# Patient Record
Sex: Female | Born: 1985
Health system: Southern US, Community
[De-identification: ages and names within clinical notes are randomized; demographics above are authoritative.]

## PROBLEM LIST (undated history)

## (undated) DIAGNOSIS — K5909 Other constipation: Secondary | ICD-10-CM

## (undated) DIAGNOSIS — E236 Other disorders of pituitary gland: Secondary | ICD-10-CM

## (undated) DIAGNOSIS — O021 Missed abortion: Secondary | ICD-10-CM

## (undated) DIAGNOSIS — T753XXA Motion sickness, initial encounter: Secondary | ICD-10-CM

## (undated) DIAGNOSIS — E559 Vitamin D deficiency, unspecified: Secondary | ICD-10-CM

## (undated) DIAGNOSIS — R109 Unspecified abdominal pain: Secondary | ICD-10-CM

## (undated) DIAGNOSIS — G8929 Other chronic pain: Secondary | ICD-10-CM

## (undated) DIAGNOSIS — D219 Benign neoplasm of connective and other soft tissue, unspecified: Secondary | ICD-10-CM

## (undated) DIAGNOSIS — E669 Obesity, unspecified: Secondary | ICD-10-CM

## (undated) DIAGNOSIS — R519 Headache, unspecified: Secondary | ICD-10-CM

## (undated) DIAGNOSIS — N840 Polyp of corpus uteri: Secondary | ICD-10-CM

## (undated) DIAGNOSIS — E538 Deficiency of other specified B group vitamins: Secondary | ICD-10-CM

## (undated) DIAGNOSIS — K589 Irritable bowel syndrome without diarrhea: Secondary | ICD-10-CM

## (undated) DIAGNOSIS — R51 Headache: Secondary | ICD-10-CM

## (undated) DIAGNOSIS — Z6841 Body Mass Index (BMI) 40.0 and over, adult: Secondary | ICD-10-CM

## (undated) DIAGNOSIS — M549 Dorsalgia, unspecified: Secondary | ICD-10-CM

## (undated) HISTORY — PX: COLONOSCOPY: SHX174

## (undated) HISTORY — DX: Obesity, unspecified: E66.9

## (undated) HISTORY — DX: Unspecified abdominal pain: R10.9

## (undated) HISTORY — DX: Benign neoplasm of connective and other soft tissue, unspecified: D21.9

## (undated) HISTORY — DX: Other chronic pain: G89.29

## (undated) HISTORY — DX: Missed abortion: O02.1

## (undated) HISTORY — DX: Irritable bowel syndrome, unspecified: K58.9

## (undated) HISTORY — PX: TONSILLECTOMY: SUR1361

## (undated) HISTORY — DX: Other constipation: K59.09

---

## 2005-12-23 ENCOUNTER — Emergency Department: Payer: Self-pay | Admitting: Emergency Medicine

## 2009-07-21 ENCOUNTER — Emergency Department: Payer: Self-pay | Admitting: Emergency Medicine

## 2010-04-25 ENCOUNTER — Ambulatory Visit: Payer: Self-pay | Admitting: Internal Medicine

## 2011-06-27 ENCOUNTER — Ambulatory Visit: Payer: Self-pay | Admitting: Internal Medicine

## 2011-11-20 ENCOUNTER — Ambulatory Visit: Payer: Self-pay

## 2013-09-18 HISTORY — PX: TONSILLECTOMY: SUR1361

## 2013-11-06 ENCOUNTER — Ambulatory Visit: Payer: Self-pay | Admitting: Internal Medicine

## 2013-12-18 DIAGNOSIS — M7750 Other enthesopathy of unspecified foot: Secondary | ICD-10-CM | POA: Insufficient documentation

## 2013-12-18 DIAGNOSIS — M722 Plantar fascial fibromatosis: Secondary | ICD-10-CM | POA: Insufficient documentation

## 2013-12-18 DIAGNOSIS — M79673 Pain in unspecified foot: Secondary | ICD-10-CM | POA: Insufficient documentation

## 2014-02-19 DIAGNOSIS — R519 Headache, unspecified: Secondary | ICD-10-CM | POA: Insufficient documentation

## 2014-02-19 DIAGNOSIS — E236 Other disorders of pituitary gland: Secondary | ICD-10-CM | POA: Insufficient documentation

## 2014-02-19 DIAGNOSIS — R51 Headache: Secondary | ICD-10-CM

## 2014-02-19 DIAGNOSIS — E237 Disorder of pituitary gland, unspecified: Secondary | ICD-10-CM | POA: Insufficient documentation

## 2014-03-13 ENCOUNTER — Ambulatory Visit: Payer: Self-pay | Admitting: Unknown Physician Specialty

## 2014-04-29 DIAGNOSIS — R5383 Other fatigue: Secondary | ICD-10-CM | POA: Insufficient documentation

## 2014-04-29 DIAGNOSIS — E236 Other disorders of pituitary gland: Secondary | ICD-10-CM | POA: Insufficient documentation

## 2014-04-29 DIAGNOSIS — E221 Hyperprolactinemia: Secondary | ICD-10-CM | POA: Insufficient documentation

## 2014-04-29 DIAGNOSIS — D649 Anemia, unspecified: Secondary | ICD-10-CM | POA: Insufficient documentation

## 2014-04-29 DIAGNOSIS — E538 Deficiency of other specified B group vitamins: Secondary | ICD-10-CM | POA: Insufficient documentation

## 2014-10-06 DIAGNOSIS — J019 Acute sinusitis, unspecified: Secondary | ICD-10-CM | POA: Diagnosis not present

## 2014-10-06 DIAGNOSIS — E559 Vitamin D deficiency, unspecified: Secondary | ICD-10-CM | POA: Diagnosis not present

## 2014-10-06 DIAGNOSIS — D519 Vitamin B12 deficiency anemia, unspecified: Secondary | ICD-10-CM | POA: Diagnosis not present

## 2014-10-29 DIAGNOSIS — E236 Other disorders of pituitary gland: Secondary | ICD-10-CM | POA: Diagnosis not present

## 2014-10-29 DIAGNOSIS — E237 Disorder of pituitary gland, unspecified: Secondary | ICD-10-CM | POA: Diagnosis not present

## 2014-12-22 DIAGNOSIS — D519 Vitamin B12 deficiency anemia, unspecified: Secondary | ICD-10-CM | POA: Diagnosis not present

## 2014-12-22 DIAGNOSIS — M545 Low back pain: Secondary | ICD-10-CM | POA: Diagnosis not present

## 2014-12-22 DIAGNOSIS — N390 Urinary tract infection, site not specified: Secondary | ICD-10-CM | POA: Diagnosis not present

## 2014-12-22 DIAGNOSIS — R5383 Other fatigue: Secondary | ICD-10-CM | POA: Diagnosis not present

## 2014-12-22 DIAGNOSIS — E559 Vitamin D deficiency, unspecified: Secondary | ICD-10-CM | POA: Diagnosis not present

## 2014-12-22 DIAGNOSIS — R7301 Impaired fasting glucose: Secondary | ICD-10-CM | POA: Diagnosis not present

## 2015-03-25 DIAGNOSIS — Z113 Encounter for screening for infections with a predominantly sexual mode of transmission: Secondary | ICD-10-CM | POA: Diagnosis not present

## 2015-03-25 DIAGNOSIS — B9689 Other specified bacterial agents as the cause of diseases classified elsewhere: Secondary | ICD-10-CM | POA: Diagnosis not present

## 2015-03-25 DIAGNOSIS — N760 Acute vaginitis: Secondary | ICD-10-CM | POA: Diagnosis not present

## 2015-03-26 DIAGNOSIS — R10817 Generalized abdominal tenderness: Secondary | ICD-10-CM | POA: Diagnosis not present

## 2015-03-26 DIAGNOSIS — N390 Urinary tract infection, site not specified: Secondary | ICD-10-CM | POA: Diagnosis not present

## 2015-03-26 DIAGNOSIS — N760 Acute vaginitis: Secondary | ICD-10-CM | POA: Diagnosis not present

## 2015-05-17 DIAGNOSIS — D352 Benign neoplasm of pituitary gland: Secondary | ICD-10-CM | POA: Diagnosis not present

## 2015-05-17 DIAGNOSIS — M5442 Lumbago with sciatica, left side: Secondary | ICD-10-CM | POA: Diagnosis not present

## 2015-05-17 DIAGNOSIS — M5432 Sciatica, left side: Secondary | ICD-10-CM | POA: Diagnosis not present

## 2015-05-18 ENCOUNTER — Other Ambulatory Visit: Payer: Self-pay | Admitting: Nurse Practitioner

## 2015-05-18 ENCOUNTER — Ambulatory Visit
Admission: RE | Admit: 2015-05-18 | Discharge: 2015-05-18 | Disposition: A | Discharge status: Home / Self Care | Payer: Medicare Other | Source: Ambulatory Visit | Attending: Nurse Practitioner | Admitting: Nurse Practitioner

## 2015-05-18 DIAGNOSIS — M549 Dorsalgia, unspecified: Secondary | ICD-10-CM

## 2015-05-18 DIAGNOSIS — M4050 Lordosis, unspecified, site unspecified: Secondary | ICD-10-CM | POA: Diagnosis not present

## 2015-05-18 DIAGNOSIS — R5383 Other fatigue: Secondary | ICD-10-CM | POA: Diagnosis not present

## 2015-05-18 DIAGNOSIS — E559 Vitamin D deficiency, unspecified: Secondary | ICD-10-CM | POA: Diagnosis not present

## 2015-05-18 DIAGNOSIS — M545 Low back pain: Secondary | ICD-10-CM | POA: Diagnosis not present

## 2015-05-18 DIAGNOSIS — D519 Vitamin B12 deficiency anemia, unspecified: Secondary | ICD-10-CM | POA: Diagnosis not present

## 2015-06-15 DIAGNOSIS — E559 Vitamin D deficiency, unspecified: Secondary | ICD-10-CM | POA: Diagnosis not present

## 2015-06-15 DIAGNOSIS — D519 Vitamin B12 deficiency anemia, unspecified: Secondary | ICD-10-CM | POA: Diagnosis not present

## 2015-06-15 DIAGNOSIS — M545 Low back pain: Secondary | ICD-10-CM | POA: Diagnosis not present

## 2015-06-15 DIAGNOSIS — M5442 Lumbago with sciatica, left side: Secondary | ICD-10-CM | POA: Diagnosis not present

## 2015-06-15 DIAGNOSIS — Z23 Encounter for immunization: Secondary | ICD-10-CM | POA: Diagnosis not present

## 2015-06-16 DIAGNOSIS — Z113 Encounter for screening for infections with a predominantly sexual mode of transmission: Secondary | ICD-10-CM | POA: Diagnosis not present

## 2015-06-16 DIAGNOSIS — Z131 Encounter for screening for diabetes mellitus: Secondary | ICD-10-CM | POA: Diagnosis not present

## 2015-06-16 DIAGNOSIS — Z01419 Encounter for gynecological examination (general) (routine) without abnormal findings: Secondary | ICD-10-CM | POA: Diagnosis not present

## 2015-06-16 DIAGNOSIS — N9089 Other specified noninflammatory disorders of vulva and perineum: Secondary | ICD-10-CM | POA: Diagnosis not present

## 2015-06-16 DIAGNOSIS — Z13220 Encounter for screening for lipoid disorders: Secondary | ICD-10-CM | POA: Diagnosis not present

## 2015-06-16 LAB — HM PAP SMEAR

## 2015-07-20 DIAGNOSIS — J019 Acute sinusitis, unspecified: Secondary | ICD-10-CM | POA: Diagnosis not present

## 2015-07-20 DIAGNOSIS — R05 Cough: Secondary | ICD-10-CM | POA: Diagnosis not present

## 2015-07-20 DIAGNOSIS — D519 Vitamin B12 deficiency anemia, unspecified: Secondary | ICD-10-CM | POA: Diagnosis not present

## 2015-07-20 DIAGNOSIS — Z0001 Encounter for general adult medical examination with abnormal findings: Secondary | ICD-10-CM | POA: Diagnosis not present

## 2015-09-13 ENCOUNTER — Emergency Department
Admission: EM | Admit: 2015-09-13 | Discharge: 2015-09-13 | Disposition: A | Discharge status: Home / Self Care | Payer: Medicare Other | Attending: Emergency Medicine | Admitting: Emergency Medicine

## 2015-09-13 ENCOUNTER — Encounter: Payer: Self-pay | Admitting: Emergency Medicine

## 2015-09-13 DIAGNOSIS — K0889 Other specified disorders of teeth and supporting structures: Secondary | ICD-10-CM | POA: Insufficient documentation

## 2015-09-13 DIAGNOSIS — K029 Dental caries, unspecified: Secondary | ICD-10-CM | POA: Diagnosis not present

## 2015-09-13 MED ORDER — IBUPROFEN 800 MG PO TABS
800.0000 mg | ORAL_TABLET | Freq: Once | ORAL | Status: AC
  Administered 2015-09-13: 800 mg via ORAL
  Filled 2015-09-13: qty 1

## 2015-09-13 MED ORDER — AMOXICILLIN 500 MG PO CAPS: 500.0000 mg | ORAL_CAPSULE | Freq: Three times a day (TID) | ORAL | Status: DC

## 2015-09-13 MED ORDER — TRAMADOL HCL 50 MG PO TABS
50.0000 mg | ORAL_TABLET | Freq: Once | ORAL | Status: AC
  Administered 2015-09-13: 50 mg via ORAL
  Filled 2015-09-13: qty 1

## 2015-09-13 MED ORDER — OXYCODONE-ACETAMINOPHEN 5-325 MG PO TABS: 1.0000 | ORAL_TABLET | Freq: Four times a day (QID) | ORAL | Status: DC | PRN

## 2015-09-13 MED ORDER — IBUPROFEN 800 MG PO TABS: 800.0000 mg | ORAL_TABLET | Freq: Three times a day (TID) | ORAL | Status: DC | PRN

## 2015-09-13 NOTE — ED Notes (Signed)
Pt reports toothache to left upper jaw since last pm.

## 2015-09-13 NOTE — ED Notes (Signed)
Pt has left upper tooth pain.   Sx began yesterday.  Pt has swelling to left side of face.  No resp distress.

## 2015-09-13 NOTE — ED Provider Notes (Signed)
[facility] Emergency Department Provider Note  ____________________________________________  Time seen: Approximately 3:19 PM  I have reviewed the triage vital signs and the nursing notes.   HISTORY  Chief Complaint Dental Pain    HPI Shannon Foley is a 29 y.o. female patient complaining of dental pain which started yesterday. She awakened this morning no some mild swelling to the left side of her face. She states she called her dentist received a recorded message saying they were not open up to 09/20/2015. Patient rated her pain discomfort as a 7/10. No palliative measures taken for this complaint.  No past medical history on file.  There are no active problems to display for this patient.   No past surgical history on file.  Current Outpatient Rx  Name  Route  Sig  Dispense  Refill  . amoxicillin (AMOXIL) 500 MG capsule   Oral   Take 1 capsule (500 mg total) by mouth 3 (three) times daily.   30 capsule   0   . ibuprofen (ADVIL,MOTRIN) 800 MG tablet   Oral   Take 1 tablet (800 mg total) by mouth every 8 (eight) hours as needed for moderate pain.   15 tablet   0   . oxyCODONE-acetaminophen (ROXICET) 5-325 MG tablet   Oral   Take 1 tablet by mouth every 6 (six) hours as needed for moderate pain.   12 tablet   0     Allergies Review of patient's allergies indicates no known allergies.  No family history on file.  Social History Social History  Substance Use Topics  . Smoking status: Never Smoker   . Smokeless tobacco: Not on file  . Alcohol Use: No    Review of Systems Constitutional: No fever/chills Eyes: No visual changes. ENT: No sore throat. Dental pain Cardiovascular: Denies chest pain. Respiratory: Denies shortness of breath. Gastrointestinal: No abdominal pain.  No nausea, no vomiting.  No diarrhea.  No constipation. Genitourinary: Negative for dysuria. Musculoskeletal: Negative for back pain. Skin: Negative for  rash. Neurological: Negative for headaches, focal weakness or numbness. 10-point ROS otherwise negative.  ____________________________________________   PHYSICAL EXAM:  VITAL SIGNS: ED Triage Vitals  Enc Vitals Group     BP 09/13/15 1442 132/90 mmHg     Pulse Rate 09/13/15 1442 84     Resp 09/13/15 1442 20     Temp 09/13/15 1442 98 F (36.7 C)     Temp Source 09/13/15 1442 Oral     SpO2 09/13/15 1442 100 %     Weight 09/13/15 1442 210 lb (95.255 kg)     Height 09/13/15 1442 5\' 2"  (1.575 m)     Head Cir --      Peak Flow --      Pain Score 09/13/15 1443 7     Pain Loc --      Pain Edu? --      Excl. in [place]? --     Constitutional: Alert and oriented. Well appearing and in no acute distress. Eyes: Conjunctivae are normal. PERRL. EOMI. Head: Atraumatic. Nose: No congestion/rhinnorhea. Mouth/Throat: Mucous membranes are moist.  Oropharynx non-erythematous. Edematous gingiva at tooth #12. Neck: No stridor.  No cervical spine tenderness to palpation. Hematological/Lymphatic/Immunilogical: No cervical lymphadenopathy. Cardiovascular: Normal rate, regular rhythm. Grossly normal heart sounds.  Good peripheral circulation. Respiratory: Normal respiratory effort.  No retractions. Lungs CTAB. Gastrointestinal: Soft and nontender. No distention. No abdominal bruits. No CVA tenderness. Musculoskeletal: No lower extremity tenderness nor edema.  No  joint effusions. Neurologic:  Normal speech and language. No gross focal neurologic deficits are appreciated. No gait instability. Skin:  Skin is warm, dry and intact. No rash noted. Psychiatric: Mood and affect are normal. Speech and behavior are normal.  ____________________________________________   LABS (all labs ordered are listed, but only abnormal results are displayed)  Labs Reviewed - No data to  display ____________________________________________  EKG   ____________________________________________  RADIOLOGY   ____________________________________________   PROCEDURES  Procedure(s) performed: None  Critical Care performed: No  ____________________________________________   INITIAL IMPRESSION / ASSESSMENT AND PLAN / ED COURSE  Pertinent labs & imaging results that were available during my care of the patient were reviewed by me and considered in my medical decision making (see chart for details).  Dental pain left upper incisor. Patient will follow-up with her dentist next week. Patient given a prescription for amoxicillin, tramadol and ibuprofen. Patient given a work note for today.  ____________________________________________   FINAL CLINICAL IMPRESSION(S) / ED DIAGNOSES  Final diagnoses:  Pain due to dental caries      Sable Feil, PA-C 09/13/15 1536  Lavonia Drafts, MD 09/14/15 [phone]

## 2015-09-13 NOTE — Discharge Instructions (Signed)
Dental Caries °Dental caries is tooth decay. This decay can cause a hole in teeth (cavity) that can get bigger and deeper over time. °HOME CARE °· Brush and floss your teeth. Do this at least two times a day. °· Use a fluoride toothpaste. °· Use a mouth rinse if told by your dentist or doctor. °· Eat less sugary and starchy foods. Drink less sugary drinks. °· Avoid snacking often on sugary and starchy foods. Avoid sipping often on sugary drinks. °· Keep regular checkups and cleanings with your dentist. °· Use fluoride supplements if told by your dentist or doctor. °· Allow fluoride to be applied to teeth if told by your dentist or doctor. °  °This information is not intended to replace advice given to you by your health care provider. Make sure you discuss any questions you have with your health care provider. °  °Document Released: 06/13/2008 Document Revised: 09/25/2014 Document Reviewed: 09/06/2012 °Elsevier Interactive Patient Education ©2016 [employer] ° °

## 2015-09-19 HISTORY — PX: TONSILLECTOMY: SUR1361

## 2015-09-21 DIAGNOSIS — J069 Acute upper respiratory infection, unspecified: Secondary | ICD-10-CM | POA: Diagnosis not present

## 2015-10-12 DIAGNOSIS — R109 Unspecified abdominal pain: Secondary | ICD-10-CM | POA: Diagnosis not present

## 2015-10-12 DIAGNOSIS — D352 Benign neoplasm of pituitary gland: Secondary | ICD-10-CM | POA: Diagnosis not present

## 2015-10-12 DIAGNOSIS — N390 Urinary tract infection, site not specified: Secondary | ICD-10-CM | POA: Diagnosis not present

## 2015-11-10 DIAGNOSIS — R7301 Impaired fasting glucose: Secondary | ICD-10-CM | POA: Diagnosis not present

## 2015-11-10 DIAGNOSIS — N390 Urinary tract infection, site not specified: Secondary | ICD-10-CM | POA: Diagnosis not present

## 2015-11-10 DIAGNOSIS — R10816 Epigastric abdominal tenderness: Secondary | ICD-10-CM | POA: Diagnosis not present

## 2015-11-10 DIAGNOSIS — J1089 Influenza due to other identified influenza virus with other manifestations: Secondary | ICD-10-CM | POA: Diagnosis not present

## 2015-11-11 DIAGNOSIS — R10817 Generalized abdominal tenderness: Secondary | ICD-10-CM | POA: Diagnosis not present

## 2015-11-11 DIAGNOSIS — R7301 Impaired fasting glucose: Secondary | ICD-10-CM | POA: Diagnosis not present

## 2015-11-17 DIAGNOSIS — R10816 Epigastric abdominal tenderness: Secondary | ICD-10-CM | POA: Diagnosis not present

## 2015-12-24 DIAGNOSIS — R5383 Other fatigue: Secondary | ICD-10-CM | POA: Diagnosis not present

## 2015-12-24 DIAGNOSIS — D352 Benign neoplasm of pituitary gland: Secondary | ICD-10-CM | POA: Diagnosis not present

## 2015-12-24 DIAGNOSIS — E237 Disorder of pituitary gland, unspecified: Secondary | ICD-10-CM | POA: Diagnosis not present

## 2015-12-24 DIAGNOSIS — Z79899 Other long term (current) drug therapy: Secondary | ICD-10-CM | POA: Diagnosis not present

## 2015-12-24 DIAGNOSIS — E236 Other disorders of pituitary gland: Secondary | ICD-10-CM | POA: Diagnosis not present

## 2015-12-25 ENCOUNTER — Encounter: Payer: Self-pay | Admitting: Medical Oncology

## 2015-12-25 ENCOUNTER — Emergency Department
Admission: EM | Admit: 2015-12-25 | Discharge: 2015-12-25 | Disposition: A | Discharge status: Home / Self Care | Payer: Medicare Other | Attending: Emergency Medicine | Admitting: Emergency Medicine

## 2015-12-25 DIAGNOSIS — R1084 Generalized abdominal pain: Secondary | ICD-10-CM

## 2015-12-25 DIAGNOSIS — R197 Diarrhea, unspecified: Secondary | ICD-10-CM | POA: Insufficient documentation

## 2015-12-25 HISTORY — DX: Other disorders of pituitary gland: E23.6

## 2015-12-25 LAB — COMPREHENSIVE METABOLIC PANEL
ALK PHOS: 78 U/L (ref 38–126)
ALT: 10 U/L — ABNORMAL LOW (ref 14–54)
ANION GAP: 6 (ref 5–15)
AST: 21 U/L (ref 15–41)
Albumin: 4.2 g/dL (ref 3.5–5.0)
BILIRUBIN TOTAL: 0.5 mg/dL (ref 0.3–1.2)
BUN: 10 mg/dL (ref 6–20)
CALCIUM: 8.4 mg/dL — AB (ref 8.9–10.3)
CO2: 22 mmol/L (ref 22–32)
Chloride: 101 mmol/L (ref 101–111)
Creatinine, Ser: 0.71 mg/dL (ref 0.44–1.00)
Glucose, Bld: 134 mg/dL — ABNORMAL HIGH (ref 65–99)
Potassium: 3.8 mmol/L (ref 3.5–5.1)
Sodium: 129 mmol/L — ABNORMAL LOW (ref 135–145)
TOTAL PROTEIN: 8.1 g/dL (ref 6.5–8.1)

## 2015-12-25 LAB — CBC
HCT: 43.2 % (ref 35.0–47.0)
HEMOGLOBIN: 14.7 g/dL (ref 12.0–16.0)
MCH: 30.5 pg (ref 26.0–34.0)
MCHC: 34 g/dL (ref 32.0–36.0)
MCV: 89.8 fL (ref 80.0–100.0)
Platelets: 369 10*3/uL (ref 150–440)
RBC: 4.81 MIL/uL (ref 3.80–5.20)
RDW: 13.9 % (ref 11.5–14.5)
WBC: 11.4 10*3/uL — AB (ref 3.6–11.0)

## 2015-12-25 LAB — URINALYSIS COMPLETE WITH MICROSCOPIC (ARMC ONLY)
Bacteria, UA: NONE SEEN
Bilirubin Urine: NEGATIVE
Glucose, UA: NEGATIVE mg/dL
Hgb urine dipstick: NEGATIVE
KETONES UR: NEGATIVE mg/dL
NITRITE: NEGATIVE
PROTEIN: NEGATIVE mg/dL
SPECIFIC GRAVITY, URINE: 1.019 (ref 1.005–1.030)
pH: 6 (ref 5.0–8.0)

## 2015-12-25 LAB — POCT PREGNANCY, URINE: Preg Test, Ur: NEGATIVE

## 2015-12-25 LAB — LIPASE, BLOOD: Lipase: 15 U/L (ref 11–51)

## 2015-12-25 MED ORDER — DICYCLOMINE HCL 20 MG PO TABS: 20.0000 mg | ORAL_TABLET | Freq: Three times a day (TID) | ORAL | Status: DC | PRN

## 2015-12-25 MED ORDER — RANITIDINE HCL 150 MG PO CAPS: 150.0000 mg | ORAL_CAPSULE | Freq: Two times a day (BID) | ORAL | Status: DC

## 2015-12-25 MED ORDER — SUCRALFATE 1 G PO TABS: 1.0000 g | ORAL_TABLET | Freq: Four times a day (QID) | ORAL | Status: DC

## 2015-12-25 NOTE — Discharge Instructions (Signed)
Abdominal Pain, Adult °Many things can cause abdominal pain. Usually, abdominal pain is not caused by a disease and will improve without treatment. It can often be observed and treated at home. Your health care provider will do a physical exam and possibly order blood tests and X-rays to help determine the seriousness of your pain. However, in many cases, more time must pass before a clear cause of the pain can be found. Before that point, your health care provider may not know if you need more testing or further treatment. °HOME CARE INSTRUCTIONS °Monitor your abdominal pain for any changes. The following actions may help to alleviate any discomfort you are experiencing: °· Only take over-the-counter or prescription medicines as directed by your health care provider. °· Do not take laxatives unless directed to do so by your health care provider. °· Try a clear liquid diet (broth, tea, or water) as directed by your health care provider. Slowly move to a bland diet as tolerated. °SEEK MEDICAL CARE IF: °· You have unexplained abdominal pain. °· You have abdominal pain associated with nausea or diarrhea. °· You have pain when you urinate or have a bowel movement. °· You experience abdominal pain that wakes you in the night. °· You have abdominal pain that is worsened or improved by eating food. °· You have abdominal pain that is worsened with eating fatty foods. °· You have a fever. °SEEK IMMEDIATE MEDICAL CARE IF: °· Your pain does not go away within 2 hours. °· You keep throwing up (vomiting). °· Your pain is felt only in portions of the abdomen, such as the right side or the left lower portion of the abdomen. °· You pass bloody or black tarry stools. °MAKE SURE YOU: °· Understand these instructions. °· Will watch your condition. °· Will get help right away if you are not doing well or get worse. °  °This information is not intended to replace advice given to you by your health care provider. Make sure you discuss  any questions you have with your health care provider. °  °Document Released: 06/14/2005 Document Revised: 05/26/2015 Document Reviewed: 05/14/2013 °Elsevier Interactive Patient Education ©2016 [employer] ° °Diarrhea °Diarrhea is frequent loose and watery bowel movements. It can cause you to feel weak and dehydrated. Dehydration can cause you to become tired and thirsty, have a dry mouth, and have decreased urination that often is dark yellow. Diarrhea is a sign of another problem, most often an infection that will not last long. In most cases, diarrhea typically lasts 2-3 days. However, it can last longer if it is a sign of something more serious. It is important to treat your diarrhea as directed by your caregiver to lessen or prevent future episodes of diarrhea. °CAUSES  °Some common causes include: °· Gastrointestinal infections caused by viruses, bacteria, or parasites. °· Food poisoning or food allergies. °· Certain medicines, such as antibiotics, chemotherapy, and laxatives. °· Artificial sweeteners and fructose. °· Digestive disorders. °HOME CARE INSTRUCTIONS °· Ensure adequate fluid intake (hydration): Have 1 cup (8 oz) of fluid for each diarrhea episode. Avoid fluids that contain simple sugars or sports drinks, fruit juices, whole milk products, and sodas. Your urine should be clear or pale yellow if you are drinking enough fluids. Hydrate with an oral rehydration solution that you can purchase at pharmacies, retail stores, and online. You can prepare an oral rehydration solution at home by mixing the following ingredients together: °¨  - tsp table salt. °¨ ¾ tsp baking soda. °¨    tsp salt substitute containing potassium chloride. °¨ 1  tablespoons sugar. °¨ 1 L (34 oz) of water. °· Certain foods and beverages may increase the speed at which food moves through the gastrointestinal (GI) tract. These foods and beverages should be avoided and include: °¨ Caffeinated and alcoholic beverages. °¨ High-fiber  foods, such as raw fruits and vegetables, nuts, seeds, and whole grain breads and cereals. °¨ Foods and beverages sweetened with sugar alcohols, such as xylitol, sorbitol, and mannitol. °· Some foods may be well tolerated and may help thicken stool including: °¨ Starchy foods, such as rice, toast, pasta, low-sugar cereal, oatmeal, grits, baked potatoes, crackers, and bagels. °¨ Bananas. °¨ Applesauce. °· Add probiotic-rich foods to help increase healthy bacteria in the GI tract, such as yogurt and fermented milk products. °· Wash your hands well after each diarrhea episode. °· Only take over-the-counter or prescription medicines as directed by your caregiver. °· Take a warm bath to relieve any burning or pain from frequent diarrhea episodes. °SEEK IMMEDIATE MEDICAL CARE IF:  °· You are unable to keep fluids down. °· You have persistent vomiting. °· You have blood in your stool, or your stools are black and tarry. °· You do not urinate in 6-8 hours, or there is only a small amount of very dark urine. °· You have abdominal pain that increases or localizes. °· You have weakness, dizziness, confusion, or light-headedness. °· You have a severe headache. °· Your diarrhea gets worse or does not get better. °· You have a fever or persistent symptoms for more than 2-3 days. °· You have a fever and your symptoms suddenly get worse. °MAKE SURE YOU:  °· Understand these instructions. °· Will watch your condition. °· Will get help right away if you are not doing well or get worse. °  °This information is not intended to replace advice given to you by your health care provider. Make sure you discuss any questions you have with your health care provider. °  °Document Released: 08/25/2002 Document Revised: 09/25/2014 Document Reviewed: 05/12/2012 °Elsevier Interactive Patient Education ©2016 [employer] Inc. ° °

## 2015-12-25 NOTE — ED Notes (Signed)
Pt reports upper abd pain that began around 0400 this am, pt also reports diarrhea.

## 2015-12-25 NOTE — ED Provider Notes (Signed)
[facility] Emergency Department Provider Note  ____________________________________________  Time seen: 1205 PM  I have reviewed the triage vital signs and the nursing notes.   HISTORY  Chief Complaint Abdominal Pain and Diarrhea    HPI Shannon Foley is a 30 y.o. female who was in her usual state of health last night at bedtime when she woke up this morning around 4:00 AM with generalized abdominal pain. He is been constant and steady since that time. Radiating to the lower back. No nausea vomiting dysuria frequency urgency. Has had several loose bowel movements today. Able to eat and drink normally. No fatigue. No chest pain shortness of breath fevers chills dizziness. No cough or runny nose. Has recently been exposed to a child who had influenza.     Past Medical History  Diagnosis Date  . Pituitary cyst ([place])      There are no active problems to display for this patient.    Past Surgical History  Procedure Laterality Date  . Tonsillectomy       Current Outpatient Rx  Name  Route  Sig  Dispense  Refill  . amoxicillin (AMOXIL) 500 MG capsule   Oral   Take 1 capsule (500 mg total) by mouth 3 (three) times daily.   30 capsule   0   . dicyclomine (BENTYL) 20 MG tablet   Oral   Take 1 tablet (20 mg total) by mouth 3 (three) times daily as needed for spasms.   30 tablet   0   . ibuprofen (ADVIL,MOTRIN) 800 MG tablet   Oral   Take 1 tablet (800 mg total) by mouth every 8 (eight) hours as needed for moderate pain.   15 tablet   0   . oxyCODONE-acetaminophen (ROXICET) 5-325 MG tablet   Oral   Take 1 tablet by mouth every 6 (six) hours as needed for moderate pain.   12 tablet   0   . ranitidine (ZANTAC) 150 MG capsule   Oral   Take 1 capsule (150 mg total) by mouth 2 (two) times daily.   28 capsule   0   . sucralfate (CARAFATE) 1 g tablet   Oral   Take 1 tablet (1 g total) by mouth 4 (four) times daily.   120 tablet   1       Allergies Review of patient's allergies indicates no known allergies.   No family history on file.  Social History Social History  Substance Use Topics  . Smoking status: Never Smoker   . Smokeless tobacco: None  . Alcohol Use: No    Review of Systems  Constitutional:   No fever or chills. No weight changes Eyes:   No vision changes.  ENT:   No sore throat. No rhinorrhea. Cardiovascular:   No chest pain. Respiratory:   No dyspnea or cough. Gastrointestinal:   Generalized abdominal pain with diarrhea. No vomiting.Marland Kitchen  No BRBPR or melena. Genitourinary:   Negative for dysuria or difficulty urinating. Musculoskeletal:   Negative for focal pain or swelling Skin:   Negative for rash. Neurological:   Negative for headaches, focal weakness or numbness.  10-point ROS otherwise negative.  ____________________________________________   PHYSICAL EXAM:  VITAL SIGNS: ED Triage Vitals  Enc Vitals Group     BP 12/25/15 0953 128/78 mmHg     Pulse Rate 12/25/15 0953 99     Resp 12/25/15 0953 16     Temp 12/25/15 0953 98.2 F (36.8 C)  Temp Source 12/25/15 0953 Oral     SpO2 12/25/15 0953 100 %     Weight 12/25/15 0954 223 lb (101.152 kg)     Height 12/25/15 0954 5\' 2"  (1.575 m)     Head Cir --      Peak Flow --      Pain Score 12/25/15 0955 10     Pain Loc --      Pain Edu? --      Excl. in [place]? --     Vital signs reviewed, nursing assessments reviewed.   Constitutional:   Alert and oriented. Well appearing and in no distress. Eyes:   No scleral icterus. No conjunctival pallor. PERRL. EOMI ENT   Head:   Normocephalic and atraumatic.   Nose:   No congestion/rhinnorhea. No septal hematoma   Mouth/Throat:   MMM, mild pharyngeal erythema. No peritonsillar mass.    Neck:   No stridor. No SubQ emphysema. No meningismus. Hematological/Lymphatic/Immunilogical:   No cervical lymphadenopathy. Cardiovascular:   RRR. Symmetric bilateral radial and DP pulses.   No murmurs.  Respiratory:   Normal respiratory effort without tachypnea nor retractions. Breath sounds are clear and equal bilaterally. No wheezes/rales/rhonchi. Gastrointestinal:   Soft with mild generalized tenderness. Non distended. There is no CVA tenderness.  No rebound, rigidity, or guarding. Genitourinary:   deferred Musculoskeletal:   Nontender with normal range of motion in all extremities. No joint effusions.  No lower extremity tenderness.  No edema. There is some mild soft tissue tenderness in the right flank Neurologic:   Normal speech and language.  CN 2-10 normal. Motor grossly intact. No gross focal neurologic deficits are appreciated.  Skin:    Skin is warm, dry and intact. No rash noted.  No petechiae, purpura, or bullae. Psychiatric:   Mood and affect are normal. ____________________________________________    LABS (pertinent positives/negatives) (all labs ordered are listed, but only abnormal results are displayed) Labs Reviewed  COMPREHENSIVE METABOLIC PANEL - Abnormal; Notable for the following:    Sodium 129 (*)    Glucose, Bld 134 (*)    Calcium 8.4 (*)    ALT 10 (*)    All other components within normal limits  CBC - Abnormal; Notable for the following:    WBC 11.4 (*)    All other components within normal limits  URINALYSIS COMPLETEWITH MICROSCOPIC ([facility] ONLY) - Abnormal; Notable for the following:    Color, Urine YELLOW (*)    APPearance CLOUDY (*)    Leukocytes, UA 2+ (*)    Squamous Epithelial / LPF 6-30 (*)    All other components within normal limits  LIPASE, BLOOD  POC URINE PREG, ED  POCT PREGNANCY, URINE   ____________________________________________   EKG  Interpreted by me Normal sinus rhythm rate of 97, normal axis and intervals. Normal QRS with an incomplete right bundle-branch block. Isolated T-wave inversion in V3 which is nonspecific. Normal ST segments.  ____________________________________________     RADIOLOGY    ____________________________________________   PROCEDURES   ____________________________________________   INITIAL IMPRESSION / ASSESSMENT AND PLAN / ED COURSE  Pertinent labs & imaging results that were available during my care of the patient were reviewed by me and considered in my medical decision making (see chart for details).  Patient presents with abdominal pain and loose bowel movements for about 8 hours so far today. His exposure to child with viral illness. Well-appearing no acute distress, vital signs unremarkable, exam is nonfocal and reassuring. The very mild hyponatremia of 129  as well as a white blood cell count of 11 are all consistent with viral GI infection. These can sometimes be seen in some bacterial GI infections as well, but with her normal vital signs and nontoxic appearance, I think this is less likely. Start the patient on Bentyl Carafate and Zantac and have him follow up with primary care.     ____________________________________________   FINAL CLINICAL IMPRESSION(S) / ED DIAGNOSES  Final diagnoses:  Generalized abdominal pain  Diarrhea, unspecified type      Carrie Mew, MD 12/25/15 1233

## 2015-12-29 DIAGNOSIS — E860 Dehydration: Secondary | ICD-10-CM | POA: Diagnosis not present

## 2015-12-29 DIAGNOSIS — N390 Urinary tract infection, site not specified: Secondary | ICD-10-CM | POA: Diagnosis not present

## 2015-12-29 DIAGNOSIS — R10817 Generalized abdominal tenderness: Secondary | ICD-10-CM | POA: Diagnosis not present

## 2015-12-29 DIAGNOSIS — R4922 Hyponasality: Secondary | ICD-10-CM | POA: Diagnosis not present

## 2015-12-30 ENCOUNTER — Other Ambulatory Visit: Payer: Self-pay | Admitting: Nurse Practitioner

## 2015-12-30 DIAGNOSIS — R109 Unspecified abdominal pain: Secondary | ICD-10-CM

## 2016-01-04 ENCOUNTER — Ambulatory Visit
Admission: RE | Admit: 2016-01-04 | Discharge: 2016-01-04 | Disposition: A | Discharge status: Home / Self Care | Payer: Medicare Other | Source: Ambulatory Visit | Attending: Nurse Practitioner | Admitting: Nurse Practitioner

## 2016-01-04 DIAGNOSIS — R109 Unspecified abdominal pain: Secondary | ICD-10-CM

## 2016-01-04 DIAGNOSIS — D509 Iron deficiency anemia, unspecified: Secondary | ICD-10-CM | POA: Diagnosis not present

## 2016-01-04 DIAGNOSIS — E559 Vitamin D deficiency, unspecified: Secondary | ICD-10-CM | POA: Diagnosis not present

## 2016-01-04 DIAGNOSIS — K5900 Constipation, unspecified: Secondary | ICD-10-CM | POA: Insufficient documentation

## 2016-01-04 DIAGNOSIS — E860 Dehydration: Secondary | ICD-10-CM | POA: Diagnosis not present

## 2016-01-04 DIAGNOSIS — D259 Leiomyoma of uterus, unspecified: Secondary | ICD-10-CM | POA: Insufficient documentation

## 2016-01-04 DIAGNOSIS — R4922 Hyponasality: Secondary | ICD-10-CM | POA: Diagnosis not present

## 2016-01-04 DIAGNOSIS — R1012 Left upper quadrant pain: Secondary | ICD-10-CM | POA: Diagnosis not present

## 2016-01-04 DIAGNOSIS — R933 Abnormal findings on diagnostic imaging of other parts of digestive tract: Secondary | ICD-10-CM | POA: Diagnosis not present

## 2016-01-04 MED ORDER — IOPAMIDOL (ISOVUE-370) INJECTION 76%
100.0000 mL | Freq: Once | INTRAVENOUS | Status: AC | PRN
  Administered 2016-01-04: 100 mL via INTRAVENOUS

## 2016-01-17 DIAGNOSIS — E559 Vitamin D deficiency, unspecified: Secondary | ICD-10-CM | POA: Diagnosis not present

## 2016-01-17 DIAGNOSIS — N390 Urinary tract infection, site not specified: Secondary | ICD-10-CM | POA: Diagnosis not present

## 2016-01-17 DIAGNOSIS — K279 Peptic ulcer, site unspecified, unspecified as acute or chronic, without hemorrhage or perforation: Secondary | ICD-10-CM | POA: Diagnosis not present

## 2016-01-17 DIAGNOSIS — N760 Acute vaginitis: Secondary | ICD-10-CM | POA: Diagnosis not present

## 2016-02-02 ENCOUNTER — Other Ambulatory Visit: Payer: Self-pay

## 2016-02-02 DIAGNOSIS — E236 Other disorders of pituitary gland: Secondary | ICD-10-CM | POA: Insufficient documentation

## 2016-02-03 ENCOUNTER — Encounter (INDEPENDENT_AMBULATORY_CARE_PROVIDER_SITE_OTHER): Payer: Self-pay

## 2016-02-03 ENCOUNTER — Ambulatory Visit (INDEPENDENT_AMBULATORY_CARE_PROVIDER_SITE_OTHER): Payer: Self-pay | Admitting: Gastroenterology

## 2016-02-03 ENCOUNTER — Encounter: Payer: Self-pay | Admitting: Gastroenterology

## 2016-02-03 VITALS — BP 130/68 | HR 81 | Temp 98.5°F | Ht 62.0 in | Wt 216.0 lb

## 2016-02-03 DIAGNOSIS — R938 Abnormal findings on diagnostic imaging of other specified body structures: Secondary | ICD-10-CM | POA: Diagnosis not present

## 2016-02-03 DIAGNOSIS — R9389 Abnormal findings on diagnostic imaging of other specified body structures: Secondary | ICD-10-CM

## 2016-02-03 DIAGNOSIS — K5901 Slow transit constipation: Secondary | ICD-10-CM | POA: Diagnosis not present

## 2016-02-03 DIAGNOSIS — R935 Abnormal findings on diagnostic imaging of other abdominal regions, including retroperitoneum: Secondary | ICD-10-CM

## 2016-02-03 NOTE — Progress Notes (Signed)
Gastroenterology Consultation  Referring Provider:     [name], MD Primary Care Physician:  [name], MD Primary Gastroenterologist:  Dr. Allen Norris     Reason for Consultation:     Constipation        HPI:   Shannon Foley is a 30 y.o. y/o female referred for consultation & management of Constipation by Dr. Humphrey Rolls, Timoteo Gaul, MD.  This patient comes today with a history of constipation most of her life but she states that his gotten worse in the last 6 months. The patient was started on Linzess at 72 g once a day. The patient took it once and states she had a bowel movement with diarrhea. The patient reports that she took it with food and did not take it as suggested a half hour before she eats. The patient reports that she has a bowel movement every day but it is usually hard to pass. The patient also had imaging of her abdomen that showed her to have duodenitis. The patient has been having epigastric discomfort.  Past Medical History  Diagnosis Date  . Pituitary cyst [facility])     Past Surgical History  Procedure Laterality Date  . Tonsillectomy      Prior to Admission medications   Medication Sig Start Date End Date Taking? Authorizing Provider  cyanocobalamin (,VITAMIN B-12,) 1000 MCG/ML injection  04/14/14  Yes Historical Provider, MD  ibuprofen (ADVIL,MOTRIN) 800 MG tablet Take 1 tablet (800 mg total) by mouth every 8 (eight) hours as needed for moderate pain. 09/13/15  Yes Sable Feil, PA-C  Vitamin D, Ergocalciferol, (DRISDOL) 50000 units CAPS capsule  04/14/14  Yes Historical Provider, MD  amoxicillin (AMOXIL) 500 MG capsule Take 1 capsule (500 mg total) by mouth 3 (three) times daily. Patient not taking: Reported on 02/03/2016 09/13/15   Sable Feil, PA-C  dicyclomine (BENTYL) 20 MG tablet Take 1 tablet (20 mg total) by mouth 3 (three) times daily as needed for spasms. Patient not taking: Reported on 02/03/2016 12/25/15   Carrie Mew, MD  levonorgestrel-ethinyl  estradiol Patty Sermons) 0.1-20 MG-MCG tablet Reported on 02/03/2016 04/07/14   Historical Provider, MD  oxyCODONE-acetaminophen (ROXICET) 5-325 MG tablet Take 1 tablet by mouth every 6 (six) hours as needed for moderate pain. Patient not taking: Reported on 02/03/2016 09/13/15   Sable Feil, PA-C  ranitidine (ZANTAC) 150 MG capsule Take 1 capsule (150 mg total) by mouth 2 (two) times daily. Patient not taking: Reported on 02/03/2016 12/25/15   Carrie Mew, MD  sucralfate (CARAFATE) 1 g tablet Take 1 tablet (1 g total) by mouth 4 (four) times daily. Patient not taking: Reported on 02/03/2016 12/25/15   Carrie Mew, MD    History reviewed. No pertinent family history.   Social History  Substance Use Topics  . Smoking status: Never Smoker   . Smokeless tobacco: Never Used  . Alcohol Use: No    Allergies as of 02/03/2016  . (No Known Allergies)    Review of Systems:    All systems reviewed and negative except where noted in HPI.   Physical Exam:  BP 130/68 mmHg  Pulse 81  Temp(Src) 98.5 F (36.9 C) (Oral)  Ht 5\' 2"  (1.575 m)  Wt 216 lb (97.977 kg)  BMI 39.50 kg/m2  LMP 01/01/2016 Patient's last menstrual period was 01/01/2016. Psych:  Alert and cooperative. Normal mood and affect. General:   Alert,  Well-developed, well-nourished, pleasant and cooperative in NAD Head:  Normocephalic and  atraumatic. Eyes:  Sclera clear, no icterus.   Conjunctiva pink. Ears:  Normal auditory acuity. Nose:  No deformity, discharge, or lesions. Mouth:  No deformity or lesions,oropharynx pink & moist. Neck:  Supple; no masses or thyromegaly. Lungs:  Respirations even and unlabored.  Clear throughout to auscultation.   No wheezes, crackles, or rhonchi. No acute distress. Heart:  Regular rate and rhythm; no murmurs, clicks, rubs, or gallops. Abdomen:  Normal bowel sounds.  No bruits.  Soft, tender in the epigastric area and non-distended without masses, hepatosplenomegaly or hernias  noted.  No guarding or rebound tenderness.  Negative Carnett sign.   Rectal:  Deferred.  Msk:  Symmetrical without gross deformities.  Good, equal movement & strength bilaterally. Pulses:  Normal pulses noted. Extremities:  No clubbing or edema.  No cyanosis. Neurologic:  Alert and oriented x3;  grossly normal neurologically. Skin:  Intact without significant lesions or rashes.  No jaundice. Lymph Nodes:  No significant cervical adenopathy. Psych:  Alert and cooperative. Normal mood and affect.  Imaging Studies: Ct Abdomen Pelvis W Contrast  01/04/2016  CLINICAL DATA:  Intermittent mid abdominal and left upper quadrant pain for 2-3 months with some diarrhea EXAM: CT ABDOMEN AND PELVIS WITH CONTRAST TECHNIQUE: Multidetector CT imaging of the abdomen and pelvis was performed using the standard protocol following bolus administration of intravenous contrast. CONTRAST:  100 cc Isovue 370 COMPARISON:  None. FINDINGS: The lung bases are clear. The liver enhances with only a tiny cyst noted in the dome of the liver medially. No ductal dilatation is seen. No calcified gallstones are noted. The pancreas is normal in size and the pancreatic duct is not dilated. The adrenal glands and spleen are unremarkable. The stomach is filled with contrast and food debris. There may be some slight thickening of the duodenal wall. Is there any clinically suspicion of duodenitis or duodenum ulceration? If so either endoscopy or upper GI would be recommended to assess further. The kidneys enhance with no calculus or mass. No hydronephrosis is seen. The abdominal aorta is normal in caliber. The uterus is slightly lobular most consistent with small uterine fibroids. The urinary bladder is not well distended but appears grossly unremarkable. Small ovarian follicles are present. No free fluid is noted within the pelvis. There is feces throughout the colon. The appendix and terminal ileum are unremarkable. The lumbar vertebrae are in  normal alignment with normal intervertebral disc spaces. IMPRESSION: 1. Questionable thickening of the duodenal wall. Is there any clinical suspicion of duodenitis or duodenal ulceration? If so further assessment with endoscopy or upper GI would be recommended. 2. Lobular uterus consistent with the presence of small uterine fibroids. 3. The appendix and terminal ileum are unremarkable. Electronically Signed   By: Ivar Drape M.D.   On: 01/04/2016 16:31    Assessment and Plan:   Shannon Foley is a 30 y.o. y/o female who comes in today with a finding on a CT scan showing duodenitis as shown by thickening of the duodenal wall. The patient also has chronic constipation that has been worse in the last 6 months. The patient had diarrhea when she took Linzess 72 g once a day. The patient only tried it once because of the diarrhea. The patient has been told to take the medication a half hour before she eats to try and avoid the diarrhea. If that does not work the patient has been given samples of 8 g of Amitiza to try that twice a day to see if  that helps her with her constipation. The patient has been told to contact me if her symptoms do not improve. The patient will also be set up for an upper endoscopy due to the abnormal CT scan. I have discussed risks & benefits which include, but are not limited to, bleeding, infection, perforation & drug reaction.  The patient agrees with this plan & written consent will be obtained.      Note: This dictation was prepared with Dragon dictation along with smaller phrase technology. Any transcriptional errors that result from this process are unintentional.

## 2016-02-04 ENCOUNTER — Other Ambulatory Visit: Payer: Self-pay

## 2016-02-15 ENCOUNTER — Encounter: Payer: Self-pay | Admitting: *Deleted

## 2016-02-16 NOTE — Discharge Instructions (Signed)

## 2016-02-18 ENCOUNTER — Ambulatory Visit: Payer: Medicare Other | Admitting: Anesthesiology

## 2016-02-18 ENCOUNTER — Ambulatory Visit
Admission: RE | Admit: 2016-02-18 | Discharge: 2016-02-18 | Disposition: A | Discharge status: Home / Self Care | Payer: Medicare Other | Source: Ambulatory Visit | Attending: Gastroenterology | Admitting: Gastroenterology

## 2016-02-18 ENCOUNTER — Encounter
Admission: RE | Disposition: A | Discharge status: Home / Self Care | Payer: Self-pay | Source: Ambulatory Visit | Attending: Gastroenterology

## 2016-02-18 DIAGNOSIS — R935 Abnormal findings on diagnostic imaging of other abdominal regions, including retroperitoneum: Secondary | ICD-10-CM | POA: Diagnosis not present

## 2016-02-18 DIAGNOSIS — E236 Other disorders of pituitary gland: Secondary | ICD-10-CM | POA: Insufficient documentation

## 2016-02-18 DIAGNOSIS — Z793 Long term (current) use of hormonal contraceptives: Secondary | ICD-10-CM | POA: Diagnosis not present

## 2016-02-18 DIAGNOSIS — Z79899 Other long term (current) drug therapy: Secondary | ICD-10-CM | POA: Insufficient documentation

## 2016-02-18 DIAGNOSIS — R198 Other specified symptoms and signs involving the digestive system and abdomen: Secondary | ICD-10-CM | POA: Insufficient documentation

## 2016-02-18 DIAGNOSIS — R933 Abnormal findings on diagnostic imaging of other parts of digestive tract: Secondary | ICD-10-CM

## 2016-02-18 DIAGNOSIS — Z791 Long term (current) use of non-steroidal anti-inflammatories (NSAID): Secondary | ICD-10-CM | POA: Insufficient documentation

## 2016-02-18 DIAGNOSIS — Z9889 Other specified postprocedural states: Secondary | ICD-10-CM | POA: Diagnosis not present

## 2016-02-18 HISTORY — PX: ESOPHAGOGASTRODUODENOSCOPY (EGD) WITH PROPOFOL: SHX5813

## 2016-02-18 HISTORY — DX: Motion sickness, initial encounter: T75.3XXA

## 2016-02-18 HISTORY — DX: Headache, unspecified: R51.9

## 2016-02-18 HISTORY — DX: Dorsalgia, unspecified: M54.9

## 2016-02-18 HISTORY — DX: Headache: R51

## 2016-02-18 SURGERY — ESOPHAGOGASTRODUODENOSCOPY (EGD) WITH PROPOFOL
Anesthesia: Monitor Anesthesia Care | Wound class: Clean Contaminated

## 2016-02-18 MED ORDER — LACTATED RINGERS IV SOLN
INTRAVENOUS | Status: DC
  Administered 2016-02-18: 10:00:00 via INTRAVENOUS

## 2016-02-18 MED ORDER — PROPOFOL 10 MG/ML IV BOLUS
INTRAVENOUS | Status: DC | PRN
  Administered 2016-02-18 (×2): 20 mg via INTRAVENOUS
  Administered 2016-02-18: 100 mg via INTRAVENOUS

## 2016-02-18 MED ORDER — GLYCOPYRROLATE 0.2 MG/ML IJ SOLN
INTRAMUSCULAR | Status: DC | PRN
  Administered 2016-02-18: 0.2 mg via INTRAVENOUS

## 2016-02-18 MED ORDER — STERILE WATER FOR IRRIGATION IR SOLN
Status: DC | PRN
  Administered 2016-02-18: 11:00:00

## 2016-02-18 MED ORDER — LIDOCAINE HCL (CARDIAC) 20 MG/ML IV SOLN
INTRAVENOUS | Status: DC | PRN
  Administered 2016-02-18: 40 mg via INTRAVENOUS

## 2016-02-18 SURGICAL SUPPLY — 31 items
BALLN DILATOR 10-12 8 (BALLOONS)
BALLN DILATOR 12-15 8 (BALLOONS)
BALLN DILATOR 15-18 8 (BALLOONS)
BALLN DILATOR CRE 0-12 8 (BALLOONS)
BALLN DILATOR ESOPH 8 10 CRE (MISCELLANEOUS) IMPLANT
BALLOON DILATOR 12-15 8 (BALLOONS) IMPLANT
BALLOON DILATOR 15-18 8 (BALLOONS) IMPLANT
BALLOON DILATOR CRE 0-12 8 (BALLOONS) IMPLANT
BLOCK BITE 60FR ADLT L/F GRN (MISCELLANEOUS) ×3 IMPLANT
CANISTER SUCT 1200ML W/VALVE (MISCELLANEOUS) ×3 IMPLANT
CLIP HMST 235XBRD CATH ROT (MISCELLANEOUS) IMPLANT
CLIP RESOLUTION 360 11X235 (MISCELLANEOUS)
FCP ESCP3.2XJMB 240X2.8X (MISCELLANEOUS)
FORCEPS BIOP RAD 4 LRG CAP 4 (CUTTING FORCEPS) IMPLANT
FORCEPS BIOP RJ4 240 W/NDL (MISCELLANEOUS)
FORCEPS ESCP3.2XJMB 240X2.8X (MISCELLANEOUS) IMPLANT
GOWN CVR UNV OPN BCK APRN NK (MISCELLANEOUS) ×2 IMPLANT
GOWN ISOL THUMB LOOP REG UNIV (MISCELLANEOUS) ×4
INJECTOR VARIJECT VIN23 (MISCELLANEOUS) IMPLANT
KIT DEFENDO VALVE AND CONN (KITS) IMPLANT
KIT ENDO PROCEDURE OLY (KITS) ×3 IMPLANT
MARKER SPOT ENDO TATTOO 5ML (MISCELLANEOUS) IMPLANT
PAD GROUND ADULT SPLIT (MISCELLANEOUS) IMPLANT
SNARE SHORT THROW 13M SML OVAL (MISCELLANEOUS) IMPLANT
SNARE SHORT THROW 30M LRG OVAL (MISCELLANEOUS) IMPLANT
SPOT EX ENDOSCOPIC TATTOO (MISCELLANEOUS)
SYR INFLATION 60ML (SYRINGE) IMPLANT
TRAP ETRAP POLY (MISCELLANEOUS) IMPLANT
VARIJECT INJECTOR VIN23 (MISCELLANEOUS)
WATER STERILE IRR 250ML POUR (IV SOLUTION) ×3 IMPLANT
WIRE CRE 18-20MM 8CM F G (MISCELLANEOUS) IMPLANT

## 2016-02-18 NOTE — Anesthesia Procedure Notes (Signed)
Procedure Name: MAC Performed by: Brittain Smithey Pre-anesthesia Checklist: Patient identified, Emergency Drugs available, Suction available, Patient being monitored and Timeout performed Patient Re-evaluated:Patient Re-evaluated prior to inductionOxygen Delivery Method: Nasal cannula       

## 2016-02-18 NOTE — Transfer of Care (Signed)
Immediate Anesthesia Transfer of Care Note  Patient: Shannon Foley  Procedure(s) Performed: Procedure(s): ESOPHAGOGASTRODUODENOSCOPY (EGD) WITH PROPOFOL (N/A)  Patient Location: PACU  Anesthesia Type: MAC  Level of Consciousness: awake, alert  and patient cooperative  Airway and Oxygen Therapy: Patient Spontanous Breathing and Patient connected to supplemental oxygen  Post-op Assessment: Post-op Vital signs reviewed, Patient's Cardiovascular Status Stable, Respiratory Function Stable, Patent Airway and No signs of Nausea or vomiting  Post-op Vital Signs: Reviewed and stable  Complications: No apparent anesthesia complications

## 2016-02-18 NOTE — H&P (Signed)
  [facility]  [address], [place], [place] [address] Phone: [phone] Fax : [phone]  Primary Care Physician:  Shannon Guise, MD Primary Gastroenterologist:  Dr. Allen Norris  Pre-Procedure History & Physical: HPI:  Shannon Foley is a 30 y.o. female is here for an endoscopy.   Past Medical History  Diagnosis Date  . Pituitary cyst ([place])   . Back pain     mid and lower back  . Headache     caused by pituitary cyst  . Motion sickness cars    Past Surgical History  Procedure Laterality Date  . Tonsillectomy      Prior to Admission medications   Medication Sig Start Date End Date Taking? Authorizing Provider  cyanocobalamin (,VITAMIN B-12,) 1000 MCG/ML injection  04/14/14  Yes Historical Provider, MD  ibuprofen (ADVIL,MOTRIN) 800 MG tablet Take 1 tablet (800 mg total) by mouth every 8 (eight) hours as needed for moderate pain. 09/13/15  Yes Sable Feil, PA-C  nitrofurantoin (MACRODANTIN) 100 MG capsule Take 100 mg by mouth 2 (two) times daily.   Yes Historical Provider, MD  Vitamin D, Ergocalciferol, (DRISDOL) 50000 units CAPS capsule  04/14/14  Yes Historical Provider, MD  levonorgestrel-ethinyl estradiol (AVIANE,ALESSE,LESSINA) 0.1-20 MG-MCG tablet Reported on 02/18/2016 04/07/14   Historical Provider, MD  linaclotide Rolan Lipa) 72 MCG capsule Take 72 mcg by mouth daily before breakfast. Reported on 02/18/2016    Historical Provider, MD    Allergies as of 02/04/2016  . (No Known Allergies)    History reviewed. No pertinent family history.  Social History   Social History  . Marital Status: Single    Spouse Name: N/A  . Number of Children: N/A  . Years of Education: N/A   Occupational History  . Not on file.   Social History Main Topics  . Smoking status: Never Smoker   . Smokeless tobacco: Never Used  . Alcohol Use: No  . Drug Use: No  . Sexual Activity: Not on file   Other Topics Concern  . Not on file   Social History Narrative     Review of Systems: See HPI, otherwise negative ROS  Physical Exam: BP 125/77 mmHg  Pulse 84  Temp(Src) 97.5 F (36.4 C) (Temporal)  Ht 5\' 2"  (1.575 m)  Wt 221 lb (100.245 kg)  BMI 40.41 kg/m2  SpO2 100%  LMP 01/26/2016 General:   Alert,  pleasant and cooperative in NAD Head:  Normocephalic and atraumatic. Neck:  Supple; no masses or thyromegaly. Lungs:  Clear throughout to auscultation.    Heart:  Regular rate and rhythm. Abdomen:  Soft, nontender and nondistended. Normal bowel sounds, without guarding, and without rebound.   Neurologic:  Alert and  oriented x4;  grossly normal neurologically.  Impression/Plan: Shannon Foley is here for an endoscopy to be performed for abnormal imaging.  Risks, benefits, limitations, and alternatives regarding  endoscopy have been reviewed with the patient.  Questions have been answered.  All parties agreeable.   Lucilla Lame, MD  02/18/2016, 10:27 AM

## 2016-02-18 NOTE — Op Note (Signed)
[facility]enter[facility] Patient Name: Shannon Foley Procedure Date: 02/18/2016 10:57 AM MRN: [id number] Account #: [id number] Date of Birth: October 03, 1985 Admit Type: Outpatient Age: 30 Room: [facility] OR ROOM 01 Gender: Female Note Status: Finalized Procedure:            Upper GI endoscopy Indications:          Abnormal abdominal x-ray of the GI tract Providers:            Lucilla Lame, MD Referring MD:         [name], MD (Referring MD) Medicines:            Propofol per Anesthesia Complications:        No immediate complications. Procedure:            Pre-Anesthesia Assessment:                       - Prior to the procedure, a History and Physical was                        performed, and patient medications and allergies were                        reviewed. The patient's tolerance of previous                        anesthesia was also reviewed. The risks and benefits of                        the procedure and the sedation options and risks were                        discussed with the patient. All questions were                        answered, and informed consent was obtained. Prior                        Anticoagulants: The patient has taken no previous                        anticoagulant or antiplatelet agents. ASA Grade                        Assessment: II - A patient with mild systemic disease.                        After reviewing the risks and benefits, the patient was                        deemed in satisfactory condition to undergo the                        procedure.                       After obtaining informed consent, the endoscope was                        passed under direct vision. Throughout the procedure,  the patient's blood pressure, pulse, and oxygen                        saturations were monitored continuously. The Olympus                        [id number]-HQ190 Endoscope (S#. [id number]) was introduced                  through the mouth, and advanced to the second part of                        duodenum. The upper GI endoscopy was accomplished                        without difficulty. The patient tolerated the procedure                        well. Findings:      The esophagus was normal.      The stomach was normal.      The examined duodenum was normal. Impression:           - Normal esophagus.                       - Normal stomach.                       - Normal examined duodenum.                       - No specimens collected. Recommendation:       - No source of any of the patients symptoms seen.                       EGD normal. Procedure Code(s):    --- Professional ---                       [phone], Esophagogastroduodenoscopy, flexible, transoral;                        diagnostic, including collection of specimen(s) by                        brushing or washing, when performed (separate procedure) Diagnosis Code(s):    --- Professional ---                       R93.3, Abnormal findings on diagnostic imaging of other                        parts of digestive tract CPT copyright 2016 American Medical Association. All rights reserved. The codes documented in this report are preliminary and upon coder review may  be revised to meet current compliance requirements. Lucilla Lame, MD 02/18/2016 11:12:28 AM This report has been signed electronically. Number of Addenda: 0 Note Initiated On: 02/18/2016 10:57 AM Total Procedure Duration: 0 hours 2 minutes 21 seconds       [facility]

## 2016-02-18 NOTE — Anesthesia Postprocedure Evaluation (Signed)
Anesthesia Post Note  Patient: Shannon Foley  Procedure(s) Performed: Procedure(s) (LRB): ESOPHAGOGASTRODUODENOSCOPY (EGD) WITH PROPOFOL (N/A)  Patient location during evaluation: PACU Anesthesia Type: General Level of consciousness: awake and alert Pain management: pain level controlled Vital Signs Assessment: post-procedure vital signs reviewed and stable Respiratory status: spontaneous breathing, nonlabored ventilation, respiratory function stable and patient connected to nasal cannula oxygen Cardiovascular status: blood pressure returned to baseline and stable Postop Assessment: no signs of nausea or vomiting Anesthetic complications: no    Marshell Levan

## 2016-02-18 NOTE — Anesthesia Preprocedure Evaluation (Signed)
Anesthesia Evaluation  Patient identified by MRN, date of birth, ID band Patient awake    Airway Mallampati: I  TM Distance: >3 FB Neck ROM: Full    Dental   Pulmonary    Pulmonary exam normal        Cardiovascular Normal cardiovascular exam     Neuro/Psych  Headaches,    GI/Hepatic   Endo/Other    Renal/GU      Musculoskeletal   Abdominal   Peds  Hematology   Anesthesia Other Findings   Reproductive/Obstetrics                             Anesthesia Physical Anesthesia Plan  ASA: II  Anesthesia Plan: MAC   Post-op Pain Management:    Induction: Intravenous  Airway Management Planned:   Additional Equipment:   Intra-op Plan:   Post-operative Plan:   Informed Consent: I have reviewed the patients History and Physical, chart, labs and discussed the procedure including the risks, benefits and alternatives for the proposed anesthesia with the patient or authorized representative who has indicated his/her understanding and acceptance.     Plan Discussed with: CRNA  Anesthesia Plan Comments:         Anesthesia Quick Evaluation

## 2016-02-21 ENCOUNTER — Encounter: Payer: Self-pay | Admitting: Gastroenterology

## 2016-03-15 DIAGNOSIS — D259 Leiomyoma of uterus, unspecified: Secondary | ICD-10-CM | POA: Diagnosis not present

## 2016-03-15 DIAGNOSIS — R1010 Upper abdominal pain, unspecified: Secondary | ICD-10-CM | POA: Diagnosis not present

## 2016-03-20 DIAGNOSIS — N390 Urinary tract infection, site not specified: Secondary | ICD-10-CM | POA: Diagnosis not present

## 2016-03-20 DIAGNOSIS — M545 Low back pain: Secondary | ICD-10-CM | POA: Diagnosis not present

## 2016-03-20 DIAGNOSIS — R319 Hematuria, unspecified: Secondary | ICD-10-CM | POA: Diagnosis not present

## 2016-03-24 ENCOUNTER — Other Ambulatory Visit: Payer: Self-pay | Admitting: Nurse Practitioner

## 2016-03-24 DIAGNOSIS — R319 Hematuria, unspecified: Secondary | ICD-10-CM

## 2016-03-28 DIAGNOSIS — D259 Leiomyoma of uterus, unspecified: Secondary | ICD-10-CM | POA: Diagnosis not present

## 2016-04-03 ENCOUNTER — Ambulatory Visit
Admission: RE | Admit: 2016-04-03 | Discharge: 2016-04-03 | Disposition: A | Discharge status: Home / Self Care | Payer: Medicare Other | Source: Ambulatory Visit | Attending: Nurse Practitioner | Admitting: Nurse Practitioner

## 2016-04-03 DIAGNOSIS — K7689 Other specified diseases of liver: Secondary | ICD-10-CM | POA: Insufficient documentation

## 2016-04-03 DIAGNOSIS — R319 Hematuria, unspecified: Secondary | ICD-10-CM | POA: Diagnosis not present

## 2016-04-03 DIAGNOSIS — D259 Leiomyoma of uterus, unspecified: Secondary | ICD-10-CM | POA: Insufficient documentation

## 2016-04-03 DIAGNOSIS — R1084 Generalized abdominal pain: Secondary | ICD-10-CM | POA: Diagnosis not present

## 2016-04-12 DIAGNOSIS — M545 Low back pain: Secondary | ICD-10-CM | POA: Diagnosis not present

## 2016-04-18 ENCOUNTER — Ambulatory Visit (INDEPENDENT_AMBULATORY_CARE_PROVIDER_SITE_OTHER): Payer: Medicare Other | Admitting: Urology

## 2016-04-18 VITALS — BP 113/78 | HR 91 | Ht 62.0 in | Wt 217.3 lb

## 2016-04-18 DIAGNOSIS — N390 Urinary tract infection, site not specified: Secondary | ICD-10-CM

## 2016-04-18 LAB — BLADDER SCAN AMB NON-IMAGING: Scan Result: 0

## 2016-04-18 LAB — MICROSCOPIC EXAMINATION

## 2016-04-18 LAB — URINALYSIS, COMPLETE
BILIRUBIN UA: NEGATIVE
Glucose, UA: NEGATIVE
KETONES UA: NEGATIVE
NITRITE UA: NEGATIVE
PH UA: 5.5 (ref 5.0–7.5)
Urobilinogen, Ur: 0.2 mg/dL (ref 0.2–1.0)

## 2016-04-18 NOTE — Progress Notes (Signed)
04/18/2016 10:37 AM   [name] August 08, 1986 [id number]  Referring provider: [name], MD [address] [place], [place] [address]  Chief Complaint  Patient presents with  . New Patient (Initial Visit)    recurrent UTI    HPI: Shannon Foley is a 30yo seen today for recurrent UTIs. She has presented to her PCP 4 times in the past 3-4 months. SHe had suprapubic pain, intermittent dysuria, frequency and mild urgency with each event. She was previously treated with cipro and recently finished a course of bactrim. Here LUTS have resolved after the last course of bactrim. She has a BM every 1-2 days. She takes linzess prn.  No hx of gross hematuria.   Her last UA in epic showed WBCs but no RBCs or bacteria.  No personal hx of nephrolithaisis.   She under Ct stone study which was negative the urinary calculi. She urinates after intercourse   PMH: Past Medical History:  Diagnosis Date  . Back pain    mid and lower back  . Headache    caused by pituitary cyst  . Motion sickness cars  . Pituitary cyst [facility])     Surgical History: Past Surgical History:  Procedure Laterality Date  . ESOPHAGOGASTRODUODENOSCOPY (EGD) WITH PROPOFOL N/A 02/18/2016   Procedure: ESOPHAGOGASTRODUODENOSCOPY (EGD) WITH PROPOFOL;  Surgeon: Lucilla Lame, MD;  Location: [place];  Service: Endoscopy;  Laterality: N/A;  . TONSILLECTOMY      Home Medications:    Medication List       Accurate as of 04/18/16 10:37 AM. Always use your most recent med list.          cyanocobalamin 1000 MCG/ML injection Commonly known as:  (VITAMIN B-12)   ibuprofen 800 MG tablet Commonly known as:  ADVIL,MOTRIN Take 1 tablet (800 mg total) by mouth every 8 (eight) hours as needed for moderate pain.   levonorgestrel-ethinyl estradiol 0.1-20 MG-MCG tablet Commonly known as:  AVIANE,ALESSE,LESSINA Reported on 02/18/2016   LINZESS 72 MCG capsule Generic drug:  linaclotide Take 72 mcg by mouth daily before  breakfast. Reported on 02/18/2016   nitrofurantoin 100 MG capsule Commonly known as:  MACRODANTIN Take 100 mg by mouth 2 (two) times daily.   Vitamin D (Ergocalciferol) 50000 units Caps capsule Commonly known as:  DRISDOL       Allergies: No Known Allergies  Family History: No family history on file.  Social History:  reports that she has never smoked. She has never used smokeless tobacco. She reports that she does not drink alcohol or use drugs.  ROS: UROLOGY Frequent Urination?: No Hard to postpone urination?: No Burning/pain with urination?: No Get up at night to urinate?: No Leakage of urine?: No Urine stream starts and stops?: No Trouble starting stream?: No Do you have to strain to urinate?: No Blood in urine?: Yes Urinary tract infection?: Yes Sexually transmitted disease?: No Injury to kidneys or bladder?: No Painful intercourse?: No Weak stream?: No Currently pregnant?: No Vaginal bleeding?: No Last menstrual period?: n  Gastrointestinal Nausea?: No Vomiting?: No Indigestion/heartburn?: No Diarrhea?: No Constipation?: Yes  Constitutional Fever: No Night sweats?: No Weight loss?: No Fatigue?: Yes  Skin Skin rash/lesions?: No Itching?: No  Eyes Blurred vision?: No Double vision?: No  Ears/Nose/Throat Sore throat?: No Sinus problems?: No  Hematologic/Lymphatic Swollen glands?: No Easy bruising?: No  Cardiovascular Leg swelling?: No Chest pain?: No  Respiratory Cough?: No Shortness of breath?: No  Endocrine Excessive thirst?: No  Musculoskeletal Back pain?: Yes Joint  pain?: No  Neurological Headaches?: Yes Dizziness?: Yes  Psychologic Depression?: No Anxiety?: No  Physical Exam: BP 113/78   Pulse 91   Ht 5\' 2"  (1.575 m)   Wt 98.6 kg (217 lb 4.8 oz)   LMP 03/27/2016 (Exact Date)   BMI 39.74 kg/m   Constitutional:  Alert and oriented, No acute distress. HEENT: [place] AT, moist mucus membranes.  Trachea midline, no  masses. Cardiovascular: No clubbing, cyanosis, or edema. Respiratory: Normal respiratory effort, no increased work of breathing. GI: Abdomen is soft, nontender, nondistended, no abdominal masses GU: No CVA tenderness. Normal external genitalia. No lesions/masses on labia. Vagina normal Urethra normal. No cystocele, no rectocele Skin: No rashes, bruises or suspicious lesions. Lymph: No cervical or inguinal adenopathy. Neurologic: Grossly intact, no focal deficits, moving all 4 extremities. Psychiatric: Normal mood and affect.  Laboratory Data: Lab Results  Component Value Date   WBC 11.4 (H) 12/25/2015   HGB 14.7 12/25/2015   HCT 43.2 12/25/2015   MCV 89.8 12/25/2015   PLT 369 12/25/2015    Lab Results  Component Value Date   CREATININE 0.71 12/25/2015    No results found for: PSA  No results found for: TESTOSTERONE  No results found for: HGBA1C  Urinalysis    Component Value Date/Time   COLORURINE YELLOW (A) 12/25/2015 0957   APPEARANCEUR CLOUDY (A) 12/25/2015 0957   LABSPEC 1.019 12/25/2015 0957   PHURINE 6.0 12/25/2015 0957   GLUCOSEU NEGATIVE 12/25/2015 0957   HGBUR NEGATIVE 12/25/2015 0957   BILIRUBINUR NEGATIVE 12/25/2015 0957   KETONESUR NEGATIVE 12/25/2015 0957   PROTEINUR NEGATIVE 12/25/2015 0957   NITRITE NEGATIVE 12/25/2015 0957   LEUKOCYTESUR 2+ (A) 12/25/2015 0957    Pertinent Imaging: Ct Stone study  Assessment & Plan:    1. Recurrent UTI -schedule for cystoscopy - Urinalysis, Complete - Bladder Scan (Post Void Residual) in office -if workup negative we discussed prophylaxis.   No Follow-up on file.  Nicolette Bang, MD  [facility] [address], [place], [place] [address] [phone]

## 2016-04-22 DIAGNOSIS — M545 Low back pain: Secondary | ICD-10-CM | POA: Diagnosis not present

## 2016-04-22 DIAGNOSIS — M546 Pain in thoracic spine: Secondary | ICD-10-CM | POA: Diagnosis not present

## 2016-04-28 DIAGNOSIS — M545 Low back pain: Secondary | ICD-10-CM | POA: Diagnosis not present

## 2016-05-10 ENCOUNTER — Ambulatory Visit (INDEPENDENT_AMBULATORY_CARE_PROVIDER_SITE_OTHER): Payer: Medicare Other | Admitting: Urology

## 2016-05-10 VITALS — BP 131/77 | HR 77 | Ht 62.0 in | Wt 218.0 lb

## 2016-05-10 DIAGNOSIS — N390 Urinary tract infection, site not specified: Secondary | ICD-10-CM

## 2016-05-10 HISTORY — PX: CYSTOSCOPY: SUR368

## 2016-05-10 LAB — URINALYSIS, COMPLETE
BILIRUBIN UA: NEGATIVE
GLUCOSE, UA: NEGATIVE
Ketones, UA: NEGATIVE
LEUKOCYTES UA: NEGATIVE
Nitrite, UA: NEGATIVE
PROTEIN UA: NEGATIVE
RBC UA: NEGATIVE
Specific Gravity, UA: 1.025 (ref 1.005–1.030)
UUROB: 0.2 mg/dL (ref 0.2–1.0)
pH, UA: 5.5 (ref 5.0–7.5)

## 2016-05-10 LAB — MICROSCOPIC EXAMINATION: BACTERIA UA: NONE SEEN

## 2016-05-10 MED ORDER — CIPROFLOXACIN HCL 500 MG PO TABS
500.0000 mg | ORAL_TABLET | Freq: Once | ORAL | Status: AC
  Administered 2016-05-10: 500 mg via ORAL

## 2016-05-10 MED ORDER — LIDOCAINE HCL 2 % EX GEL
1.0000 "application " | Freq: Once | CUTANEOUS | Status: AC
  Administered 2016-05-10: 1 via URETHRAL

## 2016-05-10 NOTE — Progress Notes (Signed)
05/10/16   

## 2016-05-10 NOTE — Progress Notes (Signed)
   05/10/16   HPI: A 30 year old female who presents today for office cystoscopy for further workup of her recurrent urinary tract infections previously evaluated by Dr. Rosie Fate.  Recent imaging included CT abdomen pelvis with contrast on 01/04/2016 as well as without contrast on 04/03/2016 which shows no GU pathology.    Cystoscopy Procedure Note  Patient identification was confirmed, informed consent was obtained, and patient was prepped using Betadine solution.  Lidocaine jelly was administered per urethral meatus.    Preoperative abx where received prior to procedure.    Procedure: - Flexible cystoscope introduced, without any difficulty.   - Thorough search of the bladder revealed:    normal urethral meatus    normal urothelium    no stones    no ulcers     no tumors    no urethral polyps    no trabeculation  - Ureteral orifices were normal in position and appearance.  Post-Procedure: - Patient tolerated the procedure well  Assessment/plan:  1. Recurrent urinary tract infection-no bladder pathology identified. No stones or any other GU pathology as a nidus for infections. Discussed general hygiene techniques today in detail.  Stressed adequate hydration. I also recommended initiating a probiotic as well as cranberry tabs twice a day.  Patient was advised if she has any signs or symptoms associated with urinary tract infections, she should call our office to arrange UA/urine culture. If she has 3 documented urinary tract infections over 6 month period, may consider short-term of antibiotic suppression.  Hollice Espy, MD

## 2016-05-12 ENCOUNTER — Other Ambulatory Visit: Payer: Medicare Other

## 2016-05-13 ENCOUNTER — Encounter: Payer: Self-pay | Admitting: Urology

## 2016-06-06 DIAGNOSIS — N390 Urinary tract infection, site not specified: Secondary | ICD-10-CM | POA: Diagnosis not present

## 2016-06-06 DIAGNOSIS — J309 Allergic rhinitis, unspecified: Secondary | ICD-10-CM | POA: Diagnosis not present

## 2016-08-02 DIAGNOSIS — M545 Low back pain: Secondary | ICD-10-CM | POA: Diagnosis not present

## 2016-08-02 DIAGNOSIS — M542 Cervicalgia: Secondary | ICD-10-CM | POA: Diagnosis not present

## 2016-08-02 DIAGNOSIS — J309 Allergic rhinitis, unspecified: Secondary | ICD-10-CM | POA: Diagnosis not present

## 2016-09-05 ENCOUNTER — Emergency Department
Admission: EM | Admit: 2016-09-05 | Discharge: 2016-09-05 | Disposition: A | Discharge status: Home / Self Care | Payer: Medicare Other | Attending: Emergency Medicine | Admitting: Emergency Medicine

## 2016-09-05 ENCOUNTER — Emergency Department: Payer: Medicare Other

## 2016-09-05 ENCOUNTER — Encounter: Payer: Self-pay | Admitting: Emergency Medicine

## 2016-09-05 DIAGNOSIS — Z79899 Other long term (current) drug therapy: Secondary | ICD-10-CM | POA: Insufficient documentation

## 2016-09-05 DIAGNOSIS — Z791 Long term (current) use of non-steroidal anti-inflammatories (NSAID): Secondary | ICD-10-CM | POA: Insufficient documentation

## 2016-09-05 DIAGNOSIS — R1012 Left upper quadrant pain: Secondary | ICD-10-CM | POA: Insufficient documentation

## 2016-09-05 LAB — URINALYSIS, COMPLETE (UACMP) WITH MICROSCOPIC
BILIRUBIN URINE: NEGATIVE
Glucose, UA: 50 mg/dL — AB
Hgb urine dipstick: NEGATIVE
Ketones, ur: 5 mg/dL — AB
NITRITE: NEGATIVE
PH: 9 — AB (ref 5.0–8.0)
Protein, ur: 30 mg/dL — AB
RBC / HPF: NONE SEEN RBC/hpf (ref 0–5)
SPECIFIC GRAVITY, URINE: 1.02 (ref 1.005–1.030)

## 2016-09-05 LAB — COMPREHENSIVE METABOLIC PANEL
ALK PHOS: 81 U/L (ref 38–126)
ALT: 10 U/L — ABNORMAL LOW (ref 14–54)
ANION GAP: 7 (ref 5–15)
AST: 21 U/L (ref 15–41)
Albumin: 4.5 g/dL (ref 3.5–5.0)
BILIRUBIN TOTAL: 0.8 mg/dL (ref 0.3–1.2)
BUN: 9 mg/dL (ref 6–20)
CALCIUM: 8.8 mg/dL — AB (ref 8.9–10.3)
CO2: 23 mmol/L (ref 22–32)
Chloride: 105 mmol/L (ref 101–111)
Creatinine, Ser: 0.7 mg/dL (ref 0.44–1.00)
Glucose, Bld: 109 mg/dL — ABNORMAL HIGH (ref 65–99)
Potassium: 3.5 mmol/L (ref 3.5–5.1)
SODIUM: 135 mmol/L (ref 135–145)
TOTAL PROTEIN: 8.4 g/dL — AB (ref 6.5–8.1)

## 2016-09-05 LAB — CBC WITH DIFFERENTIAL/PLATELET
Basophils Absolute: 0 10*3/uL (ref 0–0.1)
Basophils Relative: 0 %
EOS ABS: 0 10*3/uL (ref 0–0.7)
Eosinophils Relative: 1 %
HEMATOCRIT: 42.7 % (ref 35.0–47.0)
HEMOGLOBIN: 14.5 g/dL (ref 12.0–16.0)
LYMPHS ABS: 1.9 10*3/uL (ref 1.0–3.6)
Lymphocytes Relative: 18 %
MCH: 30.6 pg (ref 26.0–34.0)
MCHC: 33.9 g/dL (ref 32.0–36.0)
MCV: 90.4 fL (ref 80.0–100.0)
MONOS PCT: 7 %
Monocytes Absolute: 0.7 10*3/uL (ref 0.2–0.9)
NEUTROS PCT: 74 %
Neutro Abs: 7.7 10*3/uL — ABNORMAL HIGH (ref 1.4–6.5)
Platelets: 412 10*3/uL (ref 150–440)
RBC: 4.73 MIL/uL (ref 3.80–5.20)
RDW: 13.5 % (ref 11.5–14.5)
WBC: 10.3 10*3/uL (ref 3.6–11.0)

## 2016-09-05 LAB — PREGNANCY, URINE: PREG TEST UR: NEGATIVE

## 2016-09-05 LAB — LIPASE, BLOOD: LIPASE: 13 U/L (ref 11–51)

## 2016-09-05 MED ORDER — POLYETHYLENE GLYCOL 3350 17 GM/SCOOP PO POWD: 17.0000 g | Freq: Every day | ORAL | 0 refills | Status: DC

## 2016-09-05 MED ORDER — FOSFOMYCIN TROMETHAMINE 3 G PO PACK
3.0000 g | PACK | Freq: Once | ORAL | Status: AC
  Administered 2016-09-05: 3 g via ORAL
  Filled 2016-09-05: qty 3

## 2016-09-05 NOTE — ED Triage Notes (Signed)
Reports LUQ abd pain since yesterday. Denies n/v/d

## 2016-09-05 NOTE — ED Notes (Signed)
Pt reports abd pain (for a month) that is radiating into left upper abd (the change in pain started yesterday) - pt was seen by PCP one month ago and "test were done" and nothing was found - denies urinary pain or frequency - denies issues with eating - denies N/V - denies heartburn/indigestion

## 2016-09-05 NOTE — ED Provider Notes (Signed)
[facility] Emergency Department Provider Note  Time seen: 1:36 PM  I have reviewed the triage vital signs and the nursing notes.   HISTORY  Chief Complaint Abdominal Pain    HPI Shannon Foley is a 30 y.o. female who presents the emergency department left upper quadrant abdominal pain. According to the patient since yesterday she has been experiencing left upper quadrant abdominal pain. States it is somewhat sharp, comes and goes. Currently states the pain is minimal but earlier today was an 8/10. Denies any nausea, vomiting, diarrhea, fever, dysuria, vaginal symptoms. Patient's last period ended 10 days ago. Patient states she has had this pain many times in the past but no one has ever been able to tell her what the pain is caused by.  Past Medical History:  Diagnosis Date  . Back pain    mid and lower back  . Headache    caused by pituitary cyst  . Motion sickness cars  . Pituitary cyst [facility])     Patient Active Problem List   Diagnosis Date Noted  . Abnormal findings-gastrointestinal tract   . Rathke's pouch cyst ([place]) 02/02/2016  . Absolute anemia 04/29/2014  . Disease of pituitary gland ([place]) 04/29/2014  . Fatigue 04/29/2014  . Hyperprolactinemia ([place]) 04/29/2014  . B12 deficiency 04/29/2014  . Cephalalgia 02/19/2014  . Other enthesopathy of unspecified foot 12/18/2013  . Foot pain 12/18/2013  . Dupuytren's contracture of foot 12/18/2013  . Plantar fasciitis 12/18/2013    Past Surgical History:  Procedure Laterality Date  . ESOPHAGOGASTRODUODENOSCOPY (EGD) WITH PROPOFOL N/A 02/18/2016   Procedure: ESOPHAGOGASTRODUODENOSCOPY (EGD) WITH PROPOFOL;  Surgeon: Lucilla Lame, MD;  Location: [place];  Service: Endoscopy;  Laterality: N/A;  . TONSILLECTOMY      Prior to Admission medications   Medication Sig Start Date End Date Taking? Authorizing Provider  cyanocobalamin (,VITAMIN B-12,) 1000 MCG/ML injection  04/14/14   Historical  Provider, MD  ibuprofen (ADVIL,MOTRIN) 800 MG tablet Take 1 tablet (800 mg total) by mouth every 8 (eight) hours as needed for moderate pain. 09/13/15   Sable Feil, PA-C  linaclotide Rolan Lipa) 72 MCG capsule Take 72 mcg by mouth daily before breakfast. Reported on 02/18/2016    Historical Provider, MD    No Known Allergies  No family history on file.  Social History Social History  Substance Use Topics  . Smoking status: Never Smoker  . Smokeless tobacco: Never Used  . Alcohol use No    Review of Systems Constitutional: Negative for fever. Cardiovascular: Negative for chest pain. Respiratory: Negative for shortness of breath. Gastrointestinal: Left upper quadrant abdominal pain. Negative for nausea, vomiting, diarrhea. Genitourinary: Negative for dysuria. Musculoskeletal: Negative for back pain. Neurological: Negative for headache 10-point ROS otherwise negative.  ____________________________________________   PHYSICAL EXAM:  VITAL SIGNS: ED Triage Vitals  Enc Vitals Group     BP 09/05/16 1147 (!) 153/91     Pulse Rate 09/05/16 1147 92     Resp 09/05/16 1147 16     Temp 09/05/16 1147 98.2 F (36.8 C)     Temp Source 09/05/16 1147 Oral     SpO2 09/05/16 1147 99 %     Weight 09/05/16 1147 217 lb (98.4 kg)     Height 09/05/16 1147 5\' 1"  (1.549 m)     Head Circumference --      Peak Flow --      Pain Score 09/05/16 1148 7     Pain Loc --  Pain Edu? --      Excl. in IXL? --    Constitutional: Alert and oriented. Well appearing and in no distress. Eyes: Normal exam ENT   Head: Normocephalic and atraumatic.   Mouth/Throat: Mucous membranes are moist. Cardiovascular: Normal rate, regular rhythm. No murmur Respiratory: Normal respiratory effort without tachypnea nor retractions. Breath sounds are clear  Gastrointestinal: Soft and nontender. No distention. Tenderness. Musculoskeletal: Nontender with normal range of motion in all extremities Neurologic:   Normal speech and language. No gross focal neurologic deficits Skin:  Skin is warm, dry and intact.  Psychiatric: Mood and affect are normal. Speech and behavior are normal.   ____________________________________________     RADIOLOGY  X-ray negative  ____________________________________________   INITIAL IMPRESSION / ASSESSMENT AND PLAN / ED COURSE  Pertinent labs & imaging results that were available during my care of the patient were reviewed by me and considered in my medical decision making (see chart for details).  Patient presents for intermittent left upper quadrant abdominal pain since yesterday. States minimal to no pain currently. States the pain comes and goes. Moderate sharp pain when it is there. Largely negative review of systems otherwise. Patient's labs have resulted in largely within normal limits, nontender on examination. We'll obtain an x-ray of the abdomen and reevaluate for possible increased stool Burden. Urinalysis pending. Urine pregnancy pending.  Urinalysis shows possible small urinary tract infection we will dose of a one-time dose fosfomycin, urine culture has been added. Urine pregnancy test is negative. Patient's x-ray does appear to show a decent stool burden, read as negative. We'll discharge with MiraLAX and PCP follow-up. Patient agreeable to plan.  ____________________________________________   FINAL CLINICAL IMPRESSION(S) / ED DIAGNOSES  Abdominal pain    Harvest Dark, MD 09/05/16 1444

## 2016-09-06 LAB — URINE CULTURE: Culture: <10000 — AB

## 2016-09-07 DIAGNOSIS — R109 Unspecified abdominal pain: Secondary | ICD-10-CM | POA: Diagnosis not present

## 2016-09-07 DIAGNOSIS — N390 Urinary tract infection, site not specified: Secondary | ICD-10-CM | POA: Diagnosis not present

## 2016-10-16 DIAGNOSIS — R7301 Impaired fasting glucose: Secondary | ICD-10-CM | POA: Diagnosis not present

## 2016-10-16 DIAGNOSIS — E559 Vitamin D deficiency, unspecified: Secondary | ICD-10-CM | POA: Diagnosis not present

## 2016-10-16 DIAGNOSIS — R10817 Generalized abdominal tenderness: Secondary | ICD-10-CM | POA: Diagnosis not present

## 2016-10-16 DIAGNOSIS — N390 Urinary tract infection, site not specified: Secondary | ICD-10-CM | POA: Diagnosis not present

## 2016-10-16 DIAGNOSIS — D519 Vitamin B12 deficiency anemia, unspecified: Secondary | ICD-10-CM | POA: Diagnosis not present

## 2016-10-16 DIAGNOSIS — D352 Benign neoplasm of pituitary gland: Secondary | ICD-10-CM | POA: Diagnosis not present

## 2016-10-16 DIAGNOSIS — D509 Iron deficiency anemia, unspecified: Secondary | ICD-10-CM | POA: Diagnosis not present

## 2016-10-16 DIAGNOSIS — Z0001 Encounter for general adult medical examination with abnormal findings: Secondary | ICD-10-CM | POA: Diagnosis not present

## 2016-11-01 ENCOUNTER — Other Ambulatory Visit: Payer: Self-pay

## 2017-01-10 DIAGNOSIS — M791 Myalgia: Secondary | ICD-10-CM | POA: Diagnosis not present

## 2017-01-19 DIAGNOSIS — R5383 Other fatigue: Secondary | ICD-10-CM | POA: Diagnosis not present

## 2017-01-19 DIAGNOSIS — R109 Unspecified abdominal pain: Secondary | ICD-10-CM | POA: Diagnosis not present

## 2017-01-19 DIAGNOSIS — R51 Headache: Secondary | ICD-10-CM | POA: Diagnosis not present

## 2017-01-19 DIAGNOSIS — E236 Other disorders of pituitary gland: Secondary | ICD-10-CM | POA: Diagnosis not present

## 2017-01-19 DIAGNOSIS — R1010 Upper abdominal pain, unspecified: Secondary | ICD-10-CM | POA: Diagnosis not present

## 2017-01-19 DIAGNOSIS — E237 Disorder of pituitary gland, unspecified: Secondary | ICD-10-CM | POA: Diagnosis not present

## 2017-01-29 ENCOUNTER — Encounter: Payer: Self-pay | Admitting: Certified Nurse Midwife

## 2017-01-29 ENCOUNTER — Ambulatory Visit: Payer: Self-pay | Admitting: Certified Nurse Midwife

## 2017-03-09 ENCOUNTER — Encounter: Payer: Self-pay | Admitting: Certified Nurse Midwife

## 2017-03-09 ENCOUNTER — Ambulatory Visit (INDEPENDENT_AMBULATORY_CARE_PROVIDER_SITE_OTHER): Payer: Medicare Other | Admitting: Certified Nurse Midwife

## 2017-03-09 VITALS — BP 112/72 | HR 86 | Ht 62.0 in | Wt 214.0 lb

## 2017-03-09 DIAGNOSIS — Z13220 Encounter for screening for lipoid disorders: Secondary | ICD-10-CM | POA: Diagnosis not present

## 2017-03-09 DIAGNOSIS — Z124 Encounter for screening for malignant neoplasm of cervix: Secondary | ICD-10-CM | POA: Diagnosis not present

## 2017-03-09 DIAGNOSIS — Z01419 Encounter for gynecological examination (general) (routine) without abnormal findings: Secondary | ICD-10-CM

## 2017-03-09 DIAGNOSIS — Z113 Encounter for screening for infections with a predominantly sexual mode of transmission: Secondary | ICD-10-CM | POA: Diagnosis not present

## 2017-03-09 DIAGNOSIS — R87612 Low grade squamous intraepithelial lesion on cytologic smear of cervix (LGSIL): Secondary | ICD-10-CM | POA: Diagnosis not present

## 2017-03-09 DIAGNOSIS — Z30011 Encounter for initial prescription of contraceptive pills: Secondary | ICD-10-CM

## 2017-03-09 DIAGNOSIS — L292 Pruritus vulvae: Secondary | ICD-10-CM

## 2017-03-09 DIAGNOSIS — Z131 Encounter for screening for diabetes mellitus: Secondary | ICD-10-CM

## 2017-03-09 DIAGNOSIS — Z7251 High risk heterosexual behavior: Secondary | ICD-10-CM | POA: Diagnosis not present

## 2017-03-09 LAB — POCT URINE PREGNANCY: Preg Test, Ur: NEGATIVE

## 2017-03-09 MED ORDER — NORETHIN ACE-ETH ESTRAD-FE 1-20 MG-MCG(24) PO TABS: 1.0000 | ORAL_TABLET | Freq: Every day | ORAL | 11 refills | Status: DC

## 2017-03-09 MED ORDER — HYDROCORTISONE 1 % EX OINT: 1.0000 "application " | TOPICAL_OINTMENT | Freq: Two times a day (BID) | CUTANEOUS | 0 refills | Status: DC | PRN

## 2017-03-09 NOTE — Progress Notes (Signed)
Gynecology Annual Exam  PCP: [name], MD  Chief Complaint:  Chief Complaint  Patient presents with  . Gynecologic Exam    History of Present Illness:Shannon Foley presents today with her mother for her NP annual exam. She is a 31 year old African American/Black female, G0 P0000, whose LMP was 03/07/2017. Her menses are regular. They occur every 1 month, they last 5 days, are medium flow, and are without clots. She has not had any intermenstrual spotting. She reports dysmenorrhea. She uses nsaids with symptomatic relief. She reports some vulvar itching recently. Denies change in soaps or hygiene products. Uses sensitive skin soap. Denies unusual discharge or vaginal odor. Has not used any antibiotics in the last few months. The patient's past medical history is remarkable for Rathke's pouch cyst, frequent UTIs, and obesity. She had a work up for lower abdominal pain and on ultrasound was noted to have two small fibroids (one possible submucosal). She also had a cystoscopy for frequent UTIs and this was normal. Last UTI about 6 mos ago. She is sexually active. She is currently using nothing for contraception. She would like to restart birth control pills. She had 1 encounter of unprotected sex this cycle and would like a pregnancy test.  Her last Pap smear was 06/16/2015 and was NIL/ neg HRHPV.  Mammogram is not applicable. There is no family history of breast cancer. There is no family history of ovarian cancer. The patient does not do monthly self breast exams.  The patient does not smoke.  The patient does not drink alcohol.  The patient does not use illegal drugs.  The patient does not exercise The patient does not get adequate calcium in her diet.    The patient denies current symptoms of depression.    Review of Systems: Review of Systems  Constitutional: Positive for weight loss (six pounds). Negative for chills and fever.  HENT: Negative for congestion, sinus pain and  sore throat.   Eyes: Negative for blurred vision and pain.  Respiratory: Negative for hemoptysis, shortness of breath and wheezing.   Cardiovascular: Negative for chest pain, palpitations and leg swelling.  Gastrointestinal: Negative for abdominal pain, blood in stool, diarrhea, heartburn, nausea and vomiting.  Genitourinary: Negative for dysuria, frequency, hematuria and urgency.       Positive for vulvar itching and dysmenorrhea  Musculoskeletal: Negative for back pain, joint pain and myalgias.  Skin: Negative for itching and rash.  Neurological: Negative for dizziness, tingling and headaches.  Endo/Heme/Allergies: Negative for environmental allergies and polydipsia. Does not bruise/bleed easily.       Negative for hirsutism   Psychiatric/Behavioral: Negative for depression. The patient is not nervous/anxious and does not have insomnia.     Past Medical History:  Past Medical History:  Diagnosis Date  . Back pain    mid and lower back  . Fibroids    one submucosal and one intramural (both  < 1 inch in size)  . Headache    caused by pituitary cyst  . Motion sickness cars  . Obesity   . Pituitary cyst ([place])    Rathke's pouch cyst    Past Surgical History:  Past Surgical History:  Procedure Laterality Date  . CYSTOSCOPY  05/10/2016   WNL  . ESOPHAGOGASTRODUODENOSCOPY (EGD) WITH PROPOFOL N/A 02/18/2016   Procedure: ESOPHAGOGASTRODUODENOSCOPY (EGD) WITH PROPOFOL;  Surgeon: Lucilla Lame, MD;  Location: [place];  Service: Endoscopy;  Laterality: N/A;  . TONSILLECTOMY  Family History:  Family History  Problem Relation Age of Onset  . Diabetes Mother   . Hypertension Mother   . Congestive Heart Failure Mother   . CVA Father        Had meningioma  . Diabetes Father   . Hypertension Father   . Bladder Cancer Paternal Aunt     Social History:  Social History   Social History  . Marital status: Single    Spouse name: N/A  . Number of children: 0  . Years  of education: N/A   Occupational History  . sales associate    Social History Main Topics  . Smoking status: Never Smoker  . Smokeless tobacco: Never Used  . Alcohol use No  . Drug use: No  . Sexual activity: Yes    Birth control/ protection: None   Other Topics Concern  . Not on file   Social History Narrative  . No narrative on file    Allergies:  No Known Allergies  Medications: Prior to Admission medications   Medication Sig Start Date End Date Taking? Authorizing Provider  ibuprofen (ADVIL,MOTRIN) 800 MG tablet Take 1 tablet (800 mg total) by mouth every 8 (eight) hours as needed for moderate pain. 09/13/15  Yes Sable Feil, PA-C           Physical Exam Vitals: BP 112/72   Pulse 86   Ht 5\' 2"  (1.575 m)   Wt 97.1 kg (214 lb)   LMP 03/07/2017 (Exact Date)   BMI 39.14 kg/m   General: BF in NAD HEENT: normocephalic, anicteric Neck: no thyroid enlargement, no palpable nodules, no cervical lymphadenopathy  Pulmonary: No increased work of breathing, CTAB Cardiovascular: RRR, without murmur  Breast: Breast symmetrical, no tenderness, no palpable nodules or masses, no skin or nipple retraction present, no nipple discharge.  No axillary, infraclavicular or supraclavicular lymphadenopathy. Abdomen: Soft, non-tender, non-distended.  Umbilicus without lesions.  No hepatomegaly or masses palpable. No evidence of hernia. Genitourinary:  External: Normal external female genitalia.  Normal urethral meatus, normal Bartholin's and Skene's glands.    Vagina: Normal vaginal mucosa, no evidence of prolapse.    Cervix: Grossly normal in appearance, no bleeding, non-tender  Uterus: Anteverted, slightly irregular on right fundal area, normal size,  mobile, and non-tender  Adnexa: No adnexal masses, non-tender  Rectal: deferred  Lymphatic: no evidence of inguinal lymphadenopathy Extremities: no edema, erythema, or tenderness Neurologic: Grossly intact Psychiatric: mood  appropriate, affect full  ICON negative Wet Prep: negative for clue cells, hyphae, Trich  Assessment: 31 y.o. G0P0000 well woman exam Vulvar itching Contraceptive management   Plan:    1) Breast cancer screening - recommend monthly self breast exam. Reviewed SBE  2) STI screening was offered and accepted. GC/ Chlamydia/ RPR and HIV ordered  3) Cervical cancer screening - Pap was done. ASCCP guidelines and rational discussed.  Patient opts for yearly screening interval  4) Contraception -patient wanted to restart BCPs. Did well on 20 mcg pill. RX for Microgestin 24. Can start today or on Sunday. Reviewed how to take, what to do for missed pills, use of condoms for STD risk reduction, and warning signs of thromboembolic disease. Follow up in 3 months  5) Routine healthcare maintenance including cholesterol and diabetes screening ordered today.  6) No evidence of vaginitis causing vulvar itching: Can use hydrocortisone prn   Dalia Heading, CNM

## 2017-03-10 LAB — RPR: RPR Ser Ql: NONREACTIVE

## 2017-03-10 LAB — HIV ANTIBODY (ROUTINE TESTING W REFLEX): HIV Screen 4th Generation wRfx: NONREACTIVE

## 2017-03-10 LAB — LIPID PANEL
CHOL/HDL RATIO: 2.5 ratio (ref 0.0–4.4)
CHOLESTEROL TOTAL: 160 mg/dL (ref 100–199)
HDL: 65 mg/dL (ref >39)
LDL Calculated: 89 mg/dL (ref 0–99)
TRIGLYCERIDES: 29 mg/dL (ref 0–149)
VLDL Cholesterol Cal: 6 mg/dL (ref 5–40)

## 2017-03-10 LAB — HEMOGLOBIN A1C
ESTIMATED AVERAGE GLUCOSE: 108 mg/dL
HEMOGLOBIN A1C: 5.4 % (ref 4.8–5.6)

## 2017-03-11 ENCOUNTER — Encounter: Payer: Self-pay | Admitting: Certified Nurse Midwife

## 2017-03-11 LAB — POCT WET PREP (WET MOUNT): Trichomonas Wet Prep HPF POC: ABSENT

## 2017-03-13 ENCOUNTER — Telehealth: Payer: Self-pay | Admitting: Certified Nurse Midwife

## 2017-03-13 LAB — IGP,CTNG,APTIMAHPV,RFX16/18,45
CHLAMYDIA, NUC. ACID AMP: NEGATIVE
Gonococcus by Nucleic Acid Amp: NEGATIVE
HPV Aptima: POSITIVE — AB
PAP Smear Comment: 0

## 2017-03-13 NOTE — Telephone Encounter (Signed)
Pt is able to be reach at 3 pm today due to work. Pt reports at work until [place]. Pt states your able to leave an detail message.

## 2017-03-13 NOTE — Telephone Encounter (Signed)
Discussed Pap smear results with patient: LGSIL with positive HRHPV. Recoomend colposcopy and scheduled for same. Blood work all normal.

## 2017-03-13 NOTE — Telephone Encounter (Signed)
Pt is calling to find out about her result. Please advise

## 2017-04-03 ENCOUNTER — Ambulatory Visit (INDEPENDENT_AMBULATORY_CARE_PROVIDER_SITE_OTHER): Payer: Medicare Other | Admitting: Obstetrics and Gynecology

## 2017-04-03 ENCOUNTER — Encounter: Payer: Self-pay | Admitting: Obstetrics and Gynecology

## 2017-04-03 VITALS — BP 124/70 | Wt 210.0 lb

## 2017-04-03 DIAGNOSIS — R87612 Low grade squamous intraepithelial lesion on cytologic smear of cervix (LGSIL): Secondary | ICD-10-CM | POA: Diagnosis not present

## 2017-04-03 DIAGNOSIS — N870 Mild cervical dysplasia: Secondary | ICD-10-CM | POA: Diagnosis not present

## 2017-04-03 DIAGNOSIS — N72 Inflammatory disease of cervix uteri: Secondary | ICD-10-CM | POA: Diagnosis not present

## 2017-04-03 NOTE — Progress Notes (Signed)
HPI:  Shannon Foley is a 31 y.o.  G0P0000  who presents today for evaluation and management of abnormal cervical cytology.    Dysplasia History:  LGSIL, HPV positive pap smear, Otherwise normal prior history of pap smears   OB History  Gravida Para Term Preterm AB Living  0 0 0 0 0 0  SAB TAB Ectopic Multiple Live Births  0 0 0 0 0        Past Medical History:  Diagnosis Date  . Back pain    mid and lower back  . Fibroids    one submucosal and one intramural (both  < 1 inch in size)  . Headache    caused by pituitary cyst  . Motion sickness cars  . Obesity   . Pituitary cyst ([place])    Rathke's pouch cyst    Past Surgical History:  Procedure Laterality Date  . CYSTOSCOPY  05/10/2016   WNL  . ESOPHAGOGASTRODUODENOSCOPY (EGD) WITH PROPOFOL N/A 02/18/2016   Procedure: ESOPHAGOGASTRODUODENOSCOPY (EGD) WITH PROPOFOL;  Surgeon: Lucilla Lame, MD;  Location: [place];  Service: Endoscopy;  Laterality: N/A;  . TONSILLECTOMY      SOCIAL HISTORY: History  Alcohol Use No   History  Drug Use No   Family History  Problem Relation Age of Onset  . Diabetes Mother   . Hypertension Mother   . Congestive Heart Failure Mother   . CVA Father        Had meningioma  . Diabetes Father   . Hypertension Father   . Bladder Cancer Paternal Aunt     ALLERGIES:  Patient has no known allergies.  Current Outpatient Prescriptions on File Prior to Visit  Medication Sig Dispense Refill  . hydrocortisone 1 % ointment Apply 1 application topically 2 (two) times daily as needed for itching. 30 g 0  . ibuprofen (ADVIL,MOTRIN) 800 MG tablet Take 1 tablet (800 mg total) by mouth every 8 (eight) hours as needed for moderate pain. 15 tablet 0  . Norethindrone Acetate-Ethinyl Estrad-FE (LOESTRIN 24 FE) 1-20 MG-MCG(24) tablet Take 1 tablet by mouth daily. 1 Package 11   No current facility-administered medications on file prior to visit.     Physical Exam: -Vitals:  BP 124/70    Wt 210 lb (95.3 kg)   LMP 03/07/2017 (Exact Date)   BMI 38.41 kg/m  GEN: WD, WN, NAD.  A+ O x 3, good mood and affect. ABD:  NT, ND.  Soft, no masses.  No hernias noted.   Pelvic:   Vulva: Normal appearance.  No lesions.  Vagina: No lesions or abnormalities noted.  Support: Normal pelvic support.  Urethra No masses tenderness or scarring.  Meatus Normal size without lesions or prolapse.  Cervix: See below.  Anus: Normal exam.  No lesions.  Perineum: Normal exam.  No lesions.        Bimanual   Uterus: Normal size.  Non-tender.  Mobile.  AV.  Adnexae: No masses.  Non-tender to palpation.  Cul-de-sac: Negative for abnormality.   PROCEDURE: 1.  Urine Pregnancy Test:  negative 2.  Colposcopy performed with 4% acetic acid after verbal consent obtained                                         -Aceto-white Lesions Location(s): 10-1 o'clock.              -  Biopsy performed at 11 and 1 o'clock               -ECC indicated and performed: Yes.       -Biopsy sites made hemostatic with pressure and/or Monsel's solution   -Satisfactory colposcopy: No.    -Evidence of Invasive cervical CA :  NO  ASSESSMENT:  Shannon Foley is a 31 y.o. G0P0000 here for  1. LGSIL on Pap smear of cervix   .  PLAN:  I discussed the grading system of pap smears and HPV high risk viral types.  We will discuss and base management after colpo results return.     Prentice Docker, MD  [facility] 04/03/2017  1:04 PM

## 2017-04-06 LAB — PATHOLOGY

## 2017-04-12 ENCOUNTER — Encounter: Payer: Self-pay | Admitting: Obstetrics and Gynecology

## 2017-04-12 ENCOUNTER — Telehealth: Payer: Self-pay | Admitting: Obstetrics and Gynecology

## 2017-04-12 NOTE — Telephone Encounter (Signed)
Pt is calling due to missed call. Please advise °

## 2017-04-12 NOTE — Progress Notes (Signed)
Please call patient and let her know that I found only low-grade changes on the biopsy during her colposcopy. This is good news. The recommendation is that she repeat her pap smear in one year.  Please let me know, if she has any questions. Thank you. I will also send her a letter for her records.

## 2017-04-12 NOTE — Telephone Encounter (Signed)
Pt aware of results 

## 2017-04-17 DIAGNOSIS — D519 Vitamin B12 deficiency anemia, unspecified: Secondary | ICD-10-CM | POA: Diagnosis not present

## 2017-04-17 DIAGNOSIS — R7301 Impaired fasting glucose: Secondary | ICD-10-CM | POA: Diagnosis not present

## 2017-04-17 DIAGNOSIS — R10816 Epigastric abdominal tenderness: Secondary | ICD-10-CM | POA: Diagnosis not present

## 2017-04-17 DIAGNOSIS — R51 Headache: Secondary | ICD-10-CM | POA: Diagnosis not present

## 2017-04-17 DIAGNOSIS — R5383 Other fatigue: Secondary | ICD-10-CM | POA: Diagnosis not present

## 2017-04-26 DIAGNOSIS — R5383 Other fatigue: Secondary | ICD-10-CM | POA: Diagnosis not present

## 2017-04-26 DIAGNOSIS — D509 Iron deficiency anemia, unspecified: Secondary | ICD-10-CM | POA: Diagnosis not present

## 2017-04-26 DIAGNOSIS — E559 Vitamin D deficiency, unspecified: Secondary | ICD-10-CM | POA: Diagnosis not present

## 2017-04-26 DIAGNOSIS — N926 Irregular menstruation, unspecified: Secondary | ICD-10-CM | POA: Diagnosis not present

## 2017-04-26 DIAGNOSIS — R7301 Impaired fasting glucose: Secondary | ICD-10-CM | POA: Diagnosis not present

## 2017-05-29 DIAGNOSIS — G8929 Other chronic pain: Secondary | ICD-10-CM | POA: Diagnosis not present

## 2017-05-29 DIAGNOSIS — R109 Unspecified abdominal pain: Secondary | ICD-10-CM | POA: Diagnosis not present

## 2017-06-08 ENCOUNTER — Ambulatory Visit: Payer: Medicare Other | Admitting: Certified Nurse Midwife

## 2017-06-26 DIAGNOSIS — H9209 Otalgia, unspecified ear: Secondary | ICD-10-CM | POA: Diagnosis not present

## 2017-06-26 DIAGNOSIS — J309 Allergic rhinitis, unspecified: Secondary | ICD-10-CM | POA: Diagnosis not present

## 2017-06-26 DIAGNOSIS — R51 Headache: Secondary | ICD-10-CM | POA: Diagnosis not present

## 2017-07-10 ENCOUNTER — Ambulatory Visit: Payer: Medicare Other | Admitting: Certified Nurse Midwife

## 2017-08-16 ENCOUNTER — Ambulatory Visit (INDEPENDENT_AMBULATORY_CARE_PROVIDER_SITE_OTHER): Payer: Medicare Other | Admitting: Certified Nurse Midwife

## 2017-08-16 ENCOUNTER — Encounter: Payer: Self-pay | Admitting: Certified Nurse Midwife

## 2017-08-16 VITALS — BP 112/72 | Ht 63.0 in | Wt 219.0 lb

## 2017-08-16 DIAGNOSIS — N760 Acute vaginitis: Secondary | ICD-10-CM

## 2017-08-16 DIAGNOSIS — B9689 Other specified bacterial agents as the cause of diseases classified elsewhere: Secondary | ICD-10-CM

## 2017-08-16 DIAGNOSIS — N898 Other specified noninflammatory disorders of vagina: Secondary | ICD-10-CM

## 2017-08-16 DIAGNOSIS — L292 Pruritus vulvae: Secondary | ICD-10-CM

## 2017-08-16 DIAGNOSIS — Z3041 Encounter for surveillance of contraceptive pills: Secondary | ICD-10-CM

## 2017-08-16 MED ORDER — TRIAMCINOLONE ACETONIDE 0.1 % EX OINT: 1.0000 "application " | TOPICAL_OINTMENT | Freq: Two times a day (BID) | CUTANEOUS | 0 refills | Status: DC | PRN

## 2017-08-16 MED ORDER — TINIDAZOLE 500 MG PO TABS: 1000.0000 mg | ORAL_TABLET | Freq: Every day | ORAL | 0 refills | Status: AC

## 2017-08-16 NOTE — Progress Notes (Signed)
  History of Present Illness:  Shannon Foley is a 31 y.o. who was started on Loestrin 24 approximately 5 months ago. Since that time, she states that she had no problems with headaches, nausea/vomiting, or BTB on the pills. She ran out of pills before she could come back for followup and has not taken them for 1 month. She would like to resume the pills. Has been abstinent since stopping the pills. Her LMP was 07/23/2017. She also complains of a vaginal discharge and vulvar irritation for the past month.  PMHx: She  has a past medical history of Back pain, Fibroids, Headache, Motion sickness (cars), Obesity, and Pituitary cyst ([place]). Also,  has a past surgical history that includes Tonsillectomy; Esophagogastroduodenoscopy (egd) with propofol (N/A, 02/18/2016); and Cystoscopy (05/10/2016)., family history includes Bladder Cancer in her paternal aunt; CVA in her father; Congestive Heart Failure in her mother; Diabetes in her father and mother; Hypertension in her father and mother.,  reports that  has never smoked. she has never used smokeless tobacco. She reports that she does not drink alcohol or use drugs.  She has a current medication list which includes the following prescription(s): hydrocortisone, ibuprofen, and norethindrone acetate-ethinyl estrad-fe. Also, has No Known Allergies.  ROS-see HPI  Physical Exam:  BP 112/72   Ht 5\' 3"  (1.6 m)   Wt 219 lb (99.3 kg)   LMP 07/23/2017 (Exact Date)   BMI 38.79 kg/m  Constitutional: Well nourished, well developed female in no acute distress.  Pelvic exam: Vulva: no lesions or inflammation Vagina: white discharge Neuro: Grossly intact Psych:  Normal mood and affect.   Results for orders placed or performed in visit on 08/16/17 (from the past 24 hour(s))  POCT Wet Prep [facility])     Status: Abnormal   Collection Time: 08/18/17  8:43 PM  Result Value Ref Range   Source Wet Prep POC Emira Eubanks, [place]    WBC, Wet Prep HPF POC     Bacteria Wet  Prep HPF POC  Few   BACTERIA WET PREP MORPHOLOGY POC     Clue Cells Wet Prep HPF POC Moderate (A) None   Clue Cells Wet Prep Whiff POC     Yeast Wet Prep HPF POC None    KOH Wet Prep POC     Trichomonas Wet Prep HPF POC Absent Absent   Assessment: No problems or side effects on the Loestrin 24  Bacterial vaginosis  Plan: RX: Loestrin 24 generic sent to pharmacy Tindamax 1GM daily x 5 days. No alcohol x 1 week Kenalog 0.1%-apply ext BID prn Nuswab done She was amenable to this plan and we will see her back for annual/PRN.  Dalia Heading, CNM [facility] 08/16/2017  8:19 AM

## 2017-08-18 LAB — POCT WET PREP (WET MOUNT): TRICHOMONAS WET PREP HPF POC: ABSENT

## 2017-08-20 LAB — NUSWAB VAGINITIS (VG)
Atopobium vaginae: HIGH Score — AB
BVAB 2: HIGH {score} — AB
CANDIDA ALBICANS, NAA: NEGATIVE
CANDIDA GLABRATA, NAA: NEGATIVE
MEGASPHAERA 1: HIGH {score} — AB
TRICH VAG BY NAA: NEGATIVE

## 2017-12-01 DIAGNOSIS — E237 Disorder of pituitary gland, unspecified: Secondary | ICD-10-CM | POA: Diagnosis not present

## 2017-12-01 DIAGNOSIS — E236 Other disorders of pituitary gland: Secondary | ICD-10-CM | POA: Diagnosis not present

## 2017-12-31 DIAGNOSIS — N390 Urinary tract infection, site not specified: Secondary | ICD-10-CM

## 2018-01-08 ENCOUNTER — Encounter: Payer: Self-pay | Admitting: Nurse Practitioner

## 2018-01-08 ENCOUNTER — Ambulatory Visit (INDEPENDENT_AMBULATORY_CARE_PROVIDER_SITE_OTHER): Payer: Medicare Other | Admitting: Nurse Practitioner

## 2018-01-08 VITALS — BP 136/92 | HR 83 | Resp 16 | Ht 63.0 in | Wt 223.0 lb

## 2018-01-08 DIAGNOSIS — R079 Chest pain, unspecified: Secondary | ICD-10-CM | POA: Insufficient documentation

## 2018-01-08 DIAGNOSIS — R1013 Epigastric pain: Secondary | ICD-10-CM | POA: Diagnosis not present

## 2018-01-08 NOTE — Progress Notes (Signed)
[facility] [place], [place] [address]  Internal MEDICINE  Office Visit Note  Patient Name: Shannon Foley  [id number]  [id number]  Date of Service: 01/08/2018  Chief Complaint  Patient presents with  . Abdominal Pain  . Chest Pain    The patient is here for follow up visit. Today, she is c/o abdominal pain. Pain is present all the time. This pain is located in epigastric and periumbilical area. Has been assessed in the past with negative ultrasound and negative CT of abdomen and pelvis. She is also c/o chest pain. Describes this as shooting pain, present for past 2 weeks. Nothing she can pinpoint makes the pain better or worse. Denies shortness of breath. Denies congestion or cough.    Pt is here for routine follow up.    Current Medication: Outpatient Encounter Medications as of 01/08/2018  Medication Sig  . ibuprofen (ADVIL,MOTRIN) 800 MG tablet Take 1 tablet (800 mg total) by mouth every 8 (eight) hours as needed for moderate pain.  . [DISCONTINUED] Norethindrone Acetate-Ethinyl Estrad-FE (LOESTRIN 24 FE) 1-20 MG-MCG(24) tablet Take 1 tablet by mouth daily. (Patient not taking: Reported on 01/08/2018)  . [DISCONTINUED] triamcinolone ointment (KENALOG) 0.1 % Apply 1 application topically 2 (two) times daily as needed. For itching (Patient not taking: Reported on 01/08/2018)   No facility-administered encounter medications on file as of 01/08/2018.     Surgical History: Past Surgical History:  Procedure Laterality Date  . CYSTOSCOPY  05/10/2016   WNL  . ESOPHAGOGASTRODUODENOSCOPY (EGD) WITH PROPOFOL N/A 02/18/2016   Procedure: ESOPHAGOGASTRODUODENOSCOPY (EGD) WITH PROPOFOL;  Surgeon: Lucilla Lame, MD;  Location: [facility];  Service: Endoscopy;  Laterality: N/A;  . TONSILLECTOMY    . TONSILLECTOMY      Medical History: Past Medical History:  Diagnosis Date  . Back pain    mid and lower back  . Fibroids    one submucosal and one  intramural (both  < 1 inch in size)  . Headache    caused by pituitary cyst  . Motion sickness cars  . Obesity   . Pituitary cyst (HCC)    Rathke's pouch cyst    Family History: Family History  Problem Relation Age of Onset  . Diabetes Mother   . Hypertension Mother   . Congestive Heart Failure Mother   . CVA Father        Had meningioma  . Diabetes Father   . Hypertension Father   . Bladder Cancer Paternal Aunt     Social History   Socioeconomic History  . Marital status: Single    Spouse name: Not on file  . Number of children: 0  . Years of education: Not on file  . Highest education level: Not on file  Occupational History  . Occupation: Chemical engineer  Social Needs  . Financial resource strain: Not on file  . Food insecurity:    Worry: Not on file    Inability: Not on file  . Transportation needs:    Medical: Not on file    Non-medical: Not on file  Tobacco Use  . Smoking status: Never Smoker  . Smokeless tobacco: Never Used  Substance and Sexual Activity  . Alcohol use: No  . Drug use: No  . Sexual activity: Yes    Birth control/protection: None  Lifestyle  . Physical activity:    Days per week: Not on file    Minutes per session: Not on file  . Stress: Not on  file  Relationships  . Social connections:    Talks on phone: Not on file    Gets together: Not on file    Attends religious service: Not on file    Active member of club or organization: Not on file    Attends meetings of clubs or organizations: Not on file    Relationship status: Not on file  . Intimate partner violence:    Fear of current or ex partner: Not on file    Emotionally abused: Not on file    Physically abused: Not on file    Forced sexual activity: Not on file  Other Topics Concern  . Not on file  Social History Narrative  . Not on file      Review of Systems  Constitutional: Positive for fatigue. Negative for activity change and appetite change.  HENT: Negative  for congestion, ear pain, postnasal drip, rhinorrhea, sinus pain and sore throat.   Eyes: Negative.   Respiratory: Positive for chest tightness. Negative for cough, shortness of breath and wheezing.   Cardiovascular: Positive for chest pain. Negative for palpitations.  Gastrointestinal: Positive for abdominal pain and nausea.  Endocrine: Negative for cold intolerance, heat intolerance, polydipsia, polyphagia and polyuria.  Genitourinary: Positive for flank pain. Negative for dysuria, frequency, hematuria and menstrual problem.  Musculoskeletal: Positive for back pain.  Skin: Negative.   Allergic/Immunologic: Negative.   Neurological: Positive for headaches. Negative for weakness.  Hematological: Negative for adenopathy.  Psychiatric/Behavioral: Negative.    Today's Vitals   01/08/18 0834  BP: (!) 136/92  Pulse: 83  Resp: 16  SpO2: 98%  Weight: 223 lb (101.2 kg)  Height: 5\' 3"  (1.6 m)    Physical Exam  Constitutional: She is oriented to person, place, and time. She appears well-developed and well-nourished. No distress.  HENT:  Head: Normocephalic and atraumatic.  Mouth/Throat: Oropharynx is clear and moist. No oropharyngeal exudate.  Eyes: Pupils are equal, round, and reactive to light. EOM are normal.  Neck: Normal range of motion. Neck supple. No JVD present. No tracheal deviation present. No thyromegaly present.  Cardiovascular: Normal rate, regular rhythm and normal heart sounds. Exam reveals no gallop and no friction rub.  No murmur heard. Borderline ECG today without acute ischemia or ST changes.   Pulmonary/Chest: Effort normal and breath sounds normal. No respiratory distress. She has no wheezes. She has no rales. She exhibits no tenderness.  Abdominal: Soft. Bowel sounds are normal. She exhibits no mass. There is tenderness in the epigastric area. No hernia.  Musculoskeletal: Normal range of motion.  Lymphadenopathy:    She has no cervical adenopathy.  Neurological:  She is alert and oriented to person, place, and time. No cranial nerve deficit.  Skin: Skin is warm and dry. Capillary refill takes less than 2 seconds. She is not diaphoretic.  Psychiatric: She has a normal mood and affect. Her behavior is normal. Judgment and thought content normal.  Nursing note and vitals reviewed.   Assessment/Plan:  1. Chest pain, unspecified type - EKG 12-Lead reassuring. No evidence oof ischemia or acute abnormalities. Concern for hiatal hernia or GERD.   2. Epigastric abdominal pain - DG UGI  W/KUB; Future - introduce treatment as indicated.   General Counseling: Camara verbalizes understanding of the findings of todays visit and agrees with plan of treatment. I have discussed any further diagnostic evaluation that may be needed or ordered today. We also reviewed her medications today. she has been encouraged to call the office with any  questions or concerns that should arise related to todays visit.     Orders Placed This Encounter  Procedures  . DG UGI  W/KUB  . EKG 12-Lead      Time spent: 47 Minutes       Dr [name] Internal medicine

## 2018-02-20 ENCOUNTER — Ambulatory Visit: Payer: Medicare Other

## 2018-02-22 ENCOUNTER — Ambulatory Visit
Admission: RE | Admit: 2018-02-22 | Discharge: 2018-02-22 | Disposition: A | Discharge status: Home / Self Care | Payer: Medicare Other | Source: Ambulatory Visit | Attending: Nurse Practitioner | Admitting: Nurse Practitioner

## 2018-02-22 DIAGNOSIS — R1013 Epigastric pain: Secondary | ICD-10-CM | POA: Diagnosis not present

## 2018-02-22 DIAGNOSIS — K219 Gastro-esophageal reflux disease without esophagitis: Secondary | ICD-10-CM | POA: Diagnosis not present

## 2018-02-22 DIAGNOSIS — K449 Diaphragmatic hernia without obstruction or gangrene: Secondary | ICD-10-CM | POA: Insufficient documentation

## 2018-03-12 ENCOUNTER — Ambulatory Visit: Payer: Medicare Other | Admitting: Nurse Practitioner

## 2018-03-12 ENCOUNTER — Encounter: Payer: Self-pay | Admitting: Nurse Practitioner

## 2018-03-12 VITALS — BP 127/81 | HR 88 | Resp 16 | Ht 63.5 in | Wt 224.2 lb

## 2018-03-12 DIAGNOSIS — N390 Urinary tract infection, site not specified: Secondary | ICD-10-CM

## 2018-03-12 DIAGNOSIS — G8929 Other chronic pain: Secondary | ICD-10-CM | POA: Diagnosis not present

## 2018-03-12 DIAGNOSIS — K219 Gastro-esophageal reflux disease without esophagitis: Secondary | ICD-10-CM | POA: Diagnosis not present

## 2018-03-12 DIAGNOSIS — Z0001 Encounter for general adult medical examination with abnormal findings: Secondary | ICD-10-CM

## 2018-03-12 DIAGNOSIS — M546 Pain in thoracic spine: Secondary | ICD-10-CM

## 2018-03-12 DIAGNOSIS — R300 Dysuria: Secondary | ICD-10-CM

## 2018-03-12 DIAGNOSIS — M545 Low back pain, unspecified: Secondary | ICD-10-CM

## 2018-03-12 MED ORDER — OMEPRAZOLE 40 MG PO CPDR: 40.0000 mg | DELAYED_RELEASE_CAPSULE | Freq: Every day | ORAL | 3 refills | Status: DC

## 2018-03-12 NOTE — Progress Notes (Signed)
[facility] [place], [place] [address]  Internal MEDICINE  Office Visit Note  Patient Name: Shannon Foley  [id number]  [id number]  Date of Service: 04/03/2018    Pt is here for routine health maintenance examination  Chief Complaint  Patient presents with  . Annual Exam    6wk follow up  . Gastroesophageal Reflux  . Back Pain     The patient is here for follow up visit. Today, she is c/o abdominal pain. Pain is present all the time. This pain is located in epigastric and periumbilical area. Has been assessed in the past with negative ultrasound and negative CT of abdomen and pelvis. She is also c/o chest pain. Describes this as shooting pain, present for past 2 weeks. Nothing she can pinpoint makes the pain better or worse. Denies shortness of breath. Denies congestion or cough. UGI was done 02/22/2018. This did show minimal hiatal hernia and mild GERD. She is not taking anything to help with acid reflux.  She is also c/o mid back pain. Has been going on for some time. Close to few years. Nothing she can think of makes it worse. Nothing helps. Ibuprofen she is taking does not help.    Current Medication: Outpatient Encounter Medications as of 03/12/2018  Medication Sig  . ibuprofen (ADVIL,MOTRIN) 800 MG tablet Take 1 tablet (800 mg total) by mouth every 8 (eight) hours as needed for moderate pain.  . nitrofurantoin, macrocrystal-monohydrate, (MACROBID) 100 MG capsule Take 1 capsule (100 mg total) by mouth 2 (two) times daily.  Marland Kitchen omeprazole (PRILOSEC) 40 MG capsule Take 1 capsule (40 mg total) by mouth daily.   No facility-administered encounter medications on file as of 03/12/2018.     Surgical History: Past Surgical History:  Procedure Laterality Date  . CYSTOSCOPY  05/10/2016   WNL  . ESOPHAGOGASTRODUODENOSCOPY (EGD) WITH PROPOFOL N/A 02/18/2016   Procedure: ESOPHAGOGASTRODUODENOSCOPY (EGD) WITH PROPOFOL;  Surgeon: Lucilla Lame, MD;  Location:  [facility];  Service: Endoscopy;  Laterality: N/A;  . TONSILLECTOMY    . TONSILLECTOMY      Medical History: Past Medical History:  Diagnosis Date  . Back pain    mid and lower back  . Fibroids    one submucosal and one intramural (both  < 1 inch in size)  . Headache    caused by pituitary cyst  . Motion sickness cars  . Obesity   . Pituitary cyst (HCC)    Rathke's pouch cyst    Family History: Family History  Problem Relation Age of Onset  . Diabetes Mother   . Hypertension Mother   . Congestive Heart Failure Mother   . CVA Father        Had meningioma  . Diabetes Father   . Hypertension Father   . Bladder Cancer Paternal Aunt       Review of Systems  Constitutional: Positive for fatigue. Negative for activity change and appetite change.  HENT: Negative for congestion, ear pain, postnasal drip, rhinorrhea, sinus pain and sore throat.   Eyes: Negative.   Respiratory: Negative for cough, chest tightness, shortness of breath and wheezing.   Cardiovascular: Negative for chest pain and palpitations.  Gastrointestinal: Positive for abdominal pain and nausea. Negative for constipation, diarrhea and vomiting.       Tenderness in epigastric area. Increased gas and heartburn type symptoms.  Endocrine: Negative for cold intolerance, heat intolerance, polydipsia, polyphagia and polyuria.  Genitourinary: Positive for flank pain. Negative for dysuria,  frequency, hematuria and menstrual problem.  Musculoskeletal: Positive for arthralgias and back pain.  Skin: Negative.   Allergic/Immunologic: Positive for environmental allergies.  Neurological: Positive for headaches. Negative for weakness.  Hematological: Negative for adenopathy.  Psychiatric/Behavioral: Negative for dysphoric mood. The patient is not nervous/anxious.      Today's Vitals   03/12/18 1013  BP: 127/81  Pulse: 88  Resp: 16  SpO2: 100%  Weight: 224 lb 3.2 oz (101.7 kg)  Height: 5' 3.5" (1.613 m)     Physical Exam  Constitutional: She is oriented to person, place, and time. She appears well-developed and well-nourished. No distress.  HENT:  Head: Normocephalic and atraumatic.  Nose: Nose normal.  Mouth/Throat: Oropharynx is clear and moist. No oropharyngeal exudate.  Eyes: Pupils are equal, round, and reactive to light. Conjunctivae and EOM are normal.  Neck: Normal range of motion. Neck supple. No JVD present. No tracheal deviation present. No thyromegaly present.  Cardiovascular: Normal rate, regular rhythm, normal heart sounds and intact distal pulses. Exam reveals no gallop and no friction rub.  No murmur heard. Borderline ECG today without acute ischemia or ST changes.   Pulmonary/Chest: Effort normal and breath sounds normal. No respiratory distress. She has no wheezes. She has no rales. She exhibits no tenderness.  Abdominal: Soft. Bowel sounds are normal. She exhibits no mass. There is tenderness in the epigastric area. No hernia.  Tenderness present along epigastric area of abdomen.  Musculoskeletal: Normal range of motion.  Lymphadenopathy:    She has no cervical adenopathy.  Neurological: She is alert and oriented to person, place, and time. No cranial nerve deficit.  Skin: Skin is warm and dry. Capillary refill takes less than 2 seconds. She is not diaphoretic.  Psychiatric: She has a normal mood and affect. Her behavior is normal. Judgment and thought content normal.  Nursing note and vitals reviewed.    LABS: Recent Results (from the past 2160 hour(s))  Urinalysis, Routine w reflex microscopic     Status: Abnormal   Collection Time: 03/12/18 12:30 PM  Result Value Ref Range   Specific Gravity, UA 1.024 1.005 - 1.030   pH, UA 8.0 (H) 5.0 - 7.5   Color, UA Yellow Yellow   Appearance Ur Turbid (A) Clear   Leukocytes, UA 3+ (A) Negative   Protein, UA 1+ (A) Negative/Trace   Glucose, UA Negative Negative   Ketones, UA Negative Negative   RBC, UA Negative  Negative   Bilirubin, UA Negative Negative   Urobilinogen, Ur 0.2 0.2 - 1.0 mg/dL   Nitrite, UA Negative Negative   Microscopic Examination See below:     Comment: Microscopic was indicated and was performed.  Microscopic Examination     Status: Abnormal   Collection Time: 03/12/18 12:30 PM  Result Value Ref Range   WBC, UA 6-10 (A) 0 - 5 /hpf   RBC, UA 0-2 0 - 2 /hpf   Epithelial Cells (non renal) >10 (A) 0 - 10 /hpf   Casts None seen None seen /lpf   Mucus, UA Present Not Estab.   Bacteria, UA Few None seen/Few    Assessment/Plan: 1. Encounter for general adult medical examination with abnormal findings Annual wellness visit today.  2. Gastroesophageal reflux disease without esophagitis Add omeprazole 40mg  daily. Recommend sleeping with HOB elevated at [place]. Avoid trigger foodsd.  - omeprazole (PRILOSEC) 40 MG capsule; Take 1 capsule (40 mg total) by mouth daily.  Dispense: 30 capsule; Refill: 3  3. Chronic midline low back pain  without sciatica X-ray lumbar spine for further evaluation.  4. Chronic midline thoracic back pain X-ray thoracic spine for further evaluation  - DG Thoracic Spine W/Swimmers; Future  5. Urinary tract infection without hematuria, site unspecified Add macrobid 100mg  twice daily for 10 days. Increase water intake . - nitrofurantoin, macrocrystal-monohydrate, (MACROBID) 100 MG capsule; Take 1 capsule (100 mg total) by mouth 2 (two) times daily.  Dispense: 20 capsule; Refill: 0  6. Dysuria - Urinalysis, Routine w reflex microscopic  General Counseling: Maili verbalizes understanding of the findings of todays visit and agrees with plan of treatment. I have discussed any further diagnostic evaluation that may be needed or ordered today. We also reviewed her medications today. she has been encouraged to call the office with any questions or concerns that should arise related to todays visit.    Counseling:  This patient was seen by Leretha Pol FNP Collaboration with Dr [name] as a part of collaborative care agreement   Orders Placed This Encounter  Procedures  . Microscopic Examination  . DG Thoracic Spine W/Swimmers  . Urinalysis, Routine w reflex microscopic    Meds ordered this encounter  Medications  . omeprazole (PRILOSEC) 40 MG capsule    Sig: Take 1 capsule (40 mg total) by mouth daily.    Dispense:  30 capsule    Refill:  3    Order Specific Question:   Supervising Provider    Answer:   [name] [[id number]]  . nitrofurantoin, macrocrystal-monohydrate, (MACROBID) 100 MG capsule    Sig: Take 1 capsule (100 mg total) by mouth 2 (two) times daily.    Dispense:  20 capsule    Refill:  0    Order Specific Question:   Supervising Provider    Answer:   [name] [[id number]]    Time spent: [place], MD  Internal Medicine

## 2018-03-13 ENCOUNTER — Telehealth: Payer: Self-pay

## 2018-03-13 LAB — MICROSCOPIC EXAMINATION
CASTS: NONE SEEN /LPF
Epithelial Cells (non renal): >10 /hpf — AB (ref 0–10)

## 2018-03-13 LAB — URINALYSIS, ROUTINE W REFLEX MICROSCOPIC
Bilirubin, UA: NEGATIVE
Glucose, UA: NEGATIVE
Ketones, UA: NEGATIVE
Nitrite, UA: NEGATIVE
RBC, UA: NEGATIVE
Specific Gravity, UA: 1.024 (ref 1.005–1.030)
Urobilinogen, Ur: 0.2 mg/dL (ref 0.2–1.0)
pH, UA: 8 — ABNORMAL HIGH (ref 5.0–7.5)

## 2018-03-13 MED ORDER — NITROFURANTOIN MONOHYD MACRO 100 MG PO CAPS: 100.0000 mg | ORAL_CAPSULE | Freq: Two times a day (BID) | ORAL | 0 refills | Status: DC

## 2018-03-13 NOTE — Telephone Encounter (Signed)
Left voicemail informing pt of results and prescription sent to her parmacy

## 2018-04-03 DIAGNOSIS — R300 Dysuria: Secondary | ICD-10-CM | POA: Insufficient documentation

## 2018-04-03 DIAGNOSIS — K219 Gastro-esophageal reflux disease without esophagitis: Secondary | ICD-10-CM | POA: Insufficient documentation

## 2018-04-03 DIAGNOSIS — M545 Low back pain, unspecified: Secondary | ICD-10-CM | POA: Insufficient documentation

## 2018-04-03 DIAGNOSIS — M546 Pain in thoracic spine: Secondary | ICD-10-CM

## 2018-04-03 DIAGNOSIS — G8929 Other chronic pain: Secondary | ICD-10-CM | POA: Insufficient documentation

## 2018-04-03 DIAGNOSIS — Z0001 Encounter for general adult medical examination with abnormal findings: Secondary | ICD-10-CM | POA: Insufficient documentation

## 2018-04-03 DIAGNOSIS — N390 Urinary tract infection, site not specified: Secondary | ICD-10-CM | POA: Insufficient documentation

## 2018-04-23 DIAGNOSIS — H60543 Acute eczematoid otitis externa, bilateral: Secondary | ICD-10-CM | POA: Diagnosis not present

## 2018-05-07 ENCOUNTER — Telehealth: Payer: Self-pay | Admitting: Nurse Practitioner

## 2018-05-08 NOTE — Telephone Encounter (Signed)
Shannon Foley showed minimal hiatal hernia with mild GERD. If she wants referral, that's fine.

## 2018-05-21 DIAGNOSIS — G8929 Other chronic pain: Secondary | ICD-10-CM | POA: Diagnosis not present

## 2018-05-21 DIAGNOSIS — R109 Unspecified abdominal pain: Secondary | ICD-10-CM | POA: Diagnosis not present

## 2018-05-21 DIAGNOSIS — K5900 Constipation, unspecified: Secondary | ICD-10-CM | POA: Diagnosis not present

## 2018-05-27 ENCOUNTER — Ambulatory Visit (INDEPENDENT_AMBULATORY_CARE_PROVIDER_SITE_OTHER): Payer: Medicare Other | Admitting: Certified Nurse Midwife

## 2018-05-27 ENCOUNTER — Encounter: Payer: Self-pay | Admitting: Certified Nurse Midwife

## 2018-05-27 ENCOUNTER — Other Ambulatory Visit (HOSPITAL_COMMUNITY)
Admission: RE | Admit: 2018-05-27 | Discharge: 2018-05-27 | Disposition: A | Discharge status: Home / Self Care | Payer: Medicare Other | Source: Ambulatory Visit | Attending: Certified Nurse Midwife | Admitting: Certified Nurse Midwife

## 2018-05-27 ENCOUNTER — Other Ambulatory Visit: Payer: Self-pay

## 2018-05-27 VITALS — BP 124/76 | HR 98 | Ht 63.0 in | Wt 221.0 lb

## 2018-05-27 DIAGNOSIS — Z87410 Personal history of cervical dysplasia: Secondary | ICD-10-CM | POA: Diagnosis not present

## 2018-05-27 DIAGNOSIS — Z124 Encounter for screening for malignant neoplasm of cervix: Secondary | ICD-10-CM | POA: Insufficient documentation

## 2018-05-27 DIAGNOSIS — N760 Acute vaginitis: Secondary | ICD-10-CM | POA: Insufficient documentation

## 2018-05-27 DIAGNOSIS — B9689 Other specified bacterial agents as the cause of diseases classified elsewhere: Secondary | ICD-10-CM

## 2018-05-27 DIAGNOSIS — Z01419 Encounter for gynecological examination (general) (routine) without abnormal findings: Secondary | ICD-10-CM | POA: Diagnosis not present

## 2018-05-27 DIAGNOSIS — R87612 Low grade squamous intraepithelial lesion on cytologic smear of cervix (LGSIL): Secondary | ICD-10-CM

## 2018-05-27 DIAGNOSIS — Z113 Encounter for screening for infections with a predominantly sexual mode of transmission: Secondary | ICD-10-CM | POA: Insufficient documentation

## 2018-05-27 DIAGNOSIS — L292 Pruritus vulvae: Secondary | ICD-10-CM | POA: Diagnosis not present

## 2018-05-27 DIAGNOSIS — D251 Intramural leiomyoma of uterus: Secondary | ICD-10-CM

## 2018-05-27 DIAGNOSIS — N870 Mild cervical dysplasia: Secondary | ICD-10-CM

## 2018-05-27 DIAGNOSIS — D250 Submucous leiomyoma of uterus: Secondary | ICD-10-CM

## 2018-05-27 HISTORY — DX: Low grade squamous intraepithelial lesion on cytologic smear of cervix (LGSIL): R87.612

## 2018-05-27 MED ORDER — FLUCONAZOLE 150 MG PO TABS: ORAL_TABLET | ORAL | 0 refills | Status: DC

## 2018-05-27 MED ORDER — NORETHIN ACE-ETH ESTRAD-FE 1-20 MG-MCG(24) PO TABS: 1.0000 | ORAL_TABLET | Freq: Every day | ORAL | 11 refills | Status: DC

## 2018-05-27 MED ORDER — TINIDAZOLE 500 MG PO TABS: ORAL_TABLET | ORAL | 0 refills | Status: DC

## 2018-05-27 NOTE — Progress Notes (Signed)
Gynecology Annual Exam  PCP: [name], MD  Chief Complaint:  Chief Complaint  Patient presents with  . Abdominal Pain    several months constant unrelated to meals/activity, d/t fibroids?    History of Present Illness:Shannon Foley is a 32 year old African American/Black female, G0 P0000, whose LMP was 05/11/2018. She presents for her annual and Pap smear, but also has a couple of concerns: 1) white vaginal discharge with odor and vulvar irritations x 3-4 weeks. Denies change in soaps or hygiene products. Uses sensitive skin soap. Has not used any antibiotics in the last two months.  And 2) heavier flow x 3 days with her August 24 menses requiring tampon change every hour. She is wondering if this is due to her fibroids. She was on BCPs until she ran out over the summer.  Her menses are usually regular. They occur every 1 month, they last 5 days, are usually a medium flow, and are without clots. She has not had any intermenstrual spotting. She reports dysmenorrhea. She uses nsaids (ibuprofen 400 mgm daily)  with symptomatic relief. The patient's past medical history is remarkable for Rathke's pouch cyst, frequent UTIs, chronic abdominal pain (epigastric and lower abdominal pain)/ IBS-c, and obesity. She had a work up for abdominal pain which included, pelvic ultrasound (two small intramural fibroids with one possibly encroaching on endometrium), 2 CT scans, upper endoscopy, and UGI (minimal HH, mild GERD). She is sexually active. She is currently using nothing for contraception. She would like to restart birth control pills.  Her last Pap smear was 03/09/2017 and was LGSIL/ + HRHPV. Colposcopy and biopsy c/w CIN1 in July 2018 Mammogram is not applicable. There is no family history of breast cancer. There is no family history of ovarian cancer. The patient does not do monthly self breast exams.  The patient does not smoke.  The patient does not drink alcohol.  The patient does not  use illegal drugs.  The patient does occ exercise at home. The patient does not get adequate calcium in her diet.   The patient denies current symptoms of depression.    Review of Systems: Review of Systems  Constitutional: Positive for malaise/fatigue. Negative for chills, fever and weight loss (six pounds).  HENT: Negative for congestion, sinus pain and sore throat.   Eyes: Negative for blurred vision and pain.  Respiratory: Negative for hemoptysis, shortness of breath and wheezing.   Cardiovascular: Negative for chest pain, palpitations and leg swelling.  Gastrointestinal: Positive for constipation. Negative for abdominal pain, blood in stool, diarrhea, heartburn, nausea and vomiting.  Genitourinary: Negative for dysuria, frequency, hematuria and urgency.       Positive for vulvar itching, increased vaginal discharge, heavier menses  Musculoskeletal: Negative for back pain, joint pain and myalgias.  Skin: Negative for itching and rash.  Neurological: Positive for headaches. Negative for dizziness and tingling.  Endo/Heme/Allergies: Negative for environmental allergies and polydipsia. Does not bruise/bleed easily.       Negative for hirsutism   Psychiatric/Behavioral: Negative for depression. The patient is not nervous/anxious and does not have insomnia.     Past Medical History:  Past Medical History:  Diagnosis Date  . Back pain    mid and lower back  . Chronic abdominal pain   . Chronic constipation   . Fibroids    one submucosal and one intramural (both  < 1 inch in size)  . Headache    caused by pituitary cyst  .  IBS (irritable bowel syndrome)   . Motion sickness cars  . Obesity   . Pituitary cyst ([place])    Rathke's pouch cyst    Past Surgical History:  Past Surgical History:  Procedure Laterality Date  . CYSTOSCOPY  05/10/2016   WNL  . ESOPHAGOGASTRODUODENOSCOPY (EGD) WITH PROPOFOL N/A 02/18/2016   Procedure: ESOPHAGOGASTRODUODENOSCOPY (EGD) WITH PROPOFOL;   Surgeon: Lucilla Lame, MD;  Location: [facility];  Service: Endoscopy;  Laterality: N/A;  . TONSILLECTOMY    . TONSILLECTOMY      Family History:  Family History  Problem Relation Age of Onset  . Diabetes Mother   . Hypertension Mother   . Congestive Heart Failure Mother   . CVA Father        Had meningioma  . Diabetes Father   . Hypertension Father   . Bladder Cancer Paternal Aunt     Social History:  Social History   Socioeconomic History  . Marital status: Single    Spouse name: Not on file  . Number of children: 0  . Years of education: Not on file  . Highest education level: Not on file  Occupational History  . Occupation: Chemical engineer  Social Needs  . Financial resource strain: Not on file  . Food insecurity:    Worry: Not on file    Inability: Not on file  . Transportation needs:    Medical: Not on file    Non-medical: Not on file  Tobacco Use  . Smoking status: Never Smoker  . Smokeless tobacco: Never Used  Substance and Sexual Activity  . Alcohol use: No  . Drug use: No  . Sexual activity: Yes    Birth control/protection: None  Lifestyle  . Physical activity:    Days per week: Not on file    Minutes per session: Not on file  . Stress: Not on file  Relationships  . Social connections:    Talks on phone: Not on file    Gets together: Not on file    Attends religious service: Not on file    Active member of club or organization: Not on file    Attends meetings of clubs or organizations: Not on file    Relationship status: Not on file  . Intimate partner violence:    Fear of current or ex partner: Not on file    Emotionally abused: Not on file    Physically abused: Not on file    Forced sexual activity: Not on file  Other Topics Concern  . Not on file  Social History Narrative  . Not on file    Allergies:  No Known Allergies  Medications:  Current Outpatient Medications on File Prior to Visit  Medication Sig Dispense Refill  .  ibuprofen (ADVIL,MOTRIN) 800 MG tablet Take 1 tablet (800 mg total) by mouth every 8 (eight) hours as needed for moderate pain. 15 tablet 0  . Methylcellulose, Laxative, 500 MG TABS Take 1 tablet by mouth 2 (two) times daily as needed.    Marland Kitchen omeprazole (PRILOSEC) 40 MG capsule Take 1 capsule (40 mg total) by mouth daily. 30 capsule 3   No current facility-administered medications on file prior to visit.       Physical Exam Vitals: BP 124/76 (BP Location: Right Arm, Patient Position: Sitting, Cuff Size: Normal)   Pulse 98   Ht 5\' 3"  (1.6 m)   Wt 221 lb (100.2 kg)   LMP 05/11/2018   BMI 39.15 kg/m   General:  BF in NAD HEENT: normocephalic, anicteric Neck: no thyroid enlargement, no palpable nodules, no cervical lymphadenopathy  Pulmonary: No increased work of breathing, CTAB Cardiovascular: RRR, without murmur  Breast: Breast symmetrical, no tenderness, no palpable nodules or masses, no skin or nipple retraction present, no nipple discharge.  No axillary, infraclavicular or supraclavicular lymphadenopathy. Abdomen: Soft, non-tender, non-distended.  Umbilicus without lesions.  No hepatomegaly or masses palpable. No evidence of hernia. Genitourinary:  External: Normal external female genitalia.  Normal urethral meatus, normal Bartholin's and Skene's glands.    Vagina: Normal vaginal mucosa, no evidence of prolapse, white homogenous discharge    Cervix: Grossly normal in appearance, no bleeding, non-tender  Uterus: Anteverted, slightly irregular on right fundal area, normal size,  mobile, and non-tender  Adnexa: No adnexal masses, non-tender  Rectal: deferred  Lymphatic: no evidence of inguinal lymphadenopathy Extremities: no edema, erythema, or tenderness Neurologic: Grossly intact Psychiatric: mood appropriate, affect full  Results for orders placed or performed in visit on 05/27/18 (from the past 24 hour(s))  POCT Wet Prep [facility] [place])     Status: Abnormal   Collection Time:  05/28/18  1:31 PM  Result Value Ref Range   Source Wet Prep POC vagina    WBC, Wet Prep HPF POC     Bacteria Wet Prep HPF POC     BACTERIA WET PREP MORPHOLOGY POC     Clue Cells Wet Prep HPF POC Many (A) None   Clue Cells Wet Prep Whiff POC     Yeast Wet Prep HPF POC None None   KOH Wet Prep POC     Trichomonas Wet Prep HPF POC Absent Absent     Assessment: 32 y.o. G0P0000 well woman exam Bacterial vaginosis Contraceptive management Fibroids Hx of CIN 1   Plan:    1) Breast cancer screening - recommend monthly self breast exam.   2)Cervical cancer screening/ Hx of CIN1 last year. - Pap was done.   3) Contraception -patient wanted to restart BCPs. Did well on 20 mcg pill. RX for Microgestin 24. Can start on Sunday after next menses starts.  4) Pelvic ultrasound ordered to follow up on fibroids per patient request. Follow up with me after ultrasound  5) Routine healthcare maintenance including cholesterol and diabetes screening ordered today.  6) Tindamax 1 GM x 5 days for BV and Diflucan 150 mgm every other day x 2 doses for PPX  Dalia Heading, CNM

## 2018-05-28 ENCOUNTER — Encounter: Payer: Self-pay | Admitting: Certified Nurse Midwife

## 2018-05-28 LAB — POCT WET PREP (WET MOUNT): TRICHOMONAS WET PREP HPF POC: ABSENT

## 2018-05-29 DIAGNOSIS — K589 Irritable bowel syndrome without diarrhea: Secondary | ICD-10-CM | POA: Insufficient documentation

## 2018-05-30 LAB — CYTOLOGY - PAP
Chlamydia: NEGATIVE
HPV (WINDOPATH): DETECTED — AB
HPV 16/18/45 genotyping: NEGATIVE
Neisseria Gonorrhea: NEGATIVE

## 2018-06-13 ENCOUNTER — Encounter: Payer: Self-pay | Admitting: Certified Nurse Midwife

## 2018-06-13 ENCOUNTER — Other Ambulatory Visit (HOSPITAL_COMMUNITY)
Admission: RE | Admit: 2018-06-13 | Discharge: 2018-06-13 | Disposition: A | Discharge status: Home / Self Care | Payer: Medicare Other | Source: Ambulatory Visit | Attending: Certified Nurse Midwife | Admitting: Certified Nurse Midwife

## 2018-06-13 ENCOUNTER — Ambulatory Visit (INDEPENDENT_AMBULATORY_CARE_PROVIDER_SITE_OTHER): Payer: Medicare Other

## 2018-06-13 ENCOUNTER — Ambulatory Visit (INDEPENDENT_AMBULATORY_CARE_PROVIDER_SITE_OTHER): Payer: Medicare Other | Admitting: Certified Nurse Midwife

## 2018-06-13 VITALS — BP 120/90 | HR 78 | Ht 63.0 in | Wt 218.0 lb

## 2018-06-13 DIAGNOSIS — D250 Submucous leiomyoma of uterus: Secondary | ICD-10-CM

## 2018-06-13 DIAGNOSIS — B373 Candidiasis of vulva and vagina: Secondary | ICD-10-CM | POA: Diagnosis not present

## 2018-06-13 DIAGNOSIS — B3731 Acute candidiasis of vulva and vagina: Secondary | ICD-10-CM

## 2018-06-13 DIAGNOSIS — R87612 Low grade squamous intraepithelial lesion on cytologic smear of cervix (LGSIL): Secondary | ICD-10-CM

## 2018-06-13 DIAGNOSIS — L292 Pruritus vulvae: Secondary | ICD-10-CM | POA: Diagnosis not present

## 2018-06-13 DIAGNOSIS — Z113 Encounter for screening for infections with a predominantly sexual mode of transmission: Secondary | ICD-10-CM | POA: Diagnosis present

## 2018-06-13 DIAGNOSIS — D251 Intramural leiomyoma of uterus: Secondary | ICD-10-CM

## 2018-06-13 DIAGNOSIS — D259 Leiomyoma of uterus, unspecified: Secondary | ICD-10-CM | POA: Insufficient documentation

## 2018-06-13 MED ORDER — TERCONAZOLE 0.8 % VA CREA: 1.0000 | TOPICAL_CREAM | Freq: Every day | VAGINAL | 0 refills | Status: AC

## 2018-06-13 NOTE — Progress Notes (Signed)
Shannon Foley is a 32 year old African American/Black female, G0 P0000, whose LMP was 06/05/2018. She presented for a follow up after having a pelvic ultrasound. She has a history of having fibroids (in 2017 had two: submucosal 23 x 72mm, and another intramural 20 x 17) and she reported having heavier menstrual flow when she came off her OCPs this summer. She has just restarted her OCPs this month. She also reports having vulvar itching and swelling since she had intercourse last week (didi not use a condom).Was treated for BV earlier this month with Tindamax and given Diflucan PPX. Wants to be checked for STD. She also has a history of abnormal Pap smears and had CIN1 on biopsy last year. Her Pap on 05/27/18 returned LGSIL with positive HRHPV, not 16, 18, 45 PMHx: She  has a past medical history of Back pain, Chronic abdominal pain, Chronic constipation, Fibroids, Headache, IBS (irritable bowel syndrome), Motion sickness (cars), Obesity, and Pituitary cyst ([place]). Also,  has a past surgical history that includes Tonsillectomy; Esophagogastroduodenoscopy (egd) with propofol (N/A, 02/18/2016); Cystoscopy (05/10/2016); and Tonsillectomy., family history includes Bladder Cancer in her paternal aunt; CVA in her father; Congestive Heart Failure in her mother; Diabetes in her father and mother; Hypertension in her father and mother.,  reports that she has never smoked. She has never used smokeless tobacco. She reports that she does not drink alcohol or use drugs.  She has a current medication list which includes the following prescription(s): ibuprofen, norethindrone acetate-ethinyl estrad-fe, and tinidazole. Also, has No Known Allergies.  ROS  Objective: BP 120/90   Pulse 78   Ht 5\' 3"  (1.6 m)   Wt 218 lb (98.9 kg)   LMP 06/05/2018   BMI 38.62 kg/m   Physical examination General:  BF in NAD  Pelvic exam External/BUS: inflammation of introitus, labia minora appear slightly swollen   Vagina:  white discharge Cervix: no lesions  Neuro: Grossly intact  Psych: Oriented to PPT.  Normal mood. Normal affect.   Results for orders placed or performed in visit on 06/13/18 (from the past 24 hour(s))  POCT Wet Prep [facility] [place])     Status: Abnormal   Collection Time: 06/14/18  2:56 PM  Result Value Ref Range   Source Wet Prep POC vagina    WBC, Wet Prep HPF POC     Bacteria Wet Prep HPF POC     BACTERIA WET PREP MORPHOLOGY POC     Clue Cells Wet Prep HPF POC None None   Clue Cells Wet Prep Whiff POC     Yeast Wet Prep HPF POC Moderate (A) None   KOH Wet Prep POC     Trichomonas Wet Prep HPF POC Absent Absent   Indications:Uterine Fibroids f/u Findings:  The uterus is anteverted and measures 8.03 x 4.12 x 5.96 cm. Echo texture is heterogenous with evidence of focal masses. Within the uterus are multiple suspected fibroids measuring: Fibroid 1: submucosal, on the Left posterior uterine wall = 33.58 x 31.93 mm Fibroid 2: Intramural vs subserosal, on the fudal Left uterine wall = 30.58 x 26.43 mm Fibroid 3: intramural vs Subserosal, on the fundal uterine wall = 6.81 x 5.92 mm The Endometrium measures 4.71 mm.  Right Ovary measures 3 x 2.7 x 1.5 cm. It is normal in appearance. Left Ovary measures 3.16 x 3.1 x 2 cm. It is normal in appearance. Survey of the adnexa demonstrates no adnexal masses. There is no free fluid in the cul de sac.  Assessment/ Plan: Small increase in size of fibroids with one submucosal fibroid that can cause increased bleeding.  Will see whether bleeding is controlled with OCPs. Continue Microgestin 24  Discussed possible problems that submucosal and intramural fibroids can cause and given information in writing. Monilial vulvovaginitis-Terazol 3 cream. GC/Chlamydia test done LGSIL pap with positive HRHPV-scheduled colposcopy with Dr Glennon Mac.  Dalia Heading, CNM

## 2018-06-13 NOTE — Patient Instructions (Signed)
Uterine Fibroids Uterine fibroids are tissue masses (tumors) that can develop in the womb (uterus). They are also called leiomyomas. This type of tumor is not cancerous (benign) and does not spread to other parts of the body outside of the pelvic area, which is between the hip bones. Occasionally, fibroids may develop in the fallopian tubes, in the cervix, or on the support structures (ligaments) that surround the uterus. You can have one or many fibroids. Fibroids can vary in size, weight, and where they grow in the uterus. Some can become quite large. Most fibroids do not require medical treatment. What are the causes? A fibroid can develop when a single uterine cell keeps growing (replicating). Most cells in the human body have a control mechanism that keeps them from replicating without control. What are the signs or symptoms? Symptoms may include:  Heavy bleeding during your period.  Bleeding or spotting between periods.  Pelvic pain and pressure.  Bladder problems, such as needing to urinate more often (urinary frequency) or urgently.  Inability to reproduce offspring (infertility).  Miscarriages.  How is this diagnosed? Uterine fibroids are diagnosed through a physical exam. Your health care provider may feel the lumpy tumors during a pelvic exam. Ultrasonography and an MRI may be done to determine the size, location, and number of fibroids. How is this treated? Treatment may include:  Watchful waiting. This involves getting the fibroid checked by your health care provider to see if it grows or shrinks. Follow your health care provider's recommendations for how often to have this checked.  Hormone medicines. These can be taken by mouth or given through an intrauterine device (IUD).  Surgery. ? Removing the fibroids (myomectomy) or the uterus (hysterectomy). ? Removing blood supply to the fibroids (uterine artery embolization).  If fibroids interfere with your fertility and you  want to become pregnant, your health care provider may recommend having the fibroids removed. Follow these instructions at home:  Keep all follow-up visits as directed by your health care provider. This is important.  Take over-the-counter and prescription medicines only as told by your health care provider. ? If you were prescribed a hormone treatment, take the hormone medicines exactly as directed.  Ask your health care provider about taking iron pills and increasing the amount of dark green, leafy vegetables in your diet. These actions can help to boost your blood iron levels, which may be affected by heavy menstrual bleeding.  Pay close attention to your period and tell your health care provider about any changes, such as: ? Increased blood flow that requires you to use more pads or tampons than usual per month. ? A change in the number of days that your period lasts per month. ? A change in symptoms that are associated with your period, such as abdominal cramping or back pain. Contact a health care provider if:  You have pelvic pain, back pain, or abdominal cramps that cannot be controlled with medicines.  You have an increase in bleeding between and during periods.  You soak tampons or pads in a half hour or less.  You feel lightheaded, extra tired, or weak. Get help right away if:  You faint.  You have a sudden increase in pelvic pain. This information is not intended to replace advice given to you by your health care provider. Make sure you discuss any questions you have with your health care provider. Document Released: 09/01/2000 Document Revised: 05/04/2016 Document Reviewed: 03/03/2014 [employer] Interactive Patient Education  2018 [employer]  

## 2018-06-14 ENCOUNTER — Encounter: Payer: Self-pay | Admitting: Nurse Practitioner

## 2018-06-14 ENCOUNTER — Ambulatory Visit: Payer: Medicare Other | Admitting: Nurse Practitioner

## 2018-06-14 VITALS — BP 109/81 | HR 85 | Resp 16 | Ht 63.0 in | Wt 219.2 lb

## 2018-06-14 DIAGNOSIS — K449 Diaphragmatic hernia without obstruction or gangrene: Secondary | ICD-10-CM | POA: Diagnosis not present

## 2018-06-14 DIAGNOSIS — M25552 Pain in left hip: Secondary | ICD-10-CM | POA: Diagnosis not present

## 2018-06-14 DIAGNOSIS — Z23 Encounter for immunization: Secondary | ICD-10-CM | POA: Diagnosis not present

## 2018-06-14 DIAGNOSIS — K219 Gastro-esophageal reflux disease without esophagitis: Secondary | ICD-10-CM

## 2018-06-14 LAB — POCT WET PREP (WET MOUNT): TRICHOMONAS WET PREP HPF POC: ABSENT

## 2018-06-14 NOTE — Progress Notes (Signed)
[facility] [place], [place] [address]  Internal MEDICINE  Office Visit Note  Patient Name: Shannon Foley  [id number]  [id number]  Date of Service: 06/23/2018  Chief Complaint  Patient presents with  . Medical Management of Chronic Issues    3 month follow up.     The patient is here for follow up visit. Today, she is c/o abdominal pain. Pain is present all the time. This pain is located in epigastric and periumbilical area. Has been assessed in the past with negative ultrasound and negative CT of abdomen and pelvis. She is also c/o chest pain. Describes this as shooting pain, present for past 2 weeks. Nothing she can pinpoint makes the pain better or worse. Denies shortness of breath. Denies congestion or cough. UGI was done 02/22/2018. This did show minimal hiatal hernia and mild GERD. She is not taking anything to help with acid reflux.  She is also c/o       Current Medication: Outpatient Encounter Medications as of 06/14/2018  Medication Sig  . ibuprofen (ADVIL,MOTRIN) 800 MG tablet Take 1 tablet (800 mg total) by mouth every 8 (eight) hours as needed for moderate pain.  . Norethindrone Acetate-Ethinyl Estrad-FE (JUNEL FE 24) 1-20 MG-MCG(24) tablet Take 1 tablet by mouth daily.  . [EXPIRED] terconazole (TERAZOL 3) 0.8 % vaginal cream Place 1 applicator vaginally at bedtime for 3 days. May apply externally twice daily prn irritation  . tinidazole (TINDAMAX) 500 MG tablet Take 2 tablets (1 gram ) daily x 5 days   No facility-administered encounter medications on file as of 06/14/2018.     Surgical History: Past Surgical History:  Procedure Laterality Date  . CYSTOSCOPY  05/10/2016   WNL  . ESOPHAGOGASTRODUODENOSCOPY (EGD) WITH PROPOFOL N/A 02/18/2016   Procedure: ESOPHAGOGASTRODUODENOSCOPY (EGD) WITH PROPOFOL;  Surgeon: Lucilla Lame, MD;  Location: [facility];  Service: Endoscopy;  Laterality: N/A;  . TONSILLECTOMY    . TONSILLECTOMY       Medical History: Past Medical History:  Diagnosis Date  . Back pain    mid and lower back  . Chronic abdominal pain   . Chronic constipation   . Fibroids    one submucosal and one intramural (both  < 1 inch in size)  . Headache    caused by pituitary cyst  . IBS (irritable bowel syndrome)   . Motion sickness cars  . Obesity   . Pituitary cyst (HCC)    Rathke's pouch cyst    Family History: Family History  Problem Relation Age of Onset  . Diabetes Mother   . Hypertension Mother   . Congestive Heart Failure Mother   . CVA Father        Had meningioma  . Diabetes Father   . Hypertension Father   . Bladder Cancer Paternal Aunt     Social History   Socioeconomic History  . Marital status: Single    Spouse name: Not on file  . Number of children: 0  . Years of education: 65  . Highest education level: Not on file  Occupational History  . Occupation: Chemical engineer  Social Needs  . Financial resource strain: Not on file  . Food insecurity:    Worry: Not on file    Inability: Not on file  . Transportation needs:    Medical: Not on file    Non-medical: Not on file  Tobacco Use  . Smoking status: Never Smoker  . Smokeless tobacco: Never Used  Substance and Sexual Activity  . Alcohol use: No  . Drug use: No  . Sexual activity: Yes    Birth control/protection: Pill  Lifestyle  . Physical activity:    Days per week: Not on file    Minutes per session: Not on file  . Stress: Not on file  Relationships  . Social connections:    Talks on phone: Not on file    Gets together: Not on file    Attends religious service: Not on file    Active member of club or organization: Not on file    Attends meetings of clubs or organizations: Not on file    Relationship status: Not on file  . Intimate partner violence:    Fear of current or ex partner: Not on file    Emotionally abused: Not on file    Physically abused: Not on file    Forced sexual activity: Not on  file  Other Topics Concern  . Not on file  Social History Narrative  . Not on file      Review of Systems  Constitutional: Positive for fatigue. Negative for activity change, appetite change and unexpected weight change.  HENT: Negative for congestion, ear pain, postnasal drip, rhinorrhea, sinus pain and sore throat.   Eyes: Negative.   Respiratory: Negative for cough, chest tightness, shortness of breath and wheezing.   Cardiovascular: Negative for chest pain and palpitations.  Gastrointestinal: Positive for abdominal pain and nausea. Negative for constipation, diarrhea and vomiting.       Tenderness in epigastric area. Increased gas and heartburn type symptoms.  Endocrine: Negative for cold intolerance, heat intolerance, polydipsia, polyphagia and polyuria.  Genitourinary: Negative for dysuria, flank pain, frequency, hematuria and menstrual problem.  Musculoskeletal: Positive for arthralgias and back pain.       Left hip tenderness. Hurts more when she is walking. Hurts more when she flexes at the hip joint to pull up her leg. Denies injury or trauma to the left hip.   Skin: Negative for rash.  Allergic/Immunologic: Positive for environmental allergies.  Neurological: Positive for headaches. Negative for dizziness and weakness.  Hematological: Negative for adenopathy.  Psychiatric/Behavioral: Negative for dysphoric mood and sleep disturbance. The patient is not nervous/anxious.     Today's Vitals   06/14/18 0856  BP: 109/81  Pulse: 85  Resp: 16  SpO2: 99%  Weight: 219 lb 3.2 oz (99.4 kg)  Height: 5\' 3"  (1.6 m)    Physical Exam  Constitutional: She is oriented to person, place, and time. She appears well-developed and well-nourished. No distress.  HENT:  Head: Normocephalic and atraumatic.  Nose: Nose normal.  Mouth/Throat: Oropharynx is clear and moist. No oropharyngeal exudate.  Eyes: Pupils are equal, round, and reactive to light. Conjunctivae and EOM are normal.   Neck: Normal range of motion. Neck supple. No JVD present. No tracheal deviation present. No thyromegaly present.  Cardiovascular: Normal rate, regular rhythm, normal heart sounds and intact distal pulses. Exam reveals no gallop and no friction rub.  No murmur heard. Borderline ECG today without acute ischemia or ST changes.   Pulmonary/Chest: Effort normal and breath sounds normal. No respiratory distress. She has no wheezes. She has no rales. She exhibits no tenderness.  Abdominal: Soft. Bowel sounds are normal. She exhibits no mass. There is tenderness in the epigastric area. No hernia.  Tenderness present along epigastric area of abdomen.  Musculoskeletal: Normal range of motion.  There is tenderness of the left hip which is worse  when she flexes at the hip joint. There is no bony or joint deformity or abnormality noted today.   Lymphadenopathy:    She has no cervical adenopathy.  Neurological: She is alert and oriented to person, place, and time. No cranial nerve deficit.  Skin: Skin is warm and dry. Capillary refill takes less than 2 seconds. She is not diaphoretic.  Psychiatric: She has a normal mood and affect. Her behavior is normal. Judgment and thought content normal.  Nursing note and vitals reviewed.   Assessment/Plan:  1. Gastroesophageal reflux disease without esophagitis Recommend use of OTC zantac or pepcid to reduce symptoms. Advised her to avoid trigger foods. Refer to GI for further evaluation and treatment.  - Ambulatory referral to Gastroenterology  2. Hiatal hernia Prior abdominal CT scan showing very mild hiatal hernia. Will refer to GI for further evaluation and treatment.  - Ambulatory referral to Gastroenterology  3. Left hip pain May take ibuprofen as needed and as indicated to reduce pain and inflammation. Will get x-ray of the left hip for further evaluation.  - DG HIP UNILAT WITH PELVIS 2-3 VIEWS LEFT; Future  4. Needs flu shot - Flu Vaccine MDCK QUAD  PF  General Counseling: Mirren verbalizes understanding of the findings of todays visit and agrees with plan of treatment. I have discussed any further diagnostic evaluation that may be needed or ordered today. We also reviewed her medications today. she has been encouraged to call the office with any questions or concerns that should arise related to todays visit.  The 'BRAT' diet is suggested, then progress to diet as tolerated as symptoms abate. Call if bloody stools, persistent diarrhea, vomiting, fever or abdominal pain.  This patient was seen by Leretha Pol FNP Collaboration with Dr [name] as a part of collaborative care agreement  Orders Placed This Encounter  Procedures  . DG HIP UNILAT WITH PELVIS 2-3 VIEWS LEFT  . Flu Vaccine MDCK QUAD PF  . Ambulatory referral to Gastroenterology      Time spent: [facility]

## 2018-06-17 ENCOUNTER — Ambulatory Visit
Admission: RE | Admit: 2018-06-17 | Discharge: 2018-06-17 | Disposition: A | Discharge status: Home / Self Care | Payer: Medicare Other | Source: Ambulatory Visit | Attending: Nurse Practitioner | Admitting: Nurse Practitioner

## 2018-06-17 DIAGNOSIS — M546 Pain in thoracic spine: Secondary | ICD-10-CM | POA: Insufficient documentation

## 2018-06-17 DIAGNOSIS — M25552 Pain in left hip: Secondary | ICD-10-CM | POA: Diagnosis not present

## 2018-06-17 DIAGNOSIS — G8929 Other chronic pain: Secondary | ICD-10-CM

## 2018-06-17 DIAGNOSIS — M549 Dorsalgia, unspecified: Secondary | ICD-10-CM | POA: Diagnosis not present

## 2018-06-17 LAB — CERVICOVAGINAL ANCILLARY ONLY
Chlamydia: NEGATIVE
Neisseria Gonorrhea: NEGATIVE

## 2018-06-23 DIAGNOSIS — Z23 Encounter for immunization: Secondary | ICD-10-CM | POA: Insufficient documentation

## 2018-06-23 DIAGNOSIS — M25552 Pain in left hip: Secondary | ICD-10-CM | POA: Insufficient documentation

## 2018-06-23 DIAGNOSIS — K449 Diaphragmatic hernia without obstruction or gangrene: Secondary | ICD-10-CM | POA: Insufficient documentation

## 2018-06-26 ENCOUNTER — Ambulatory Visit: Payer: Self-pay | Admitting: Obstetrics and Gynecology

## 2018-06-28 IMAGING — RF DG UGI W/ KUB
14 of 23 series · 14 of 23 positions shown · non-contrast
Comparison: CT abdomen and pelvis April 03, 2016

CLINICAL DATA: Abdominal pain and intermittent constipation

EXAM:
UPPER GI SERIES WITH KUB
TECHNIQUE: After obtaining a scout radiograph a routine upper GI series was
performed using thin barium.
FLUOROSCOPY TIME:  Fluoroscopy Time:  2 minutes 54 seconds
Radiation Exposure Index (if provided by the fluoroscopic device):
121.30 mGy
Number of Acquired Spot Images: 21

[Series 1: t abdomen supine · 0.15mm/px · 1 of 1 slices shown]
[im 1/1]
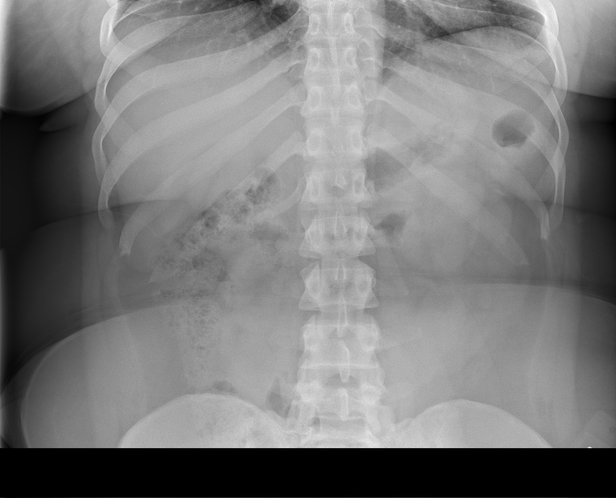

[Series 3: fluoro_barium 2fps_bw · 0.18mm/px · 1 of 1 slices shown (1 of 13)]
[im 1/1]
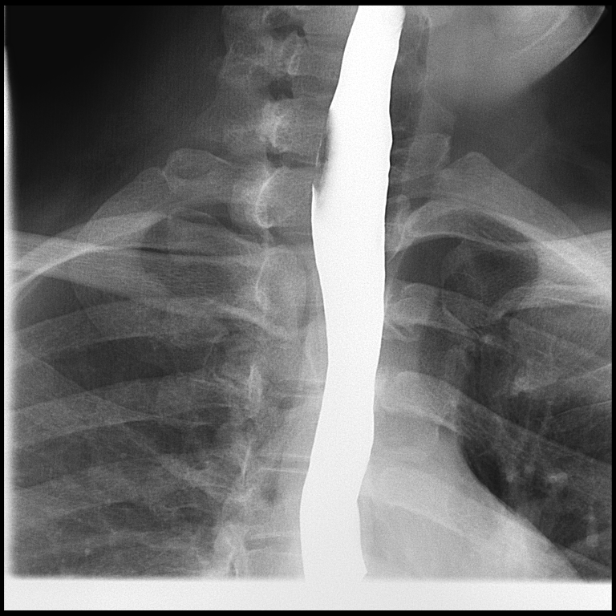

[Series 5: fluoro_barium 2fps_bw · 0.18mm/px · 1 of 1 slices shown (2 of 13)]
[im 1/1]
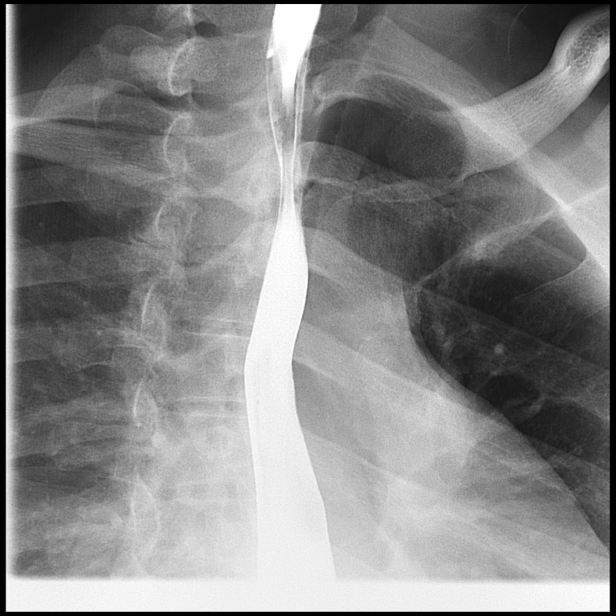

[Series 6: fluoro_barium 2fps_bw · 0.18mm/px · 1 of 1 slices shown (3 of 13)]
[im 1/1]
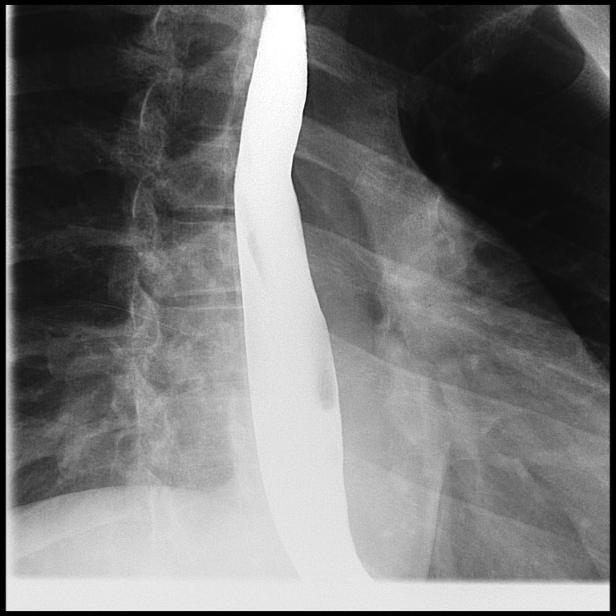

[Series 8: fluoro_barium 2fps_bw · 0.19mm/px · 1 of 1 slices shown (4 of 13)]
[im 1/1]
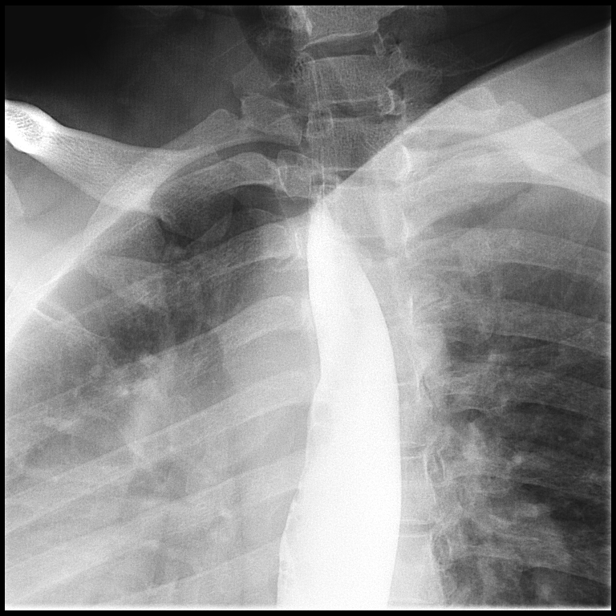

[Series 10: fluoro_barium 2fps_bw · 0.20mm/px · 1 of 1 slices shown (5 of 13)]
[im 1/1]
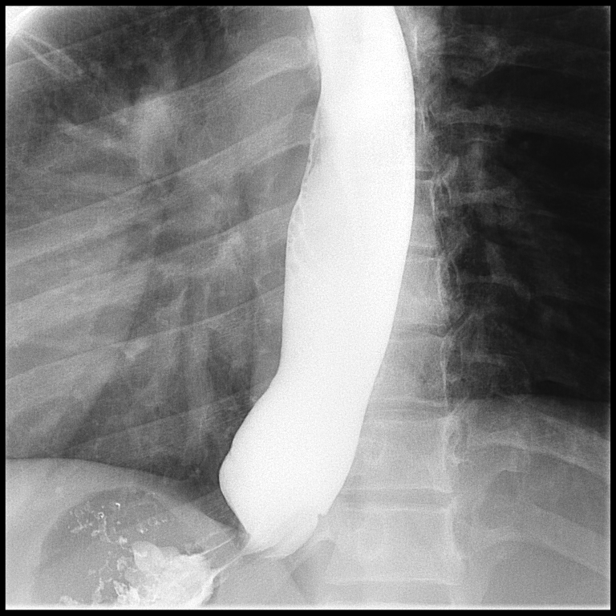

[Series 11: fluoro_barium 2fps_bw · 0.20mm/px · 1 of 1 slices shown (6 of 13)]
[im 1/1]
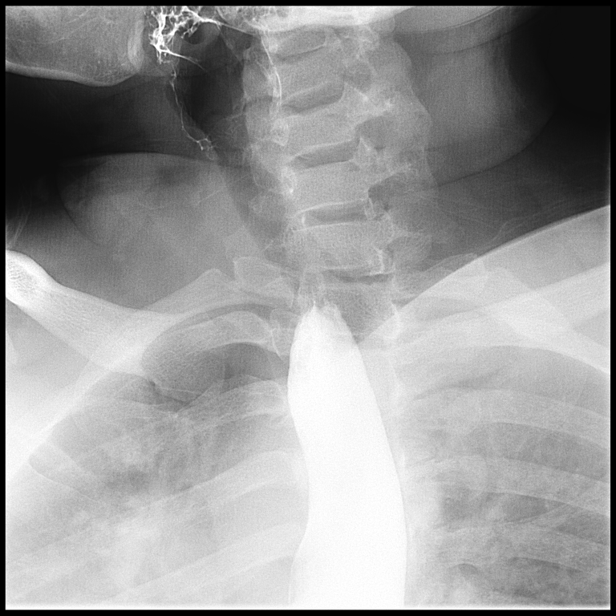

[Series 13: fluoro_barium 2fps_bw · 0.20mm/px · 1 of 1 slices shown (7 of 13)]
[im 1/1]
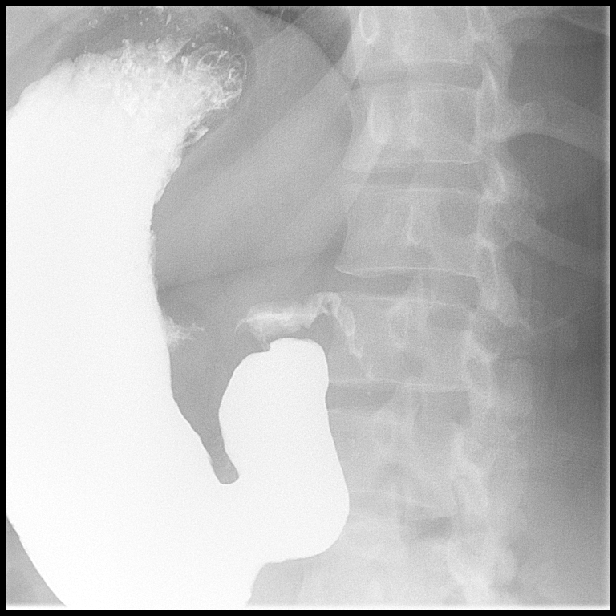

[Series 14: fluoro_barium 2fps_bw · 0.20mm/px · 1 of 1 slices shown (8 of 13)]
[im 1/1]
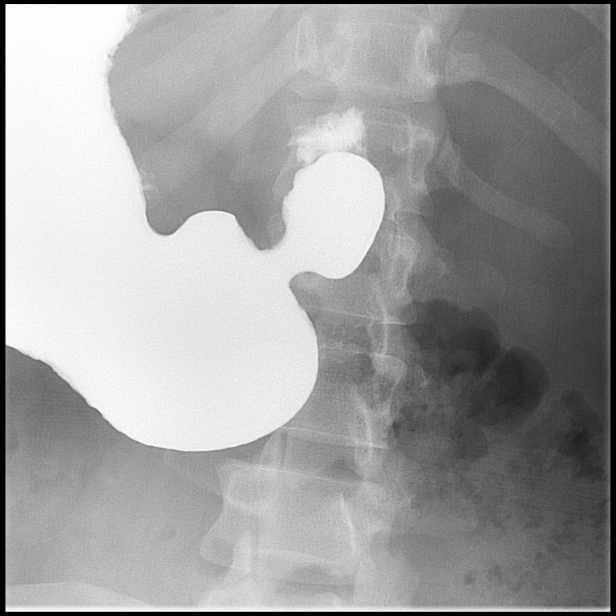

[Series 16: fluoro_barium 2fps_bw · 0.20mm/px · 1 of 1 slices shown (9 of 13)]
[im 1/1]
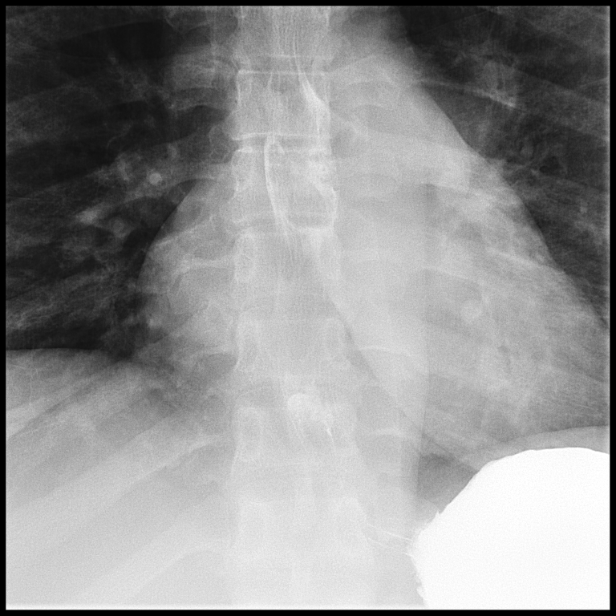

[Series 18: fluoro_barium 2fps_bw · 0.20mm/px · 1 of 1 slices shown (10 of 13)]
[im 1/1]
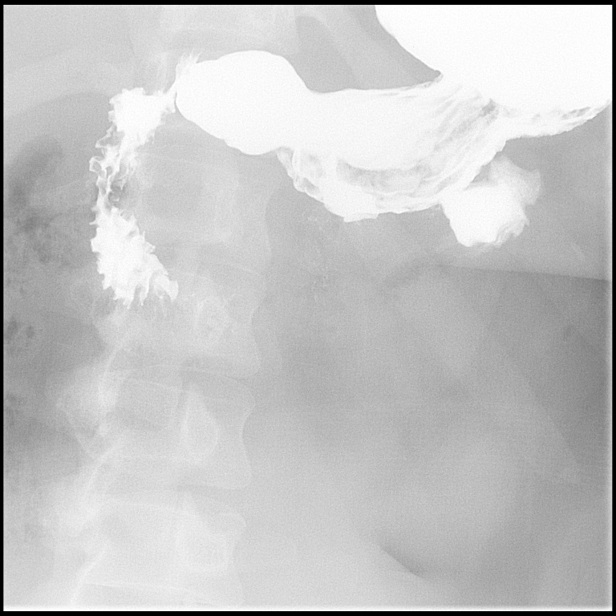

[Series 19: fluoro_barium 2fps_bw · 0.20mm/px · 1 of 1 slices shown (11 of 13)]
[im 1/1]
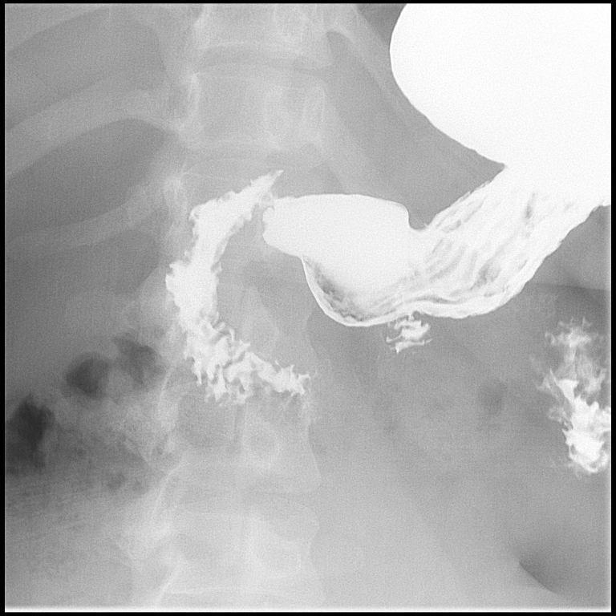

[Series 21: fluoro_barium 2fps_bw · 0.17mm/px · 1 of 1 slices shown (12 of 13)]
[im 1/1]
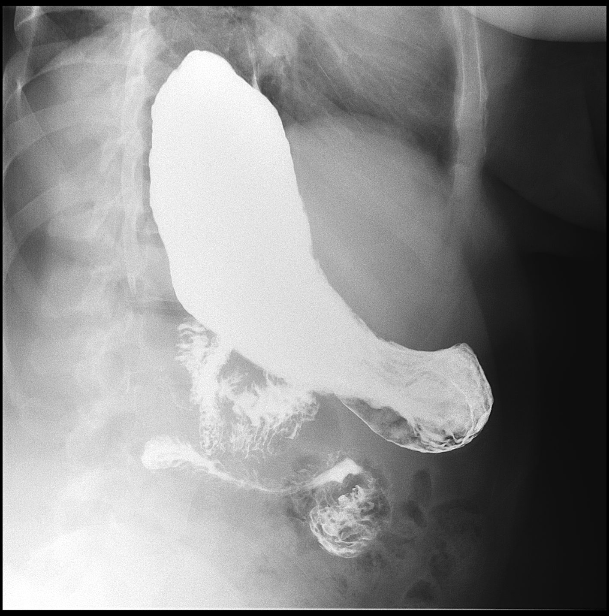

[Series 23: fluoro_barium 2fps_bw · 0.17mm/px · 1 of 1 slices shown (13 of 13)]
[im 1/1]
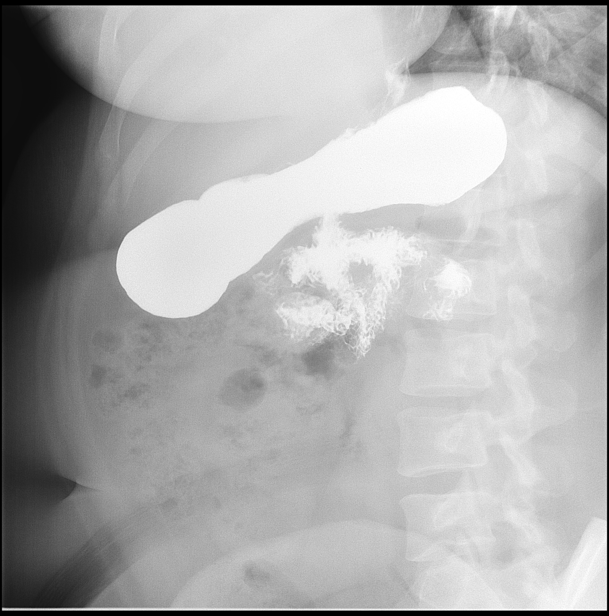

[14 of 23 positions shown; findings below may reference images not displayed]

FINDINGS: Preliminary abdomen image shows moderate stool in the colon. There
is no bowel dilatation or air-fluid level to suggest bowel
obstruction. No free air.

Esophagus, stomach, and duodenum are visualized. Swallowing and
peristalsis appear normal. There is a minimal hiatal hernia. There
is reflux of an incomplete column of barium to the mid esophagus
which empties rapidly. There is no esophageal stricture, mass, or
ulceration. Pharynx appears normal.

Gastric folds appear normal. No evident gastric mass or ulceration.
Duodenal folds appear normal. No duodenal mass or ulceration.
Proximal jejunum appears normal.
IMPRESSION: Minimal hiatal hernia with mild spontaneous gastroesophageal reflux.
Study otherwise unremarkable.

## 2018-08-06 ENCOUNTER — Ambulatory Visit: Payer: Medicare Other | Admitting: Gastroenterology

## 2018-08-06 ENCOUNTER — Encounter: Payer: Self-pay | Admitting: *Deleted

## 2018-10-09 ENCOUNTER — Other Ambulatory Visit: Payer: Self-pay | Admitting: Nurse Practitioner

## 2018-10-09 ENCOUNTER — Other Ambulatory Visit: Payer: Self-pay

## 2018-10-09 ENCOUNTER — Telehealth: Payer: Self-pay

## 2018-10-09 DIAGNOSIS — E559 Vitamin D deficiency, unspecified: Secondary | ICD-10-CM

## 2018-10-09 MED ORDER — ERGOCALCIFEROL 1.25 MG (50000 UT) PO CAPS: 50000.0000 [IU] | ORAL_CAPSULE | ORAL | 5 refills | Status: DC

## 2018-10-09 NOTE — Progress Notes (Signed)
Renewed drisdol 50000iu weekly due to persistent vitamin d deficiency

## 2018-10-10 NOTE — Telephone Encounter (Signed)
Error

## 2018-10-11 ENCOUNTER — Other Ambulatory Visit: Payer: Self-pay

## 2018-10-11 DIAGNOSIS — E559 Vitamin D deficiency, unspecified: Secondary | ICD-10-CM

## 2018-10-11 MED ORDER — ERGOCALCIFEROL 1.25 MG (50000 UT) PO CAPS: 50000.0000 [IU] | ORAL_CAPSULE | ORAL | 5 refills | Status: DC

## 2018-12-06 ENCOUNTER — Ambulatory Visit (INDEPENDENT_AMBULATORY_CARE_PROVIDER_SITE_OTHER): Payer: BLUE CROSS/BLUE SHIELD | Admitting: Nurse Practitioner

## 2018-12-06 ENCOUNTER — Other Ambulatory Visit: Payer: Self-pay

## 2018-12-06 ENCOUNTER — Encounter: Payer: Self-pay | Admitting: Nurse Practitioner

## 2018-12-06 VITALS — BP 146/85 | HR 86 | Temp 98.3°F | Resp 16 | Ht 63.0 in | Wt 220.6 lb

## 2018-12-06 DIAGNOSIS — R6889 Other general symptoms and signs: Secondary | ICD-10-CM | POA: Diagnosis not present

## 2018-12-06 DIAGNOSIS — J029 Acute pharyngitis, unspecified: Secondary | ICD-10-CM

## 2018-12-06 DIAGNOSIS — J069 Acute upper respiratory infection, unspecified: Secondary | ICD-10-CM | POA: Diagnosis not present

## 2018-12-06 DIAGNOSIS — R509 Fever, unspecified: Secondary | ICD-10-CM | POA: Diagnosis not present

## 2018-12-06 LAB — POCT INFLUENZA A/B
INFLUENZA A, POC: NEGATIVE
Influenza B, POC: NEGATIVE

## 2018-12-06 LAB — POCT RAPID STREP A (OFFICE): RAPID STREP A SCREEN: NEGATIVE

## 2018-12-06 MED ORDER — AZITHROMYCIN 250 MG PO TABS: ORAL_TABLET | ORAL | 0 refills | Status: DC

## 2018-12-06 NOTE — Progress Notes (Signed)
[facility] [place], [place] [address]  Internal MEDICINE  Office Visit Note  Patient Name: Shannon Foley  [id number]  [id number]  Date of Service: 12/08/2018  Chief Complaint  Patient presents with  . 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 Cough    some staff that came close contact with the patient that tested positive weretold to stay at home and pt went in to clean the next day and began feeling sick.  . Headache  . Sore Throat     Sore throat, fever, headache, and cough since Monday. Has had exposure to COVID19 through her work. 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 Her flu and strep screens are both negative today.   Pt is here for a sick visit.     Current Medication:  Outpatient Encounter Medications as of 12/06/2018  Medication Sig  . ergocalciferol (DRISDOL) 1.25 MG (50000 UT) capsule Take 1 capsule (50,000 Units total) by mouth once a week.  Marland Kitchen ibuprofen (ADVIL,MOTRIN) 800 MG tablet Take 1 tablet (800 mg total) by mouth every 8 (eight) hours as needed for moderate pain.  . Norethindrone Acetate-Ethinyl Estrad-FE (JUNEL FE 24) 1-20 MG-MCG(24) tablet Take 1 tablet by mouth daily.  Marland Kitchen tinidazole (TINDAMAX) 500 MG tablet Take 2 tablets (1 gram ) daily x 5 days  . azithromycin (ZITHROMAX) 250 MG tablet z-pack - take as directed for 5 days   No facility-administered encounter medications on file as of 12/06/2018.       Medical History: Past Medical History:  Diagnosis Date  . Back pain    mid and lower back  . Chronic abdominal pain   . Chronic constipation   . Fibroids    one submucosal and one intramural (both  < 1 inch in size)  . Headache    caused  by pituitary cyst  . IBS (irritable bowel syndrome)   . Motion sickness cars  . Obesity   . Pituitary cyst ([facility])    Rathke's pouch cyst     Today's Vitals   12/06/18 1435  BP: (!) 146/85  Pulse: 86  Resp: 16  Temp: 98.3 F (36.8 C)  SpO2: 98%  Weight: 220 lb 9.6 oz (100.1 kg)  Height: 5\' 3"  (1.6 m)   Body mass index is 39.08 kg/m.  Review of Systems  Constitutional: Positive for chills and fatigue. Negative for fever and unexpected weight change.  HENT: Positive for congestion, postnasal drip, rhinorrhea and sore throat. Negative for sneezing.   Respiratory: Positive for cough. Negative for chest tightness, shortness of breath and wheezing.   Cardiovascular: Negative for chest pain and palpitations.  Gastrointestinal: Negative for abdominal pain, constipation, diarrhea, nausea and vomiting.  Genitourinary: Negative for dysuria.  Musculoskeletal: Positive for myalgias. Negative for arthralgias, back pain, joint swelling and neck pain.  Skin: Negative for rash.  Allergic/Immunologic: Positive for environmental allergies.  Neurological: Positive for headaches. Negative for tremors and numbness.  Hematological: Positive for adenopathy. Does not bruise/bleed easily.  Psychiatric/Behavioral: Negative for behavioral problems (Depression), sleep disturbance and suicidal ideas. The patient is not nervous/anxious.     Physical Exam Constitutional:      General: She is not in acute distress.  Appearance: She is well-developed. She is not diaphoretic.  HENT:     Head: Normocephalic and atraumatic.     Right Ear: Tympanic membrane is bulging.     Left Ear: Tympanic membrane is bulging.     Nose: Congestion present.     Mouth/Throat:     Pharynx: Posterior oropharyngeal erythema present. No oropharyngeal exudate.  Eyes:     Pupils: Pupils are equal, round, and reactive to light.  Neck:     Musculoskeletal: Normal range of motion and neck supple.     Thyroid: No thyromegaly.      Vascular: No JVD.     Trachea: No tracheal deviation.  Cardiovascular:     Rate and Rhythm: Normal rate and regular rhythm.     Heart sounds: Normal heart sounds. No murmur. No friction rub. No gallop.   Pulmonary:     Effort: Pulmonary effort is normal. No respiratory distress.     Breath sounds: Normal breath sounds. No wheezing or rales.     Comments: Congested, non-productive cough present  Chest:     Chest wall: No tenderness.  Abdominal:     General: Bowel sounds are normal.     Palpations: Abdomen is soft.     Tenderness: There is no abdominal tenderness.  Musculoskeletal: Normal range of motion.  Lymphadenopathy:     Cervical: Cervical adenopathy present.  Skin:    General: Skin is warm and dry.  Neurological:     Mental Status: She is alert and oriented to person, place, and time.     Cranial Nerves: No cranial nerve deficit.  Psychiatric:        Behavior: Behavior normal.        Thought Content: Thought content normal.        Judgment: Judgment normal.   Assessment/Plan: 1. Acute upper respiratory infection Start z=pack. Take as directed for 5 days. Rest and increase fluids. Take OTC medication as needed and as indicated to improve symptoms.  - azithromycin (ZITHROMAX) 250 MG tablet; z-pack - take as directed for 5 days  Dispense: 6 tablet; Refill: 0  2. Fever and chills Negative flu and strep screens today. - POCT Influenza A/B - POCT rapid strep A  3. Sore throat Negative flu and strep screens today. - POCT rapid strep A  General Counseling: Shannon Foley verbalizes understanding of the findings of todays visit and agrees with plan of treatment. I have discussed any further diagnostic evaluation that may be needed or ordered today. We also reviewed her medications today. she has been encouraged to call the office with any questions or concerns that should arise related to todays visit.    Counseling:  Rest and increase fluids. Continue using OTC medication  to control symptoms.   This patient was seen by Leretha Pol FNP Collaboration with Dr [name] as a part of collaborative care agreement  Orders Placed This Encounter  Procedures  . POCT Influenza A/B  . POCT rapid strep A    Meds ordered this encounter  Medications  . azithromycin (ZITHROMAX) 250 MG tablet    Sig: z-pack - take as directed for 5 days    Dispense:  6 tablet    Refill:  0    Order Specific Question:   Supervising Provider    Answer:   [name] [[id number]]    Time spent: 25 Minutes

## 2018-12-08 DIAGNOSIS — J069 Acute upper respiratory infection, unspecified: Secondary | ICD-10-CM | POA: Insufficient documentation

## 2018-12-08 DIAGNOSIS — J029 Acute pharyngitis, unspecified: Secondary | ICD-10-CM | POA: Insufficient documentation

## 2018-12-08 DIAGNOSIS — R509 Fever, unspecified: Secondary | ICD-10-CM | POA: Insufficient documentation

## 2018-12-12 ENCOUNTER — Other Ambulatory Visit: Payer: Self-pay

## 2018-12-12 ENCOUNTER — Ambulatory Visit: Payer: Self-pay | Admitting: Nurse Practitioner

## 2018-12-12 ENCOUNTER — Encounter: Payer: Self-pay | Admitting: Nurse Practitioner

## 2018-12-12 ENCOUNTER — Ambulatory Visit: Payer: BLUE CROSS/BLUE SHIELD | Admitting: Internal Medicine

## 2018-12-12 DIAGNOSIS — L03011 Cellulitis of right finger: Secondary | ICD-10-CM | POA: Diagnosis not present

## 2018-12-12 DIAGNOSIS — Z5329 Procedure and treatment not carried out because of patient's decision for other reasons: Secondary | ICD-10-CM

## 2018-12-12 DIAGNOSIS — Z91199 Patient's noncompliance with other medical treatment and regimen due to unspecified reason: Secondary | ICD-10-CM

## 2018-12-12 MED ORDER — CEPHALEXIN 500 MG PO CAPS: 500.0000 mg | ORAL_CAPSULE | Freq: Three times a day (TID) | ORAL | 0 refills | Status: AC

## 2018-12-12 NOTE — Progress Notes (Signed)
Spoke pt on telephone her right thumb is infected ,right and numbness as per heather we going to send keflex 500 mg tid a day for 10 days

## 2018-12-12 NOTE — Progress Notes (Signed)
Patient had a non face to face evaluation

## 2018-12-13 DIAGNOSIS — E236 Other disorders of pituitary gland: Secondary | ICD-10-CM | POA: Diagnosis not present

## 2018-12-13 DIAGNOSIS — E237 Disorder of pituitary gland, unspecified: Secondary | ICD-10-CM | POA: Diagnosis not present

## 2019-01-06 ENCOUNTER — Ambulatory Visit: Payer: BLUE CROSS/BLUE SHIELD | Admitting: Nurse Practitioner

## 2019-01-06 ENCOUNTER — Other Ambulatory Visit: Payer: Self-pay

## 2019-01-06 ENCOUNTER — Other Ambulatory Visit: Payer: Self-pay | Admitting: Nurse Practitioner

## 2019-01-06 ENCOUNTER — Encounter: Payer: Self-pay | Admitting: Nurse Practitioner

## 2019-01-06 VITALS — BP 134/91 | HR 78 | Temp 98.3°F | Resp 16 | Ht 63.0 in | Wt 215.0 lb

## 2019-01-06 DIAGNOSIS — H6690 Otitis media, unspecified, unspecified ear: Secondary | ICD-10-CM | POA: Diagnosis not present

## 2019-01-06 MED ORDER — NEOMYCIN-POLYMYXIN-HC 3.5-10000-1 OT SOLN: 4.0000 [drp] | Freq: Four times a day (QID) | OTIC | 1 refills | Status: DC

## 2019-01-06 MED ORDER — CIPROFLOXACIN-DEXAMETHASONE 0.3-0.1 % OT SUSP: 4.0000 [drp] | Freq: Two times a day (BID) | OTIC | 0 refills | Status: DC

## 2019-01-06 NOTE — Progress Notes (Signed)
[facility] [place], [place] [address]  Internal MEDICINE  Telephone Visit  Patient Name: Shannon Foley  [id number]  [id number]  Date of Service: 01/06/2019  I connected with the patient at 1:41pm by webcam and verified the patients identity using two identifiers.   I discussed the limitations, risks, security and privacy concerns of performing an evaluation and management service by webcam and the availability of in person appointments. I also discussed with the patient that there may be a patient responsible charge related to the service.  The patient expressed understanding and agrees to proceed.    Chief Complaint  Patient presents with  . Telephone Screen  . Telephone Assessment  . Ear Pain    left  . Headache    The patient has been contacted via webcam for follow up visit due to concerns for spread of novel coronavirus. The patient is complaining of left ear pain. Feels like there is drainage, but has not seen any. Has been going on for last few weeks. Does have congestion and mild headache. She denies fever. She denies sore throat. She denies nausea or vomiting. Has been taking tylenol for this, but this doesn't seem to help. She did see Dr. Tami Ribas in the past for eczema in the ear. He gave her mometasone ear drops, which she has been using. These have not helped either.      Current Medication: Outpatient Encounter Medications as of 01/06/2019  Medication Sig  . ergocalciferol (DRISDOL) 1.25 MG (50000 UT) capsule Take 1 capsule (50,000 Units total) by mouth once a week.  Marland Kitchen ibuprofen (ADVIL,MOTRIN) 800 MG tablet Take 1 tablet (800 mg total) by mouth every 8 (eight) hours as needed for moderate pain.  . Norethindrone Acetate-Ethinyl Estrad-FE (JUNEL FE 24) 1-20 MG-MCG(24) tablet Take 1 tablet by mouth daily.  Marland Kitchen tinidazole (TINDAMAX) 500 MG tablet Take 2 tablets (1 gram ) daily x 5 days  . ciprofloxacin-dexamethasone (CIPRODEX) OTIC suspension  Place 4 drops into the left ear 2 (two) times daily.  . [DISCONTINUED] azithromycin (ZITHROMAX) 250 MG tablet z-pack - take as directed for 5 days (Patient not taking: Reported on 01/06/2019)   No facility-administered encounter medications on file as of 01/06/2019.     Surgical History: Past Surgical History:  Procedure Laterality Date  . CYSTOSCOPY  05/10/2016   WNL  . ESOPHAGOGASTRODUODENOSCOPY (EGD) WITH PROPOFOL N/A 02/18/2016   Procedure: ESOPHAGOGASTRODUODENOSCOPY (EGD) WITH PROPOFOL;  Surgeon: Lucilla Lame, MD;  Location: [facility];  Service: Endoscopy;  Laterality: N/A;  . TONSILLECTOMY    . TONSILLECTOMY      Medical History: Past Medical History:  Diagnosis Date  . Back pain    mid and lower back  . Chronic abdominal pain   . Chronic constipation   . Fibroids    one submucosal and one intramural (both  < 1 inch in size)  . Headache    caused by pituitary cyst  . IBS (irritable bowel syndrome)   . Motion sickness cars  . Obesity   . Pituitary cyst ([facility])    Rathke's pouch cyst    Family History: Family History  Problem Relation Age of Onset  . Diabetes Mother   . Hypertension Mother   . Congestive Heart Failure Mother   . CVA Father        Had meningioma  . Diabetes Father   . Hypertension Father   . Bladder Cancer Paternal Aunt     Social History  Socioeconomic History  . Marital status: Single    Spouse name: Not on file  . Number of children: 0  . Years of education: 66  . Highest education level: Not on file  Occupational History  . Occupation: Chemical engineer  Social Needs  . Financial resource strain: Not on file  . Food insecurity:    Worry: Not on file    Inability: Not on file  . Transportation needs:    Medical: Not on file    Non-medical: Not on file  Tobacco Use  . Smoking status: Never Smoker  . Smokeless tobacco: Never Used  Substance and Sexual Activity  . Alcohol use: No  . Drug use: No  . Sexual activity: Yes     Birth control/protection: Pill  Lifestyle  . Physical activity:    Days per week: Not on file    Minutes per session: Not on file  . Stress: Not on file  Relationships  . Social connections:    Talks on phone: Not on file    Gets together: Not on file    Attends religious service: Not on file    Active member of club or organization: Not on file    Attends meetings of clubs or organizations: Not on file    Relationship status: Not on file  . Intimate partner violence:    Fear of current or ex partner: Not on file    Emotionally abused: Not on file    Physically abused: Not on file    Forced sexual activity: Not on file  Other Topics Concern  . Not on file  Social History Narrative  . Not on file      Review of Systems  Constitutional: Negative for chills, fatigue, fever and unexpected weight change.  HENT: Positive for congestion, ear pain and postnasal drip. Negative for rhinorrhea, sneezing and sore throat.   Respiratory: Negative for cough, chest tightness and shortness of breath.   Cardiovascular: Negative for chest pain and palpitations.  Gastrointestinal: Negative for abdominal pain, constipation, diarrhea, nausea and vomiting.  Genitourinary: Negative for dysuria and frequency.  Musculoskeletal: Negative for arthralgias, back pain, joint swelling and neck pain.  Skin: Negative for rash.  Allergic/Immunologic: Positive for environmental allergies.  Neurological: Positive for headaches. Negative for tremors and numbness.  Hematological: Negative for adenopathy. Does not bruise/bleed easily.  Psychiatric/Behavioral: Negative for behavioral problems (Depression), sleep disturbance and suicidal ideas. The patient is not nervous/anxious.     Today's Vitals   01/06/19 1332  BP: (!) 134/91  Pulse: 78  Resp: 16  Temp: 98.3 F (36.8 C)  Weight: 215 lb (97.5 kg)  Height: 5\' 3"  (1.6 m)   Body mass index is 38.09 kg/m.  Observation/Objective:  The patient is alert,  oriented, and pleasant. She does have some nasal congestion without acute distress at this time.    Assessment/Plan:  1. Acute otitis media, unspecified otitis media type Start ciprodex ear drops. Use four drops in the left ear twice daily. norify the office if no improvement or worsening of symptom in next 2 weeks. May need new referral to ENT.  - ciprofloxacin-dexamethasone (CIPRODEX) OTIC suspension; Place 4 drops into the left ear 2 (two) times daily.  Dispense: 7.5 mL; Refill: 0   General Counseling: Cali verbalizes understanding of the findings of today's phone visit and agrees with plan of treatment. I have discussed any further diagnostic evaluation that may be needed or ordered today. We also reviewed her medications today. she has  been encouraged to call the office with any questions or concerns that should arise related to todays visit.  This patient was seen by [place] with Dr [name] as a part of collaborative care agreement  Meds ordered this encounter  Medications  . ciprofloxacin-dexamethasone (CIPRODEX) OTIC suspension    Sig: Place 4 drops into the left ear 2 (two) times daily.    Dispense:  7.5 mL    Refill:  0    Order Specific Question:   Supervising Provider    Answer:   [name] [[id number]]    Time spent: 85 Minutes    Dr [name] Internal medicine

## 2019-02-13 ENCOUNTER — Other Ambulatory Visit: Payer: Self-pay

## 2019-02-13 ENCOUNTER — Encounter: Payer: Self-pay | Admitting: Certified Nurse Midwife

## 2019-02-13 ENCOUNTER — Ambulatory Visit (INDEPENDENT_AMBULATORY_CARE_PROVIDER_SITE_OTHER): Payer: BLUE CROSS/BLUE SHIELD | Admitting: Certified Nurse Midwife

## 2019-02-13 VITALS — BP 130/80 | Ht 63.0 in | Wt 219.6 lb

## 2019-02-13 DIAGNOSIS — R87612 Low grade squamous intraepithelial lesion on cytologic smear of cervix (LGSIL): Secondary | ICD-10-CM

## 2019-02-13 DIAGNOSIS — N870 Mild cervical dysplasia: Secondary | ICD-10-CM

## 2019-02-13 DIAGNOSIS — D250 Submucous leiomyoma of uterus: Secondary | ICD-10-CM

## 2019-02-13 DIAGNOSIS — N912 Amenorrhea, unspecified: Secondary | ICD-10-CM | POA: Diagnosis not present

## 2019-02-13 DIAGNOSIS — D251 Intramural leiomyoma of uterus: Secondary | ICD-10-CM

## 2019-02-13 HISTORY — DX: Mild cervical dysplasia: N87.0

## 2019-02-13 LAB — POCT URINE PREGNANCY: Preg Test, Ur: NEGATIVE

## 2019-02-13 NOTE — Progress Notes (Addendum)
History of Present Illness:  Shannon Foley is a 33 y.o. who was started on  Current Outpatient Medications on File Prior to Visit  Medication Sig Dispense Refill               Norethindrone Acetate-Ethinyl Estrad-FE (JUNEL FE 24) 1-20 MG-MCG(24) tablet Take 1 tablet by mouth daily. 1 Package 11      approximately 7 months ago for both contraception and for heavier menses. Has a history of small fibroids first noted in 2017. Repeat ultrasound in Sept 2019 did show that the fibroids were larger and more numerous than on her 2017 scan: Within the uterus are multiple suspected fibroids measuring: Fibroid 1: submucosal, on the Left posterior uterine wall = 33.58 x 31.93 mm Fibroid 2: Intramural vs subserosal, on the fudal Left uterine wall = 30.58 x 26.43 mm Fibroid 3: intramural vs Subserosal, on the fundal uterine wall = 6.81 x 5.92 mm She reports that initially after starting the oral contraceptives last fall, her menses were still 5 days long and with cramping, but she was no longer needing to change her tampon every hour. Her last normal menses was 8 April. She had only a little spotting for a menses May 8. She was afraid that she was pregnant, but had a negative home pregnancy test at that time.  She has a history of LGSIL Pap with positive HRHPV in 2018 and 2019. Had a colposcopy in 2018 that was c/w CIN1. Has not had a colposcopy since her 05/27/2018 PAP PMHx: She  has a past medical history of Back pain, Chronic abdominal pain, Chronic constipation, Fibroids, Headache, IBS (irritable bowel syndrome), Motion sickness (cars), Obesity, and Pituitary cyst ([place]). Also,  has a past surgical history that includes Tonsillectomy; Esophagogastroduodenoscopy (egd) with propofol (N/A, 02/18/2016); Cystoscopy (05/10/2016); and Tonsillectomy., family history includes Bladder Cancer in her paternal aunt; CVA in her father; Congestive Heart Failure in her mother; Diabetes in her father and mother;  Hypertension in her father and mother.,  reports that she has never smoked. She has never used smokeless tobacco. She reports that she does not drink alcohol or use drugs.  She has a current medication list which includes the following prescription(s): ibuprofen, neomycin-polymyxin-hydrocortisone, and norethindrone acetate-ethinyl estrad-fe. Also, has No Known Allergies.  ROS  Physical Exam:  BP 130/80    Ht 5\' 3"  (1.6 m)    Wt 219 lb 9.6 oz (99.6 kg)    LMP 12/25/2018 (Exact Date)    BMI 38.90 kg/m  Body mass index is 38.9 kg/m. Constitutional: Well nourished, well developed female in no acute distress.  Abdomen: diffusely non tender to palpation, non distended, and no masses, hernias Pelvic exam: External/BUS: no lesions, inflammation  Vagina: no masses, NT  Cervix: closed, posterior  Uterus: AV, globular fundus (10wksize?), difficult to outline due to body habitus  Adnexa: no masses, NT Neuro: Grossly intact Psych:  Normal mood and affect.    Results for orders placed or performed in visit on 02/13/19 (from the past 24 hour(s))  POCT urine pregnancy     Status: Normal   Collection Time: 02/13/19  3:31 PM  Result Value Ref Range   Preg Test, Ur Negative Negative   Assessment: Oral contraceptives effective in reducing heavy menstrual flow. Reassured patient that she is not pregnant and that light menses or lack of withdrawal bleeding is very common on oral contraceptives.  Uterine fibroids? Whether uterus feels larger LGSIL Pap with + HRHPV  Plan: She will undergo  no change in her OCPs Pelvic ultrasound to follow growth of uterine fibroids Colposcopy with Dr Glennon Mac and follow up with Dr Glennon Mac regarding ultrasound findings  She was amenable to this plan  Dalia Heading, [facility], [facility] 02/13/2019  4:46 PM

## 2019-02-17 DIAGNOSIS — H60543 Acute eczematoid otitis externa, bilateral: Secondary | ICD-10-CM | POA: Diagnosis not present

## 2019-03-04 ENCOUNTER — Encounter: Payer: Self-pay | Admitting: Obstetrics and Gynecology

## 2019-03-04 ENCOUNTER — Other Ambulatory Visit (HOSPITAL_COMMUNITY)
Admission: RE | Admit: 2019-03-04 | Discharge: 2019-03-04 | Disposition: A | Discharge status: Home / Self Care | Payer: BC Managed Care – PPO | Source: Ambulatory Visit | Attending: Obstetrics and Gynecology | Admitting: Obstetrics and Gynecology

## 2019-03-04 ENCOUNTER — Other Ambulatory Visit: Payer: Self-pay

## 2019-03-04 ENCOUNTER — Ambulatory Visit (INDEPENDENT_AMBULATORY_CARE_PROVIDER_SITE_OTHER): Payer: BC Managed Care – PPO | Admitting: Obstetrics and Gynecology

## 2019-03-04 ENCOUNTER — Ambulatory Visit (INDEPENDENT_AMBULATORY_CARE_PROVIDER_SITE_OTHER): Payer: BC Managed Care – PPO

## 2019-03-04 VITALS — BP 122/78 | Ht 66.0 in | Wt 222.0 lb

## 2019-03-04 DIAGNOSIS — D250 Submucous leiomyoma of uterus: Secondary | ICD-10-CM | POA: Diagnosis not present

## 2019-03-04 DIAGNOSIS — R87612 Low grade squamous intraepithelial lesion on cytologic smear of cervix (LGSIL): Secondary | ICD-10-CM | POA: Insufficient documentation

## 2019-03-04 DIAGNOSIS — D252 Subserosal leiomyoma of uterus: Secondary | ICD-10-CM

## 2019-03-04 DIAGNOSIS — N870 Mild cervical dysplasia: Secondary | ICD-10-CM

## 2019-03-04 NOTE — Progress Notes (Signed)
HPI:  Shannon Foley is a 33 y.o.  G0P0000  who presents today for evaluation and management of abnormal cervical cytology.    Dysplasia History:  LGSIL pap smear (history of abnormal pap smears, last colpo 2 years ago, CIN 1)  Patient also had ultrasound today that showed 4 small fibroids. See report below. Patient has no complaints regarding her fibroids.   OB History  Gravida Para Term Preterm AB Living  0 0 0 0 0 0  SAB TAB Ectopic Multiple Live Births  0 0 0 0 0    Past Medical History:  Diagnosis Date  . Back pain    mid and lower back  . Chronic abdominal pain   . Chronic constipation   . Fibroids    one submucosal and one intramural (both  < 1 inch in size)  . Headache    caused by pituitary cyst  . IBS (irritable bowel syndrome)   . Motion sickness cars  . Obesity   . Pituitary cyst ([facility])    Rathke's pouch cyst    Past Surgical History:  Procedure Laterality Date  . CYSTOSCOPY  05/10/2016   WNL  . ESOPHAGOGASTRODUODENOSCOPY (EGD) WITH PROPOFOL N/A 02/18/2016   Procedure: ESOPHAGOGASTRODUODENOSCOPY (EGD) WITH PROPOFOL;  Surgeon: Lucilla Lame, MD;  Location: [facility];  Service: Endoscopy;  Laterality: N/A;  . TONSILLECTOMY    . TONSILLECTOMY      SOCIAL HISTORY: Social History   Substance and Sexual Activity  Alcohol Use No   Social History   Substance and Sexual Activity  Drug Use No     Family History  Problem Relation Age of Onset  . Diabetes Mother   . Hypertension Mother   . Congestive Heart Failure Mother   . CVA Father        Had meningioma  . Diabetes Father   . Hypertension Father   . Bladder Cancer Paternal Aunt     ALLERGIES:  Patient has no known allergies.  Current Outpatient Medications on File Prior to Visit  Medication Sig Dispense Refill  . ibuprofen (ADVIL,MOTRIN) 800 MG tablet Take 1 tablet (800 mg total) by mouth every 8 (eight) hours as needed for moderate pain. 15 tablet 0  .  neomycin-polymyxin-hydrocortisone (CORTISPORIN) OTIC solution Place 4 drops into both ears 4 (four) times daily. 10 mL 1  . Norethindrone Acetate-Ethinyl Estrad-FE (JUNEL FE 24) 1-20 MG-MCG(24) tablet Take 1 tablet by mouth daily. 1 Package 11   No current facility-administered medications on file prior to visit.     Physical Exam: -Vitals:  BP 122/78   Ht 5\' 6"  (1.676 m)   Wt 222 lb (100.7 kg)   LMP 03/02/2019   BMI 35.83 kg/m  GEN: WD, WN, NAD.  A+ O x 3, good mood and affect. ABD:  NT, ND.  Soft, no masses.  No hernias noted.   Pelvic:   Vulva: Normal appearance.  No lesions.  Vagina: No lesions or abnormalities noted.  Support: Normal pelvic support.  Urethra No masses tenderness or scarring.  Meatus Normal size without lesions or prolapse.  Cervix: See below.  Anus: Normal exam.  No lesions.  Perineum: Normal exam.  No lesions.        Bimanual   Uterus: Normal size.  Non-tender.  Mobile.  AV.  Adnexae: No masses.  Non-tender to palpation.  Cul-de-sac: Negative for abnormality.   PROCEDURE: 1.  Urine Pregnancy Test:  negative 2.  Colposcopy performed with 4% acetic acid after  verbal consent obtained                                         -Aceto-white Lesions Location(s): diffusely around the cervix.              -Biopsy performed at 1, 5, and 8 o'clock               -ECC indicated and performed: Yes.       -Biopsy sites made hemostatic with pressure, AgNO3, and/or Monsel's solution   -Satisfactory colposcopy: No.    -Evidence of Invasive cervical CA :  NO  Imaging Results US Pelvis Transvanginal Non-ob (tv Only)  Result Date: 03/04/2019 Patient Name: Shannon Foley DOB: 05-05-1986 MRN: [id number] ULTRASOUND REPORT Location: [facility] Date of Service: 03/04/2019 Indications: Follow up fibroids Findings: The uterus is anteverted and measures 9.4 x 3.9 x 6.2cm. Echo texture is homogenous with evidence of focal masses. Within the uterus are multiple suspected  fibroids measuring: Fibroid 1: 0.9 x 0.8 x 0.9cm (midline/high, subserosal)  NEW Fibroid 2: 2.6 x 2.7 x 3.0cm (left/midline, submucosal) Fibroid 3: 2.8 x 2.5 x 2.7cm (left/high, subserosal) Fibroid 4: 1.1 x 1.0 x 1.1cm (midline/high, subserosal)  The Endometrium measures 3.7 mm. Right Ovary measures 3.6 x 1.8 x 1.4 cm. It is normal in appearance. Left Ovary measures 3.9 x 2.3 x 1.5 cm. It is normal in appearance. Survey of the adnexa demonstrates no adnexal masses. There is no free fluid in the cul de sac. Impression: 1. Multi-fibroid uterus (appear unchanged from prior, with one new one), otherwise normal gyn ultrasound. Vita Barley, RT The ultrasound images and findings were reviewed by me and I agree with the above report. Prentice Docker, MD, [facility], [facility] 03/04/2019 2:36 PM       ASSESSMENT:  Shannon Foley is a 33 y.o. G0P0000 here for  1. LGSIL on Pap smear of cervix   .  PLAN: 1.  I discussed the grading system of pap smears and HPV high risk viral types.  We will discuss and base management after colpo results return.     Prentice Docker, MD  [facility], [facility] 03/04/2019  3:05 PM

## 2019-03-04 NOTE — Patient Instructions (Signed)
Colposcopy Colposcopy is a procedure to examine the lowest part of the uterus (cervix), the vagina, and the area around the vaginal opening (vulva) for abnormalities or signs of disease. The procedure is done using a lighted microscope or magnifying lens (colposcope). If any unusual cells are found during the procedure, your health care provider may remove a tissue sample for testing (biopsy). A colposcopy may be done if you:  Have an abnormal Pap test. A Pap test is a screening test that is used to check for signs of cancer or infection of the vagina, cervix, and uterus.  Have a Pap smear test in which you test positive for high-risk HPV (human papillomavirus).  Have a sore or lesion on your cervix.  Have genital warts on your vulva, vagina, or cervix.  Took certain medicines while pregnant, such as diethylstilbestrol (DES).  Have pain during sexual intercourse.  Have vaginal bleeding, especially after sexual intercourse.  Need to have a cervical polyp removed.  Need to have a lost intrauterine device (IUD) string located. Let your health care provider know about:  Any allergies you have, including allergies to prescribed medicine, latex, or iodine.  All medicines you are taking, including vitamins, herbs, eye drops, creams, and over-the-counter medicines. Bring a list of all of your medicines to your appointment.  Any problems you or family members have had with anesthetic medicines.  Any blood disorders you have.  Any surgeries you have had.  Any medical conditions you have, such as pelvic inflammatory disease (PID) or endometrial disorder.  Any history of frequent fainting.  Your menstrual cycle and what form of birth control (contraception) you use.  Your medical history, including any prior cervical treatment.  Whether you are pregnant or may be pregnant. What are the risks? Generally, this is a safe procedure. However, problems may occur, including:  Pain.   Infection, which may include a fever, bad-smelling discharge, or pelvic pain.  Bleeding or discharge.  Misdiagnosis.  Fainting and vasovagal reactions, but this is rare.  Allergic reactions to medicines.  Damage to other structures or organs. What happens before the procedure?  If you have your menstrual period or will have it at the time of your procedure, tell your health care provider. A colposcopy typically is not done during menstruation.  Continue your contraceptive practices before and after the procedure.  For 24 hours before the colposcopy: ? Do not douche. ? Do not use tampons. ? Do not use medicines, creams, or suppositories in the vagina. ? Do not have sexual intercourse.  Ask your health care provider about: ? Changing or stopping your regular medicines. This is especially important if you are taking diabetes medicines or blood thinners. ? Taking medicines such as aspirin and ibuprofen. These medicines can thin your blood. Do not take these medicines before your procedure if your health care provider instructs you not to. It is likely that your health care provider will tell you to avoid taking aspirin or medicine that contains aspirin for 7 days before the procedure.  Follow instructions from your health care provider about eating or drinking restrictions. You will likely need to eat a regular diet the day of the procedure and not skip any meals.  You may have an exam or testing. A pregnancy test will be taken on the day of the procedure.  You may have a blood or urine sample taken.  Plan to have someone take you home from the hospital or clinic.  If you will be going  or testing. A pregnancy test will be taken on the day of the procedure.   You may have a blood or urine sample taken.   Plan to have someone take you home from the hospital or clinic.   If you will be going home right after the procedure, plan to have someone with you for 24 hours.  What happens during the procedure?   You will lie down on your back, with your feet in foot rests (stirrups).   A warmed and lubricated instrument (speculum) will be inserted into your vagina. The  speculum will be used to hold apart the walls of your vagina so your health care provider can see your cervix and the inside of your vagina.   A cotton swab will be used to place a small amount of liquid solution on the areas to be examined. This solution makes it easier to see abnormal cells. You may feel a slight burning during this part.   The colposcope will be used to scan the cervix with a bright white light. The colposcope will be held near your vulvaand will magnify your vulva, vagina, and cervix for easier examination.   Your health care provider may decide to take a biopsy. If so:  ? You may be given medicine to numb the area (local anesthetic).  ? Surgical instruments will be used to suck out mucus and cells through your vagina.  ? You may feel mild pain while the tissue sample is removed.  ? Bleeding may occur. A solution may be used to stop the bleeding.  ? If a sample of tissue is needed from the inside of the cervix, a different procedure called endocervical curettage (ECC) may be completed. During this procedure, a curved instrument (curette) will be used to scrape cells from your cervix or the top of your cervix (endocervix).   Your health care provider will record the location of any abnormalities.  The procedure may vary among health care providers and hospitals.  What happens after the procedure?   You will lie down and rest for a few minutes. You may be offered juice or cookies.   Your blood pressure, heart rate, breathing rate, and blood oxygen level will be monitored until any medicines you were given have worn off.   You may have to wear compression stockings. These stockings help to prevent blood clots and reduce swelling in your legs.   You may have some cramping in your abdomen. This should go away after a few minutes.  This information is not intended to replace advice given to you by your health care provider. Make sure you discuss any questions you have with your health care  provider.  Document Released: 11/25/2002 Document Revised: 05/02/2016 Document Reviewed: 04/10/2016  Elsevier Interactive Patient Education  2019 [employer]

## 2019-03-07 NOTE — Progress Notes (Signed)
Thanks for the info. [employer]

## 2019-03-07 NOTE — Progress Notes (Signed)
FYI, your patient.

## 2019-03-13 ENCOUNTER — Other Ambulatory Visit: Payer: Self-pay

## 2019-03-13 ENCOUNTER — Encounter: Payer: Self-pay | Admitting: Nurse Practitioner

## 2019-03-13 ENCOUNTER — Ambulatory Visit: Payer: BC Managed Care – PPO | Admitting: Nurse Practitioner

## 2019-03-13 VITALS — BP 137/85 | HR 73 | Temp 97.7°F | Resp 16 | Ht 64.0 in | Wt 241.4 lb

## 2019-03-17 ENCOUNTER — Ambulatory Visit: Payer: Self-pay | Admitting: Nurse Practitioner

## 2019-03-18 ENCOUNTER — Other Ambulatory Visit: Payer: Self-pay

## 2019-03-18 ENCOUNTER — Ambulatory Visit: Payer: BC Managed Care – PPO | Admitting: Nurse Practitioner

## 2019-03-18 ENCOUNTER — Encounter: Payer: Self-pay | Admitting: Nurse Practitioner

## 2019-03-18 VITALS — BP 124/79 | HR 96 | Temp 98.1°F | Resp 16 | Ht 62.5 in | Wt 218.6 lb

## 2019-03-18 DIAGNOSIS — R300 Dysuria: Secondary | ICD-10-CM

## 2019-03-18 DIAGNOSIS — N390 Urinary tract infection, site not specified: Secondary | ICD-10-CM

## 2019-03-18 DIAGNOSIS — B373 Candidiasis of vulva and vagina: Secondary | ICD-10-CM | POA: Insufficient documentation

## 2019-03-18 DIAGNOSIS — B3731 Acute candidiasis of vulva and vagina: Secondary | ICD-10-CM | POA: Insufficient documentation

## 2019-03-18 LAB — POCT URINALYSIS DIPSTICK
Bilirubin, UA: NEGATIVE
Glucose, UA: NEGATIVE
Ketones, UA: NEGATIVE
Nitrite, UA: NEGATIVE
Protein, UA: POSITIVE — AB
Spec Grav, UA: 1.015 (ref 1.010–1.025)
Urobilinogen, UA: 0.2 E.U./dL
pH, UA: 7 (ref 5.0–8.0)

## 2019-03-18 MED ORDER — FLUCONAZOLE 150 MG PO TABS: ORAL_TABLET | ORAL | 0 refills | Status: DC

## 2019-03-18 MED ORDER — SULFAMETHOXAZOLE-TRIMETHOPRIM 800-160 MG PO TABS: 1.0000 | ORAL_TABLET | Freq: Two times a day (BID) | ORAL | 0 refills | Status: DC

## 2019-03-18 NOTE — Progress Notes (Signed)
[facility] [place], [place] [address]  Internal MEDICINE  Office Visit Note  Patient Name: Shannon Foley  [id number]  [id number]  Date of Service: 03/18/2019  Chief Complaint  Patient presents with  . Urinary Tract Infection    frequent urination, lower abdominal pain, been going on for about 4 days, some itching , no burning      The patient is here for sick visit. She has frequency of urination. She has lower abdominal pain and vaginal itching. She also reports some vaginal discharge. Symptoms have been present for about four days. She denies nausea or vomiting. She denies fever.   Pt is here for a sick visit.     Current Medication:  Outpatient Encounter Medications as of 03/18/2019  Medication Sig  . ibuprofen (ADVIL,MOTRIN) 800 MG tablet Take 1 tablet (800 mg total) by mouth every 8 (eight) hours as needed for moderate pain.  Marland Kitchen neomycin-polymyxin-hydrocortisone (CORTISPORIN) OTIC solution Place 4 drops into both ears 4 (four) times daily.  . Norethindrone Acetate-Ethinyl Estrad-FE (JUNEL FE 24) 1-20 MG-MCG(24) tablet Take 1 tablet by mouth daily.   No facility-administered encounter medications on file as of 03/18/2019.       Medical History: Past Medical History:  Diagnosis Date  . Back pain    mid and lower back  . Chronic abdominal pain   . Chronic constipation   . Fibroids    one submucosal and one intramural (both  < 1 inch in size)  . Headache    caused by pituitary cyst  . IBS (irritable bowel syndrome)   . Motion sickness cars  . Obesity   . Pituitary cyst ([place])    Rathke's pouch cyst    Today's Vitals   03/18/19 1157  BP: 124/79  Pulse: 96  Resp: 16  Temp: 98.1 F (36.7 C)  SpO2: 100%  Weight: 218 lb 9.6 oz (99.2 kg)  Height: 5' 2.5" (1.588 m)   Body mass index is 39.35 kg/m.  Review of Systems  Constitutional: Negative for chills, fatigue, fever and unexpected weight change.  HENT: Negative for  congestion, postnasal drip, rhinorrhea, sneezing and sore throat.   Respiratory: Negative for cough, chest tightness and shortness of breath.   Cardiovascular: Negative for chest pain and palpitations.  Gastrointestinal: Positive for abdominal pain. Negative for constipation, diarrhea, nausea and vomiting.  Genitourinary: Positive for dysuria, frequency and pelvic pain.  Musculoskeletal: Positive for back pain. Negative for arthralgias, joint swelling and neck pain.  Skin: Negative for rash.  Neurological: Negative.  Negative for tremors.  Hematological: Negative for adenopathy. Does not bruise/bleed easily.  Psychiatric/Behavioral: Negative for behavioral problems (Depression), sleep disturbance and suicidal ideas. The patient is not nervous/anxious.     Physical Exam Vitals signs and nursing note reviewed.  Constitutional:      General: She is not in acute distress.    Appearance: Normal appearance. She is well-developed. She is not diaphoretic.  HENT:     Head: Normocephalic and atraumatic.     Nose: Nose normal.     Mouth/Throat:     Pharynx: No oropharyngeal exudate.  Eyes:     Pupils: Pupils are equal, round, and reactive to light.  Neck:     Musculoskeletal: Normal range of motion and neck supple.     Thyroid: No thyromegaly.     Vascular: No JVD.     Trachea: No tracheal deviation.  Cardiovascular:     Rate and Rhythm: Normal rate and regular  rhythm.     Heart sounds: Normal heart sounds. No murmur. No friction rub. No gallop.   Pulmonary:     Effort: Pulmonary effort is normal. No respiratory distress.     Breath sounds: Normal breath sounds. No wheezing or rales.  Chest:     Chest wall: No tenderness.  Abdominal:     General: Bowel sounds are normal.     Palpations: Abdomen is soft.  Genitourinary:    Comments: Urine sample positive for moderate WBC with protein and tace blood. Musculoskeletal: Normal range of motion.  Lymphadenopathy:     Cervical: No cervical  adenopathy.  Skin:    General: Skin is warm and dry.  Neurological:     Mental Status: She is alert and oriented to person, place, and time.     Cranial Nerves: No cranial nerve deficit.  Psychiatric:        Behavior: Behavior normal.        Thought Content: Thought content normal.        Judgment: Judgment normal.   Assessment/Plan:  1. Urinary tract infection without hematuria, site unspecified Start bactrim DS bid for 10 days. Send culture and sensitivity. Adjust antibiotics as indicated.  - sulfamethoxazole-trimethoprim (BACTRIM DS) 800-160 MG tablet; Take 1 tablet by mouth 2 (two) times daily.  Dispense: 20 tablet; Refill: 0  2. Vaginal candida Diflucan should be taken one. May repeat dose in 3 days if needed.  - fluconazole (DIFLUCAN) 150 MG tablet; Take 1 tablet po once. May repeat dose in 3 days as needed for persistent symptoms.  Dispense: 5 tablet; Refill: 0  3. Dysuria Recommend OTC AZO as needed and as indicated for bladder pain/spasms.  - POCT Urinalysis Dipstick - CULTURE, URINE COMPREHENSIVE  General Counseling: Ammarie verbalizes understanding of the findings of todays visit and agrees with plan of treatment. I have discussed any further diagnostic evaluation that may be needed or ordered today. We also reviewed her medications today. she has been encouraged to call the office with any questions or concerns that should arise related to todays visit.    Counseling:  Rest and increase fluids. Continue using OTC medication to control symptoms.   This patient was seen by Leretha Pol FNP Collaboration with Dr [name] as a part of collaborative care agreement  Orders Placed This Encounter  Procedures  . CULTURE, URINE COMPREHENSIVE  . POCT Urinalysis Dipstick      Time spent: 25 Minutes

## 2019-03-20 ENCOUNTER — Telehealth: Payer: Self-pay | Admitting: Obstetrics and Gynecology

## 2019-03-20 NOTE — Telephone Encounter (Signed)
Discussed CIN-1 results with patient.  Recommend follow-up Pap smear in 1 year.  Reiterated that this is important so she does not develop cancer.  She voiced understanding and agreement.

## 2019-03-20 NOTE — Progress Notes (Signed)
CIN 1 on path. Recommend repeat pap smear in one year. Patient is aware.

## 2019-03-21 LAB — CULTURE, URINE COMPREHENSIVE

## 2019-04-21 ENCOUNTER — Encounter: Payer: Self-pay | Admitting: Adult Health

## 2019-04-21 ENCOUNTER — Other Ambulatory Visit: Payer: Self-pay

## 2019-04-21 ENCOUNTER — Ambulatory Visit: Payer: BC Managed Care – PPO | Admitting: Adult Health

## 2019-04-21 VITALS — BP 126/82 | HR 82 | Temp 98.9°F | Resp 16 | Ht 63.0 in | Wt 215.0 lb

## 2019-04-21 DIAGNOSIS — K581 Irritable bowel syndrome with constipation: Secondary | ICD-10-CM

## 2019-04-21 DIAGNOSIS — K5909 Other constipation: Secondary | ICD-10-CM

## 2019-04-21 MED ORDER — DOCUSATE SODIUM 100 MG PO CAPS: 100.0000 mg | ORAL_CAPSULE | Freq: Two times a day (BID) | ORAL | 2 refills | Status: DC

## 2019-04-21 NOTE — Progress Notes (Signed)
[facility] [place], [place] [address]  Internal MEDICINE  Office Visit Note  Patient Name: Shannon Foley  [id number]  [id number]  Date of Service: 04/21/2019  Chief Complaint  Patient presents with  . Fecal Impaction    been going on for a while , needed to take a laxative.     HPI Pt is here for a sick visit. She is reporting a years worth of abdominal pain.  She also reports constipation. She has a BM daily, that she describes as hard/painful. She has taken laxatives in the past, with good results. She is also requesting a GI consult with a different physician.      Current Medication:  Outpatient Encounter Medications as of 04/21/2019  Medication Sig  . ibuprofen (ADVIL,MOTRIN) 800 MG tablet Take 1 tablet (800 mg total) by mouth every 8 (eight) hours as needed for moderate pain.  . [DISCONTINUED] fluconazole (DIFLUCAN) 150 MG tablet Take 1 tablet po once. May repeat dose in 3 days as needed for persistent symptoms. (Patient not taking: Reported on 04/21/2019)  . [DISCONTINUED] neomycin-polymyxin-hydrocortisone (CORTISPORIN) OTIC solution Place 4 drops into both ears 4 (four) times daily. (Patient not taking: Reported on 04/21/2019)  . [DISCONTINUED] Norethindrone Acetate-Ethinyl Estrad-FE (JUNEL FE 24) 1-20 MG-MCG(24) tablet Take 1 tablet by mouth daily. (Patient not taking: Reported on 04/21/2019)  . [DISCONTINUED] sulfamethoxazole-trimethoprim (BACTRIM DS) 800-160 MG tablet Take 1 tablet by mouth 2 (two) times daily. (Patient not taking: Reported on 04/21/2019)   No facility-administered encounter medications on file as of 04/21/2019.       Medical History: Past Medical History:  Diagnosis Date  . Back pain    mid and lower back  . Chronic abdominal pain   . Chronic constipation   . Fibroids    one submucosal and one intramural (both  < 1 inch in size)  . Headache    caused by pituitary cyst  . IBS (irritable bowel syndrome)   . Motion sickness  cars  . Obesity   . Pituitary cyst ([facility])    Rathke's pouch cyst     Vital Signs: BP 126/82   Pulse 82   Temp 98.9 F (37.2 C)   Resp 16   Ht 5\' 3"  (1.6 m)   Wt 215 lb (97.5 kg)   SpO2 99%   BMI 38.09 kg/m    Review of Systems  Constitutional: Negative for chills, fatigue and unexpected weight change.  HENT: Negative for congestion, rhinorrhea, sneezing and sore throat.   Eyes: Negative for photophobia, pain and redness.  Respiratory: Negative for cough, chest tightness and shortness of breath.   Cardiovascular: Negative for chest pain and palpitations.  Gastrointestinal: Negative for abdominal pain, constipation, diarrhea, nausea and vomiting.  Endocrine: Negative.   Genitourinary: Negative for dysuria and frequency.  Musculoskeletal: Negative for arthralgias, back pain, joint swelling and neck pain.  Skin: Negative for rash.  Allergic/Immunologic: Negative.   Neurological: Negative for tremors and numbness.  Hematological: Negative for adenopathy. Does not bruise/bleed easily.  Psychiatric/Behavioral: Negative for behavioral problems and sleep disturbance. The patient is not nervous/anxious.     Physical Exam Vitals signs and nursing note reviewed.  Constitutional:      General: She is not in acute distress.    Appearance: She is well-developed. She is not diaphoretic.  HENT:     Head: Normocephalic and atraumatic.     Mouth/Throat:     Pharynx: No oropharyngeal exudate.  Eyes:  Pupils: Pupils are equal, round, and reactive to light.  Neck:     Musculoskeletal: Normal range of motion and neck supple.     Thyroid: No thyromegaly.     Vascular: No JVD.     Trachea: No tracheal deviation.  Cardiovascular:     Rate and Rhythm: Normal rate and regular rhythm.     Heart sounds: Normal heart sounds. No murmur. No friction rub. No gallop.   Pulmonary:     Effort: Pulmonary effort is normal. No respiratory distress.     Breath sounds: Normal breath sounds. No  wheezing or rales.  Chest:     Chest wall: No tenderness.  Abdominal:     Palpations: Abdomen is soft.     Tenderness: There is no abdominal tenderness. There is no guarding.  Musculoskeletal: Normal range of motion.  Lymphadenopathy:     Cervical: No cervical adenopathy.  Skin:    General: Skin is warm and dry.  Neurological:     Mental Status: She is alert and oriented to person, place, and time.     Cranial Nerves: No cranial nerve deficit.  Psychiatric:        Behavior: Behavior normal.        Thought Content: Thought content normal.        Judgment: Judgment normal.    Assessment/Plan: 1. Irritable bowel syndrome with constipation Will send patient to GI for evaluation of IBS. Pt would like to see Dr. Allen Norris.  - Ambulatory referral to Gastroenterology  2. Other constipation Instructed patient - docusate sodium (COLACE) 100 MG capsule; Take 1 capsule (100 mg total) by mouth 2 (two) times daily.  Dispense: 60 capsule; Refill: 2  General Counseling: Shannon Foley verbalizes understanding of the findings of todays visit and agrees with plan of treatment. I have discussed any further diagnostic evaluation that may be needed or ordered today. We also reviewed her medications today. she has been encouraged to call the office with any questions or concerns that should arise related to todays visit.   No orders of the defined types were placed in this encounter.   No orders of the defined types were placed in this encounter.   Time spent: 20Minutes  This patient was seen by Orson Gear AGNP-C in Collaboration with Dr [name] as a part of collaborative care agreement.  Kendell Bane AGNP-C Internal Medicine

## 2019-04-30 ENCOUNTER — Other Ambulatory Visit
Admission: RE | Admit: 2019-04-30 | Discharge: 2019-04-30 | Disposition: A | Discharge status: Home / Self Care | Payer: BC Managed Care – PPO | Source: Ambulatory Visit | Attending: Adult Health | Admitting: Adult Health

## 2019-04-30 DIAGNOSIS — K581 Irritable bowel syndrome with constipation: Secondary | ICD-10-CM | POA: Insufficient documentation

## 2019-04-30 DIAGNOSIS — Z0001 Encounter for general adult medical examination with abnormal findings: Secondary | ICD-10-CM | POA: Diagnosis not present

## 2019-04-30 LAB — CBC WITH DIFFERENTIAL/PLATELET
Abs Immature Granulocytes: 0.03 10*3/uL (ref 0.00–0.07)
Basophils Absolute: 0.1 10*3/uL (ref 0.0–0.1)
Basophils Relative: 0 %
Eosinophils Absolute: 0.1 10*3/uL (ref 0.0–0.5)
Eosinophils Relative: 1 %
HCT: 38.9 % (ref 36.0–46.0)
Hemoglobin: 13 g/dL (ref 12.0–15.0)
Immature Granulocytes: 0 %
Lymphocytes Relative: 29 %
Lymphs Abs: 3.4 10*3/uL (ref 0.7–4.0)
MCH: 30.4 pg (ref 26.0–34.0)
MCHC: 33.4 g/dL (ref 30.0–36.0)
MCV: 90.9 fL (ref 80.0–100.0)
Monocytes Absolute: 0.9 10*3/uL (ref 0.1–1.0)
Monocytes Relative: 7 %
Neutro Abs: 7.2 10*3/uL (ref 1.7–7.7)
Neutrophils Relative %: 63 %
Platelets: 425 10*3/uL — ABNORMAL HIGH (ref 150–400)
RBC: 4.28 MIL/uL (ref 3.87–5.11)
RDW: 13.2 % (ref 11.5–15.5)
WBC: 11.6 10*3/uL — ABNORMAL HIGH (ref 4.0–10.5)
nRBC: 0 % (ref 0.0–0.2)

## 2019-04-30 LAB — COMPREHENSIVE METABOLIC PANEL
ALT: 10 U/L (ref 0–44)
AST: 16 U/L (ref 15–41)
Albumin: 3.8 g/dL (ref 3.5–5.0)
Alkaline Phosphatase: 76 U/L (ref 38–126)
Anion gap: 8 (ref 5–15)
BUN: 13 mg/dL (ref 6–20)
CO2: 24 mmol/L (ref 22–32)
Calcium: 8.6 mg/dL — ABNORMAL LOW (ref 8.9–10.3)
Chloride: 104 mmol/L (ref 98–111)
Creatinine, Ser: 0.65 mg/dL (ref 0.44–1.00)
GFR calc Af Amer: >60 mL/min (ref >60)
GFR calc non Af Amer: >60 mL/min (ref >60)
Glucose, Bld: 95 mg/dL (ref 70–99)
Potassium: 3.9 mmol/L (ref 3.5–5.1)
Sodium: 136 mmol/L (ref 135–145)
Total Bilirubin: 0.2 mg/dL — ABNORMAL LOW (ref 0.3–1.2)
Total Protein: 7.4 g/dL (ref 6.5–8.1)

## 2019-04-30 LAB — TYPE AND SCREEN
ABO/RH(D): O POS
Antibody Screen: NEGATIVE

## 2019-04-30 LAB — HEMOGLOBIN A1C
Hgb A1c MFr Bld: 5.3 % (ref 4.8–5.6)
Mean Plasma Glucose: 105.41 mg/dL

## 2019-04-30 LAB — LIPID PANEL
Cholesterol: 175 mg/dL (ref 0–200)
HDL: 68 mg/dL (ref >40)
LDL Cholesterol: 100 mg/dL — ABNORMAL HIGH (ref 0–99)
Total CHOL/HDL Ratio: 2.6 RATIO
Triglycerides: 34 mg/dL (ref <150)
VLDL: 7 mg/dL (ref 0–40)

## 2019-04-30 LAB — IRON AND TIBC
Iron: 29 ug/dL (ref 28–170)
Saturation Ratios: 10 % — ABNORMAL LOW (ref 10.4–31.8)
TIBC: 283 ug/dL (ref 250–450)
UIBC: 254 ug/dL

## 2019-04-30 LAB — T4, FREE: Free T4: 0.92 ng/dL (ref 0.61–1.12)

## 2019-04-30 LAB — VITAMIN B12: Vitamin B-12: 189 pg/mL (ref 180–914)

## 2019-04-30 LAB — FOLATE: Folate: 11.6 ng/mL (ref >5.9)

## 2019-04-30 LAB — TSH: TSH: 2.565 u[IU]/mL (ref 0.350–4.500)

## 2019-04-30 LAB — FERRITIN: Ferritin: 37 ng/mL (ref 11–307)

## 2019-05-01 LAB — VITAMIN D 25 HYDROXY (VIT D DEFICIENCY, FRACTURES): Vit D, 25-Hydroxy: 17.1 ng/mL — ABNORMAL LOW (ref 30.0–100.0)

## 2019-05-14 ENCOUNTER — Encounter: Payer: Self-pay | Admitting: Adult Health

## 2019-05-14 ENCOUNTER — Other Ambulatory Visit: Payer: Self-pay

## 2019-05-14 ENCOUNTER — Ambulatory Visit: Payer: BC Managed Care – PPO | Admitting: Adult Health

## 2019-05-14 VITALS — Ht 62.0 in | Wt 214.0 lb

## 2019-05-14 DIAGNOSIS — Z20828 Contact with and (suspected) exposure to other viral communicable diseases: Secondary | ICD-10-CM

## 2019-05-14 DIAGNOSIS — Z20822 Contact with and (suspected) exposure to covid-19: Secondary | ICD-10-CM

## 2019-05-14 NOTE — Progress Notes (Signed)
[facility] [place], [place] [address]  Internal MEDICINE  Telephone Visit  Patient Name: Shannon Foley  [id number]  [id number]  Date of Service: 05/14/2019  I connected with the patient at  154 by telephone and verified the patients identity using two identifiers.   I discussed the limitations, risks, security and privacy concerns of performing an evaluation and management service by telephone and the availability of in person appointments. I also discussed with the patient that there may be a patient responsible charge related to the service.  The patient expressed understanding and agrees to proceed.    Chief Complaint  Patient presents with  . Telephone Assessment  . Telephone Screen  . Medical Management of Chronic Issues    exposure to covid   . Sinusitis    HPI PT seen via video for possible covid exposure.  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  She reports she has been coughing, and had some sinus issues.  She is also having some muscle pain from coughing. She denies fever, or other symptoms.     Current Medication: Outpatient Encounter Medications as of 05/14/2019  Medication Sig  . docusate sodium (COLACE) 100 MG capsule Take 1 capsule (100 mg total) by mouth 2 (two) times daily.  Marland Kitchen ibuprofen (ADVIL,MOTRIN) 800 MG tablet Take 1 tablet (800 mg total) by mouth every 8 (eight) hours as needed for moderate pain.   No facility-administered encounter medications on file as of 05/14/2019.     Surgical History: Past Surgical History:  Procedure Laterality Date  . CYSTOSCOPY  05/10/2016   WNL  . ESOPHAGOGASTRODUODENOSCOPY (EGD) WITH PROPOFOL N/A 02/18/2016   Procedure: ESOPHAGOGASTRODUODENOSCOPY (EGD) WITH PROPOFOL;  Surgeon: Lucilla Lame, MD;  Location: [facility];  Service: Endoscopy;  Laterality: N/A;  . TONSILLECTOMY    . TONSILLECTOMY      Medical History: Past Medical History:   Diagnosis Date  . Back pain    mid and lower back  . Chronic abdominal pain   . Chronic constipation   . Fibroids    one submucosal and one intramural (both  < 1 inch in size)  . Headache    caused by pituitary cyst  . IBS (irritable bowel syndrome)   . Motion sickness cars  . Obesity   . Pituitary cyst ([facility])    Rathke's pouch cyst    Family History: Family History  Problem Relation Age of Onset  . Diabetes Mother   . Hypertension Mother   . Congestive Heart Failure Mother   . CVA Father        Had meningioma  . Diabetes Father   . Hypertension Father   . Bladder Cancer Paternal Aunt     Social History   Socioeconomic History  . Marital status: Single    Spouse name: Not on file  . Number of children: 0  . Years of education: 48  . Highest education level: Not on file  Occupational History  . Occupation: Chemical engineer  Social Needs  . Financial resource strain: Not on file  . Food insecurity    Worry: Not on file    Inability: Not on file  . Transportation needs    Medical: Not on file    Non-medical: Not on file  Tobacco Use  . Smoking status: Never Smoker  . Smokeless tobacco: Never Used  Substance and Sexual Activity  . Alcohol use: No  . Drug use:  No  . Sexual activity: Yes    Birth control/protection: Pill  Lifestyle  . Physical activity    Days per week: Not on file    Minutes per session: Not on file  . Stress: Not on file  Relationships  . Social Herbalist on phone: Not on file    Gets together: Not on file    Attends religious service: Not on file    Active member of club or organization: Not on file    Attends meetings of clubs or organizations: Not on file    Relationship status: Not on file  . Intimate partner violence    Fear of current or ex partner: Not on file    Emotionally abused: Not on file    Physically abused: Not on file    Forced sexual activity: Not on file  Other Topics Concern  . Not on file  Social  History Narrative  . Not on file      Review of Systems  Constitutional: Negative for chills, fatigue and unexpected weight change.  HENT: Negative for congestion, rhinorrhea, sneezing and sore throat.   Eyes: Negative for photophobia, pain and redness.  Respiratory: Negative for cough, chest tightness and shortness of breath.   Cardiovascular: Negative for chest pain and palpitations.  Gastrointestinal: Negative for abdominal pain, constipation, diarrhea, nausea and vomiting.  Endocrine: Negative.   Genitourinary: Negative for dysuria and frequency.  Musculoskeletal: Negative for arthralgias, back pain, joint swelling and neck pain.  Skin: Negative for rash.  Allergic/Immunologic: Negative.   Neurological: Negative for tremors and numbness.  Hematological: Negative for adenopathy. Does not bruise/bleed easily.  Psychiatric/Behavioral: Negative for behavioral problems and sleep disturbance. The patient is not nervous/anxious.     Vital Signs: Ht 5\' 2"  (1.575 m)   Wt 214 lb (97.1 kg)   BMI 39.14 kg/m    Observation/Objective:  Well appearing, nad noted.    Assessment/Plan: 1. Close Exposure to Covid-19 Virus Will get covid testing per patient request due to exposure  General Counseling: Nikyla verbalizes understanding of the findings of today's phone visit and agrees with plan of treatment. I have discussed any further diagnostic evaluation that may be needed or ordered today. We also reviewed her medications today. she has been encouraged to call the office with any questions or concerns that should arise related to todays visit.    No orders of the defined types were placed in this encounter.   No orders of the defined types were placed in this encounter.   Time spent: Walker AGNP-C Internal medicine

## 2019-05-15 ENCOUNTER — Ambulatory Visit: Payer: Self-pay | Admitting: Nurse Practitioner

## 2019-05-15 LAB — NOVEL CORONAVIRUS, NAA: SARS-CoV-2, NAA: NOT DETECTED

## 2019-05-19 ENCOUNTER — Ambulatory Visit (INDEPENDENT_AMBULATORY_CARE_PROVIDER_SITE_OTHER): Payer: BC Managed Care – PPO | Admitting: Gastroenterology

## 2019-05-19 ENCOUNTER — Encounter: Payer: Self-pay | Admitting: Gastroenterology

## 2019-05-19 ENCOUNTER — Encounter: Payer: Self-pay | Admitting: *Deleted

## 2019-05-19 ENCOUNTER — Other Ambulatory Visit: Payer: Self-pay

## 2019-05-19 VITALS — BP 117/79 | HR 81 | Temp 98.7°F | Ht 62.0 in | Wt 219.8 lb

## 2019-05-19 DIAGNOSIS — K5909 Other constipation: Secondary | ICD-10-CM | POA: Diagnosis not present

## 2019-05-19 NOTE — Progress Notes (Signed)
Gastroenterology Consultation  Referring Provider:     Kendell Bane, NP Primary Care Physician:  [name], MD Primary Gastroenterologist:  Dr. Allen Norris     Reason for Consultation:     Constipation        HPI:   Shannon Foley is a 33 y.o. y/o female referred for consultation & management of constipation by Dr. Humphrey Rolls, Timoteo Gaul, MD.  This patient has seen me in the past for constipation and abnormal CT scan of the abdomen showing pathology of the duodenum.  The patient went through an upper endoscopy by me back in July 2017.  At that time the upper endoscopy appeared normal.  Subsequent to that the patient was tried on Linzess 72 mcg by me for her constipation and she took the first dose with food despite being told to take it on empty stomach and had diarrhea.  The patient was then tried on a low dose of Amitiza.  Subsequent to that the patient went to [facility] and saw her gastroenterologist there.  The patient did not follow-up there again but then went to the [facility] and saw a physician's assistant on May 21, 2018.  The patient was treated for irritable bowel syndrome and had a lot of blood work sent off that was all normal.  The patient did not follow-up since last year with them and now comes to see me again today.  The patient reports her abdominal pain is not associated with any eating drinking or moving her bowels.  She also reports that her constipation is only one hard stool a day but she usually does not go more than 1 day without a bowel movement.  The pain is in the upper abdomen on both the right and left side.  Past Medical History:  Diagnosis Date   Back pain    mid and lower back   Chronic abdominal pain    Chronic constipation    Fibroids    one submucosal and one intramural (both  < 1 inch in size)   Headache    caused by pituitary cyst   IBS (irritable bowel syndrome)    Motion sickness cars   Obesity    Pituitary cyst ([place])    Rathke's pouch  cyst    Past Surgical History:  Procedure Laterality Date   CYSTOSCOPY  05/10/2016   WNL   ESOPHAGOGASTRODUODENOSCOPY (EGD) WITH PROPOFOL N/A 02/18/2016   Procedure: ESOPHAGOGASTRODUODENOSCOPY (EGD) WITH PROPOFOL;  Surgeon: Lucilla Lame, MD;  Location: [facility];  Service: Endoscopy;  Laterality: N/A;   TONSILLECTOMY     TONSILLECTOMY      Prior to Admission medications   Medication Sig Start Date End Date Taking? Authorizing Provider  docusate sodium (COLACE) 100 MG capsule Take 1 capsule (100 mg total) by mouth 2 (two) times daily. 04/21/19   Kendell Bane, NP  ibuprofen (ADVIL,MOTRIN) 800 MG tablet Take 1 tablet (800 mg total) by mouth every 8 (eight) hours as needed for moderate pain. 09/13/15   Sable Feil, PA-C    Family History  Problem Relation Age of Onset   Diabetes Mother    Hypertension Mother    Congestive Heart Failure Mother    CVA Father        Had meningioma   Diabetes Father    Hypertension Father    Bladder Cancer Paternal Aunt      Social History   Tobacco Use   Smoking status: Never Smoker  Smokeless tobacco: Never Used  Substance Use Topics   Alcohol use: No   Drug use: No    Allergies as of 05/19/2019   (No Known Allergies)    Review of Systems:    All systems reviewed and negative except where noted in HPI.   Physical Exam:  There were no vitals taken for this visit. No LMP recorded. General:   Alert,  Well-developed, well-nourished, pleasant and cooperative in NAD Head:  Normocephalic and atraumatic. Eyes:  Sclera clear, no icterus.   Conjunctiva pink. Ears:  Normal auditory acuity. Nose:  No deformity, discharge, or lesions. Mouth:  No deformity or lesions,oropharynx pink & moist. Neck:  Supple; no masses or thyromegaly. Lungs:  Respirations even and unlabored.  Clear throughout to auscultation.   No wheezes, crackles, or rhonchi. No acute distress. Heart:  Regular rate and rhythm; no murmurs, clicks,  rubs, or gallops. Abdomen:  Normal bowel sounds.  No bruits.  Soft, positive tenderness to one finger palpation while flexing the abdominal wall muscles and non-distended without masses, hepatosplenomegaly or hernias noted.  No guarding or rebound tenderness.  Positive Carnett sign.   Rectal:  Deferred.  Msk:  Symmetrical without gross deformities.  Good, equal movement & strength bilaterally. Pulses:  Normal pulses noted. Extremities:  No clubbing or edema.  No cyanosis. Neurologic:  Alert and oriented x3;  grossly normal neurologically. Skin:  Intact without significant lesions or rashes.  No jaundice. Lymph Nodes:  No significant cervical adenopathy. Psych:  Alert and cooperative. Normal mood and affect.  Imaging Studies: No results found.  Assessment and Plan:   Shannon Foley is a 33 y.o. y/o female who comes in today after seeing me 3 years ago and then has followed up with 2 other GI practices.  The patient now comes here with what appears to be musculoskeletal pain made worse by raising her leg 6 inches above the exam table.  The patient will be started on fiber for her hard stools that she has every day.  The patient suffers from chronic back pain which is commonly seen in patients who have abdominal wall pain.  The patient will be started on Flexeril and has been told to not drive while she is on it.  She has also been told to take anti-inflammatory medication to help with her abdominal wall pain.  She can also use compresses to the area in addition to massaging the muscle wall.  The patient has also been told to increase fiber in her diet and has been given samples of fiber supplementation.  The patient has been explained the plan and agrees with it.  Lucilla Lame, MD. Marval Regal    Note: This dictation was prepared with Dragon dictation along with smaller phrase technology. Any transcriptional errors that result from this process are unintentional.

## 2019-05-20 ENCOUNTER — Other Ambulatory Visit: Payer: Self-pay

## 2019-05-20 MED ORDER — CYCLOBENZAPRINE HCL 10 MG PO TABS: 10.0000 mg | ORAL_TABLET | Freq: Three times a day (TID) | ORAL | 0 refills | Status: DC | PRN

## 2019-06-04 ENCOUNTER — Ambulatory Visit: Payer: BC Managed Care – PPO | Admitting: Adult Health

## 2019-07-07 ENCOUNTER — Ambulatory Visit: Payer: BC Managed Care – PPO | Admitting: Adult Health

## 2019-07-23 ENCOUNTER — Ambulatory Visit (INDEPENDENT_AMBULATORY_CARE_PROVIDER_SITE_OTHER): Payer: BC Managed Care – PPO | Admitting: Obstetrics and Gynecology

## 2019-07-23 ENCOUNTER — Other Ambulatory Visit: Payer: Self-pay

## 2019-07-23 ENCOUNTER — Encounter: Payer: Self-pay | Admitting: Obstetrics and Gynecology

## 2019-07-23 VITALS — BP 116/80 | Ht 62.0 in | Wt 218.0 lb

## 2019-07-23 DIAGNOSIS — B9689 Other specified bacterial agents as the cause of diseases classified elsewhere: Secondary | ICD-10-CM

## 2019-07-23 DIAGNOSIS — R350 Frequency of micturition: Secondary | ICD-10-CM

## 2019-07-23 DIAGNOSIS — N760 Acute vaginitis: Secondary | ICD-10-CM | POA: Diagnosis not present

## 2019-07-23 LAB — POCT URINALYSIS DIPSTICK
Bilirubin, UA: NEGATIVE
Blood, UA: NEGATIVE
Glucose, UA: NEGATIVE
Ketones, UA: NEGATIVE
Nitrite, UA: NEGATIVE
Protein, UA: NEGATIVE
Spec Grav, UA: 1.01 (ref 1.010–1.025)
pH, UA: 6.5 (ref 5.0–8.0)

## 2019-07-23 LAB — POCT WET PREP WITH KOH
Clue Cells Wet Prep HPF POC: POSITIVE
KOH Prep POC: POSITIVE — AB
Trichomonas, UA: NEGATIVE
Yeast Wet Prep HPF POC: NEGATIVE

## 2019-07-23 MED ORDER — METRONIDAZOLE 500 MG PO TABS: 500.0000 mg | ORAL_TABLET | Freq: Two times a day (BID) | ORAL | 0 refills | Status: AC

## 2019-07-23 NOTE — Progress Notes (Signed)
Shannon Guise, MD   Chief Complaint  Patient presents with  . Urinary Tract Infection    pelvic area feels sore, urinary frequency, back pain, no blood or burning sensation when urinating since last week  . Vaginal Discharge    little fishy odor, itchiness and irritation since last week    HPI:      Shannon Foley is a 33 y.o. G0P0000 who LMP was Patient's last menstrual period was 07/04/2019 (exact date)., presents today for increased d/c with fishy odor, irritation for past wk. No recent abx use, no meds to treat. Hx of BV in past. Also with urinary frequency with and without good flow, LBP, pelvic discomfort. No dysuria/hematuria. No recent UTI. Uses sens skin soap, sometimes uses dryer sheets, cotton underwear. She is sex active, no new partners. No BC, wants to conceive.  Neg C&S 6/20 Last pap 9/19, CIN 1 on colpo 6/20.  Patient Active Problem List   Diagnosis Date Noted  . Vaginal candida 03/18/2019  . CIN I (cervical intraepithelial neoplasia I) 02/13/2019  . Acute otitis media 01/06/2019  . Acute upper respiratory infection 12/08/2018  . Fever and chills 12/08/2018  . Sore throat 12/08/2018  . Hiatal hernia 06/23/2018  . Left hip pain 06/23/2018  . Needs flu shot 06/23/2018  . Uterine fibroid 06/13/2018  . IBS (irritable bowel syndrome) 05/29/2018  . Low grade squamous intraepithelial lesion (LGSIL) on cervical Pap smear 05/27/2018  . Encounter for general adult medical examination with abnormal findings 04/03/2018  . Gastroesophageal reflux disease without esophagitis 04/03/2018  . Chronic midline low back pain without sciatica 04/03/2018  . Chronic midline thoracic back pain 04/03/2018  . Urinary tract infection without hematuria 04/03/2018  . Dysuria 04/03/2018  . Chest pain 01/08/2018  . Epigastric abdominal pain 01/08/2018  . Abnormal findings-gastrointestinal tract   . Rathke's cleft cyst ([place]) 02/02/2016  . Anemia 04/29/2014  . Fatigue 04/29/2014   . Hyperprolactinemia ([place]) 04/29/2014  . Vitamin B 12 deficiency 04/29/2014  . 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 Pituitary lesion ([place]) 02/19/2014  . New onset of headaches 02/19/2014  . Other enthesopathy of unspecified foot 12/18/2013  . Pain in foot 12/18/2013  . Dupuytren's contracture of foot 12/18/2013  . Plantar fasciitis 12/18/2013  . Calcaneal bursitis 12/18/2013  . Plantar fascial fibromatosis 12/18/2013    Past Surgical History:  Procedure Laterality Date  . CYSTOSCOPY  05/10/2016   WNL  . ESOPHAGOGASTRODUODENOSCOPY (EGD) WITH PROPOFOL N/A 02/18/2016   Procedure: ESOPHAGOGASTRODUODENOSCOPY (EGD) WITH PROPOFOL;  Surgeon: Lucilla Lame, MD;  Location: [facility];  Service: Endoscopy;  Laterality: N/A;  . TONSILLECTOMY    . TONSILLECTOMY      Family History  Problem Relation Age of Onset  . Diabetes Mother   . Hypertension Mother   . Congestive Heart Failure Mother   . CVA Father        Had meningioma  . Diabetes Father   . Hypertension Father   . Bladder Cancer Paternal Aunt     Social History   Socioeconomic History  . Marital status: Single    Spouse name: Not on file  . Number of children: 0  . Years of education: 70  . Highest education level: Not on file  Occupational History  . Occupation: Chemical engineer  Social Needs  . Financial resource strain: Not on file  . Food insecurity    Worry: Not on file  Inability: Not on file  . Transportation needs    Medical: Not on file    Non-medical: Not on file  Tobacco Use  . Smoking status: Never Smoker  . Smokeless tobacco: Never Used  Substance and Sexual Activity  . Alcohol use: No  . Drug use: No  . Sexual activity: Yes    Birth control/protection: None  Lifestyle  . Physical activity    Days per week: Not on file    Minutes per session: Not on file  . Stress: Not on file  Relationships  . Social Herbalist on phone: Not  on file    Gets together: Not on file    Attends religious service: Not on file    Active member of club or organization: Not on file    Attends meetings of clubs or organizations: Not on file    Relationship status: Not on file  . Intimate partner violence    Fear of current or ex partner: Not on file    Emotionally abused: Not on file    Physically abused: Not on file    Forced sexual activity: Not on file  Other Topics Concern  . Not on file  Social History Narrative  . Not on file    Outpatient Medications Prior to Visit  Medication Sig Dispense Refill  . cyclobenzaprine (FLEXERIL) 10 MG tablet Take 1 tablet (10 mg total) by mouth 3 (three) times daily as needed for muscle spasms. 30 tablet 0  . docusate sodium (COLACE) 100 MG capsule Take 1 capsule (100 mg total) by mouth 2 (two) times daily. 60 capsule 2  . ibuprofen (ADVIL,MOTRIN) 800 MG tablet Take 1 tablet (800 mg total) by mouth every 8 (eight) hours as needed for moderate pain. 15 tablet 0   No facility-administered medications prior to visit.       ROS:  Review of Systems  Constitutional: Negative for fatigue, fever and unexpected weight change.  Respiratory: Negative for cough, shortness of breath and wheezing.   Cardiovascular: Negative for chest pain, palpitations and leg swelling.  Gastrointestinal: Negative for blood in stool, constipation, diarrhea, nausea and vomiting.  Endocrine: Negative for cold intolerance, heat intolerance and polyuria.  Genitourinary: Positive for frequency, pelvic pain and vaginal discharge. Negative for dyspareunia, dysuria, flank pain, genital sores, hematuria, menstrual problem, urgency, vaginal bleeding and vaginal pain.  Musculoskeletal: Positive for back pain. Negative for joint swelling and myalgias.  Skin: Negative for rash.  Neurological: Negative for dizziness, syncope, light-headedness, numbness and headaches.  Hematological: Negative for adenopathy.   Psychiatric/Behavioral: Negative for agitation, confusion, sleep disturbance and suicidal ideas. The patient is not nervous/anxious.    OBJECTIVE:   Vitals:  BP 116/80   Ht 5\' 2"  (1.575 m)   Wt 218 lb (98.9 kg)   LMP 07/04/2019 (Exact Date)   BMI 39.87 kg/m   Physical Exam Vitals signs reviewed.  Constitutional:      Appearance: She is well-developed.  Neck:     Musculoskeletal: Normal range of motion.  Pulmonary:     Effort: Pulmonary effort is normal.  Abdominal:     Palpations: Abdomen is soft.     Tenderness: There is abdominal tenderness in the suprapubic area. There is no guarding or rebound.  Genitourinary:    General: Normal vulva.     Pubic Area: No rash.      Labia:        Right: No rash, tenderness or lesion.  Left: No rash, tenderness or lesion.      Vagina: Vaginal discharge present. No erythema or tenderness.     Cervix: Normal.     Uterus: Normal. Tender. Not enlarged.      Adnexa: Right adnexa normal and left adnexa normal.       Right: No mass or tenderness.         Left: No mass or tenderness.    Musculoskeletal: Normal range of motion.  Skin:    General: Skin is warm and dry.  Neurological:     General: No focal deficit present.     Mental Status: She is alert and oriented to person, place, and time.  Psychiatric:        Mood and Affect: Mood normal.        Behavior: Behavior normal.        Thought Content: Thought content normal.        Judgment: Judgment normal.     Results: Results for orders placed or performed in visit on 07/23/19 (from the past 24 hour(s))  POCT Wet Prep with KOH     Status: Abnormal   Collection Time: 07/23/19  9:31 AM  Result Value Ref Range   Trichomonas, UA Negative    Clue Cells Wet Prep HPF POC pos    Epithelial Wet Prep HPF POC     Yeast Wet Prep HPF POC neg    Bacteria Wet Prep HPF POC     RBC Wet Prep HPF POC     WBC Wet Prep HPF POC     KOH Prep POC Positive (A) Negative  POCT Urinalysis Dipstick      Status: Abnormal   Collection Time: 07/23/19  9:31 AM  Result Value Ref Range   Color, UA yellow    Clarity, UA clear    Glucose, UA Negative Negative   Bilirubin, UA neg    Ketones, UA neg    Spec Grav, UA 1.010 1.010 - 1.025   Blood, UA neg    pH, UA 6.5 5.0 - 8.0   Protein, UA Negative Negative   Urobilinogen, UA     Nitrite, UA neg    Leukocytes, UA Trace (A) Negative   Appearance     Odor       Assessment/Plan: Bacterial vaginosis - Plan: metroNIDAZOLE (FLAGYL) 500 MG tablet, POCT Wet Prep with KOH; Pos sx/wet prep. Rx flagyl. No EtOH. Will RF if sx recur. F/u prn.   Urinary frequency - Plan: Urine Culture, POCT Urinalysis Dipstick, essent neg UA. Check C&S. Will f/u if pos. If neg, most likely related to BV   Meds ordered this encounter  Medications  . metroNIDAZOLE (FLAGYL) 500 MG tablet    Sig: Take 1 tablet (500 mg total) by mouth 2 (two) times daily for 7 days.    Dispense:  14 tablet    Refill:  0    Order Specific Question:   Supervising Provider    Answer:   Gae Dry [id number]      Return if symptoms worsen or fail to improve.  Karo Rog B. Schon Zeiders, PA-C 07/23/2019 9:34 AM

## 2019-07-23 NOTE — Patient Instructions (Signed)
I value your feedback and entrusting us with your care. If you get a [facility] patient survey, I would appreciate you taking the time to let us know about your experience today. Thank you! 

## 2019-07-25 LAB — URINE CULTURE: Organism ID, Bacteria: NO GROWTH

## 2019-08-25 ENCOUNTER — Ambulatory Visit: Payer: BC Managed Care – PPO | Admitting: Adult Health

## 2019-08-25 ENCOUNTER — Other Ambulatory Visit: Payer: Self-pay

## 2019-08-25 ENCOUNTER — Encounter: Payer: Self-pay | Admitting: Adult Health

## 2019-08-25 VITALS — BP 130/85 | HR 73 | Temp 97.6°F | Resp 16 | Ht 62.0 in | Wt 223.0 lb

## 2019-08-25 DIAGNOSIS — K219 Gastro-esophageal reflux disease without esophagitis: Secondary | ICD-10-CM | POA: Diagnosis not present

## 2019-08-25 DIAGNOSIS — K581 Irritable bowel syndrome with constipation: Secondary | ICD-10-CM

## 2019-08-25 NOTE — Progress Notes (Signed)
[facility] [place],  [id number]  Internal MEDICINE  Office Visit Note  Patient Name: Shannon Foley  [id number]  [id number]  Date of Service: 09/11/2019  Chief Complaint  Patient presents with  . Diarrhea    been having mucousy diarrhea for the last three days , cold chills , stomach cramping      HPI Pt is here for a sick visit. Patient is complaining of abnormal bowel movements. Three days ago bowel movement was in the form of hard balls, for the last 2 days she has experienced diarrhea. With the diarrhea she noticed moderate amount of mucous, mucous was clear, stool was brown and/or green. Also, with the mucous she has noticed that it has streaks of bright red blood, does not recall seeing bright red blood mixed within her stool. Also complains of bilateral lower quadrant abdominal pain that lasts all day that also started 3 days ago, the pain today is better, pain seems to be radiating to her lower back. For the past two days she has had more than 5 episodes of diarrhea daily. Denies changes in diet or medications, also denies fevers. Has complications with chronic constipation. Denies nausea or vomiting.   Current Medication:  Outpatient Encounter Medications as of 08/25/2019  Medication Sig  . cyclobenzaprine (FLEXERIL) 10 MG tablet Take 1 tablet (10 mg total) by mouth 3 (three) times daily as needed for muscle spasms.  Marland Kitchen docusate sodium (COLACE) 100 MG capsule Take 1 capsule (100 mg total) by mouth 2 (two) times daily.  Marland Kitchen ibuprofen (ADVIL,MOTRIN) 800 MG tablet Take 1 tablet (800 mg total) by mouth every 8 (eight) hours as needed for moderate pain.   No facility-administered encounter medications on file as of 08/25/2019.      Medical History: Past Medical History:  Diagnosis Date  . Back pain    mid and lower back  . Chronic abdominal pain   . Chronic constipation   . Fibroids    one submucosal and one intramural (both  < 1 inch in  size)  . Headache    caused by pituitary cyst  . IBS (irritable bowel syndrome)   . Motion sickness cars  . Obesity   . Pituitary cyst ([facility])    Rathke's pouch cyst     Vital Signs: BP 130/85   Pulse 73   Temp 97.6 F (36.4 C)   Resp 16   Ht 5\' 2"  (1.575 m)   Wt 223 lb (101.2 kg)   SpO2 99%   BMI 40.79 kg/m    Review of Systems  Constitutional: Negative for chills, fatigue and unexpected weight change.  HENT: Negative for congestion, rhinorrhea, sneezing and sore throat.   Eyes: Negative for photophobia, pain and redness.  Respiratory: Negative for cough, chest tightness and shortness of breath.   Cardiovascular: Negative for chest pain and palpitations.  Gastrointestinal: Positive for abdominal pain and diarrhea. Negative for constipation, nausea and vomiting.       Day one hard stool followed by 2 days of diarrhea with mucous. Bright red blood streaks in mucous.  Endocrine: Negative.   Genitourinary: Negative for dysuria and frequency.  Musculoskeletal: Negative for arthralgias, back pain, joint swelling and neck pain.  Skin: Negative for rash.  Allergic/Immunologic: Negative.   Neurological: Negative for tremors and numbness.  Hematological: Negative for adenopathy. Does not bruise/bleed easily.  Psychiatric/Behavioral: Negative for behavioral problems and sleep disturbance. The patient is not nervous/anxious.  Physical Exam Vitals signs and nursing note reviewed.  Constitutional:      General: She is not in acute distress.    Appearance: She is well-developed. She is not diaphoretic.  HENT:     Head: Normocephalic and atraumatic.     Mouth/Throat:     Pharynx: No oropharyngeal exudate.  Eyes:     Pupils: Pupils are equal, round, and reactive to light.  Neck:     Musculoskeletal: Normal range of motion and neck supple.     Thyroid: No thyromegaly.     Vascular: No JVD.     Trachea: No tracheal deviation.  Cardiovascular:     Rate and Rhythm: Normal  rate and regular rhythm.     Heart sounds: Normal heart sounds. No murmur. No friction rub. No gallop.   Pulmonary:     Effort: Pulmonary effort is normal. No respiratory distress.     Breath sounds: Normal breath sounds. No wheezing or rales.  Chest:     Chest wall: No tenderness.  Abdominal:     Palpations: Abdomen is soft.     Tenderness: There is abdominal tenderness. There is no guarding.  Musculoskeletal: Normal range of motion.  Lymphadenopathy:     Cervical: No cervical adenopathy.  Skin:    General: Skin is warm and dry.  Neurological:     Mental Status: She is alert and oriented to person, place, and time.     Cranial Nerves: No cranial nerve deficit.  Psychiatric:        Behavior: Behavior normal.        Thought Content: Thought content normal.        Judgment: Judgment normal.     Assessment/Plan: 1. Irritable Bowel syndrome with constipation Discussed adding fiber replacement to bulk up stools and addition of a probiotic daily. Discussed continuing to monitor stools for bright red blood or dark color, increase in frequency or if symptoms do not resolve in a few days to call office for follow-up. If symptoms persist may consider stool sample testing. Continue with good PO fluid intake.    2. Gastroesophageal reflux disease without esophagitis Intermittent, continue otc med use prn.  General Counseling: Tiffane verbalizes understanding of the findings of todays visit and agrees with plan of treatment. I have discussed any further diagnostic evaluation that may be needed or ordered today. We also reviewed her medications today. she has been encouraged to call the office with any questions or concerns that should arise related to todays visit.   No orders of the defined types were placed in this encounter.   No orders of the defined types were placed in this encounter.   Time spent: 15 Minutes  This patient was seen by Orson Gear AGNP-C in Collaboration with Dr  [name] as a part of collaborative care agreement.  Kendell Bane AGNP-C Internal Medicine

## 2019-09-26 DIAGNOSIS — D352 Benign neoplasm of pituitary gland: Secondary | ICD-10-CM | POA: Diagnosis not present

## 2019-09-26 DIAGNOSIS — E237 Disorder of pituitary gland, unspecified: Secondary | ICD-10-CM | POA: Diagnosis not present

## 2019-09-26 DIAGNOSIS — E236 Other disorders of pituitary gland: Secondary | ICD-10-CM | POA: Diagnosis not present

## 2019-10-01 ENCOUNTER — Ambulatory Visit: Payer: Medicaid Other | Attending: Internal Medicine

## 2019-10-01 DIAGNOSIS — Z20822 Contact with and (suspected) exposure to covid-19: Secondary | ICD-10-CM | POA: Diagnosis not present

## 2019-10-02 LAB — NOVEL CORONAVIRUS, NAA: SARS-CoV-2, NAA: NOT DETECTED

## 2019-11-11 ENCOUNTER — Ambulatory Visit (INDEPENDENT_AMBULATORY_CARE_PROVIDER_SITE_OTHER): Payer: BC Managed Care – PPO | Admitting: Obstetrics and Gynecology

## 2019-11-11 ENCOUNTER — Ambulatory Visit (INDEPENDENT_AMBULATORY_CARE_PROVIDER_SITE_OTHER): Payer: BC Managed Care – PPO

## 2019-11-11 ENCOUNTER — Encounter: Payer: Self-pay | Admitting: Obstetrics and Gynecology

## 2019-11-11 ENCOUNTER — Other Ambulatory Visit: Payer: Self-pay

## 2019-11-11 VITALS — BP 100/80 | Ht 63.0 in | Wt 223.0 lb

## 2019-11-11 DIAGNOSIS — D219 Benign neoplasm of connective and other soft tissue, unspecified: Secondary | ICD-10-CM

## 2019-11-11 DIAGNOSIS — N898 Other specified noninflammatory disorders of vagina: Secondary | ICD-10-CM | POA: Diagnosis not present

## 2019-11-11 DIAGNOSIS — R102 Pelvic and perineal pain: Secondary | ICD-10-CM

## 2019-11-11 DIAGNOSIS — D252 Subserosal leiomyoma of uterus: Secondary | ICD-10-CM

## 2019-11-11 DIAGNOSIS — R350 Frequency of micturition: Secondary | ICD-10-CM | POA: Diagnosis not present

## 2019-11-11 DIAGNOSIS — D251 Intramural leiomyoma of uterus: Secondary | ICD-10-CM | POA: Diagnosis not present

## 2019-11-11 LAB — POCT WET PREP WITH KOH
Clue Cells Wet Prep HPF POC: NEGATIVE
KOH Prep POC: NEGATIVE
Trichomonas, UA: NEGATIVE
Yeast Wet Prep HPF POC: NEGATIVE

## 2019-11-11 LAB — POCT URINALYSIS DIPSTICK
Bilirubin, UA: NEGATIVE
Blood, UA: NEGATIVE
Glucose, UA: NEGATIVE
Ketones, UA: NEGATIVE
Nitrite, UA: NEGATIVE
Protein, UA: NEGATIVE
Spec Grav, UA: 1.02 (ref 1.010–1.025)
pH, UA: 6 (ref 5.0–8.0)

## 2019-11-11 NOTE — Progress Notes (Signed)
Shannon Guise, MD   Chief Complaint  Patient presents with  . Pelvic Pain    some frequency, no burning, entire pelvic pain, back pain x 1 week    HPI:      [name] is a 34 y.o. [id number] who LMP was Patient's last menstrual period was 10/31/2019 (exact date)., presents today for pelvic pain for the past wk. Pain is sharp, fleeting, lasting a few min. No aggrav/allev factors. Has hard stools/constipation daily. Had nausea this wk, no change in appetite though. No postprandial pain, no relief with BM. Hx of IBS and uterine fibroids. No urin sx except frequency, no fevers. Has had increased vag d/c with irritation, no odor for the past wk. Hx of BV but doesn't feel like that. Did recently change unscented soap. No recent abx use.  Has also had dull, achy back pain, as well as sharp pains throughout her body. No myalgias (except back pain). Taken midol without relief.  She is sex active, not using BC. No dyspareunia/postcoital bleeding. LMP was normal except had more clots. Menses are monthly, last 3 days, light to mod flow, no BTB, mild to mod dysmen, improved with NSAIDs. No new partners.  Neg STD testing 9/19.    Past Medical History:  Diagnosis Date  . Back pain    mid and lower back  . Chronic abdominal pain   . Chronic constipation   . Fibroids    one submucosal and one intramural (both  < 1 inch in size)  . Headache    caused by pituitary cyst  . IBS (irritable bowel syndrome)   . Motion sickness cars  . Obesity   . Pituitary cyst ([place])    Rathke's pouch cyst    Past Surgical History:  Procedure Laterality Date  . CYSTOSCOPY  05/10/2016   WNL  . ESOPHAGOGASTRODUODENOSCOPY (EGD) WITH PROPOFOL N/A 02/18/2016   Procedure: ESOPHAGOGASTRODUODENOSCOPY (EGD) WITH PROPOFOL;  Surgeon: Lucilla Lame, MD;  Location: [facility];  Service: Endoscopy;  Laterality: N/A;  . TONSILLECTOMY    . TONSILLECTOMY      Family History  Problem Relation Age of Onset  .  Diabetes Mother   . Hypertension Mother   . Congestive Heart Failure Mother   . CVA Father        Had meningioma  . Diabetes Father   . Hypertension Father   . Bladder Cancer Paternal Aunt     Social History   Socioeconomic History  . Marital status: Single    Spouse name: Not on file  . Number of children: 0  . Years of education: 40  . Highest education level: Not on file  Occupational History  . Occupation: Chemical engineer  Tobacco Use  . Smoking status: Never Smoker  . Smokeless tobacco: Never Used  Substance and Sexual Activity  . Alcohol use: No  . Drug use: No  . Sexual activity: Yes    Birth control/protection: None  Other Topics Concern  . Not on file  Social History Narrative  . Not on file   Social Determinants of Health   Financial Resource Strain:   . Difficulty of Paying Living Expenses: Not on file  Food Insecurity:   . Worried About Charity fundraiser in the Last Year: Not on file  . Ran Out of Food in the Last Year: Not on file  Transportation Needs:   . Lack of Transportation (Medical): Not on file  . Lack of Transportation (  Non-Medical): Not on file  Physical Activity:   . Days of Exercise per Week: Not on file  . Minutes of Exercise per Session: Not on file  Stress:   . Feeling of Stress : Not on file  Social Connections:   . Frequency of Communication with Friends and Family: Not on file  . Frequency of Social Gatherings with Friends and Family: Not on file  . Attends Religious Services: Not on file  . Active Member of Clubs or Organizations: Not on file  . Attends Archivist Meetings: Not on file  . Marital Status: Not on file  Intimate Partner Violence:   . Fear of Current or Ex-Partner: Not on file  . Emotionally Abused: Not on file  . Physically Abused: Not on file  . Sexually Abused: Not on file    Outpatient Medications Prior to Visit  Medication Sig Dispense Refill  . ibuprofen (ADVIL,MOTRIN) 800 MG tablet Take 1  tablet (800 mg total) by mouth every 8 (eight) hours as needed for moderate pain. 15 tablet 0  . cyclobenzaprine (FLEXERIL) 10 MG tablet Take 1 tablet (10 mg total) by mouth 3 (three) times daily as needed for muscle spasms. 30 tablet 0  . docusate sodium (COLACE) 100 MG capsule Take 1 capsule (100 mg total) by mouth 2 (two) times daily. 60 capsule 2   No facility-administered medications prior to visit.      ROS:  Review of Systems  Constitutional: Negative for fatigue, fever and unexpected weight change.  Respiratory: Negative for cough, shortness of breath and wheezing.   Cardiovascular: Negative for chest pain, palpitations and leg swelling.  Gastrointestinal: Positive for nausea. Negative for blood in stool, constipation, diarrhea and vomiting.  Endocrine: Negative for cold intolerance, heat intolerance and polyuria.  Genitourinary: Positive for frequency, pelvic pain and vaginal discharge. Negative for dyspareunia, dysuria, flank pain, genital sores, hematuria, menstrual problem, urgency, vaginal bleeding and vaginal pain.  Musculoskeletal: Negative for back pain, joint swelling and myalgias.  Skin: Negative for rash.  Neurological: Positive for headaches. Negative for dizziness, syncope, light-headedness and numbness.  Hematological: Negative for adenopathy.  Psychiatric/Behavioral: Negative for agitation, confusion, sleep disturbance and suicidal ideas. The patient is not nervous/anxious.   BREAST: No symptoms   OBJECTIVE:   Vitals:  BP 100/80   Ht 5\' 3"  (1.6 m)   Wt 223 lb (101.2 kg)   LMP 10/31/2019 (Exact Date)   BMI 39.50 kg/m   Physical Exam Vitals reviewed.  Constitutional:      Appearance: She is well-developed. She is not ill-appearing or toxic-appearing.  Pulmonary:     Effort: Pulmonary effort is normal.  Abdominal:     Palpations: Abdomen is soft.     Tenderness: There is abdominal tenderness in the right lower quadrant, suprapubic area and left lower  quadrant. There is no right CVA tenderness, left CVA tenderness, guarding or rebound.  Genitourinary:    General: Normal vulva.     Pubic Area: No rash.      Labia:        Right: No rash, tenderness or lesion.        Left: No rash, tenderness or lesion.      Vagina: Normal. No vaginal discharge, erythema or tenderness.     Cervix: Cervical motion tenderness present.     Uterus: Normal. Tender. Not enlarged.      Adnexa:        Right: Tenderness present. No mass.  Left: Tenderness present. No mass.       Comments: NEG EXT VAG EXAM FOR ERYTHEMA/IRRITATION Musculoskeletal:        General: Normal range of motion.       Arms:     Cervical back: Normal range of motion.  Skin:    General: Skin is warm and dry.  Neurological:     General: No focal deficit present.     Mental Status: She is alert and oriented to person, place, and time.     Cranial Nerves: No cranial nerve deficit.  Psychiatric:        Mood and Affect: Mood normal.        Behavior: Behavior normal.        Thought Content: Thought content normal.        Judgment: Judgment normal.     Results: Results for orders placed or performed in visit on 11/11/19 (from the past 24 hour(s))  POCT Wet Prep with KOH     Status: Normal   Collection Time: 11/11/19  3:03 PM  Result Value Ref Range   Trichomonas, UA Negative    Clue Cells Wet Prep HPF POC neg    Epithelial Wet Prep HPF POC     Yeast Wet Prep HPF POC neg    Bacteria Wet Prep HPF POC     RBC Wet Prep HPF POC     WBC Wet Prep HPF POC     KOH Prep POC Negative Negative  POCT Urinalysis Dipstick     Status: Abnormal   Collection Time: 11/11/19  3:04 PM  Result Value Ref Range   Color, UA pale    Clarity, UA clear    Glucose, UA Negative Negative   Bilirubin, UA neg    Ketones, UA neg    Spec Grav, UA 1.020 1.010 - 1.025   Blood, UA neg    pH, UA 6.0 5.0 - 8.0   Protein, UA Negative Negative   Urobilinogen, UA     Nitrite, UA neg    Leukocytes, UA  Trace (A) Negative   Appearance     Odor     ULTRASOUND REPORT  Location: [facility]  Date of Service: 11/11/2019    Indications:Pelvic Pain Findings:  The uterus is anteverted and measures 8.1 x 5.4 x 5.8 cm. Echo texture is heterogenous with evidence of focal masses. Within the uterus are multiple suspected fibroids measuring: Fibroid 1:34.1 x 28.3 x 29.2 mm intramural posterior Fibroid 2:8.0 x 6.2 x 7.5 mm anterior subserosal Fibroid 3: 25.2 x 27.3 x 26.3 mm subserosal left The Endometrium measures 6.9 mm.  Right Ovary measures 3.7 x 2.7 x 2.3 cm. It is normal in appearance. There is a dominant follicle in the right ovary measuring 20.2 x 18.6 x 20.3 mm.  Left Ovary measures 2.7 x 2.4 x 2.2 cm. It is normal in appearance. Survey of the adnexa demonstrates no adnexal masses. There is no free fluid in the cul de sac.  Impression: 1. Normal appearing endometrium.  2. There are three uterine fibroids seen.  3. Normal ovaries.   Recommendations: 1.Clinical correlation with the patient's History and Physical Exam.  Gweneth Dimitri, RT  Assessment/Plan: Pelvic pain - Plan: US PELVIS TRANSVAGINAL NON-OB (TV ONLY), Chlamydia/Gonococcus/Trichomonas, NAA; Tender on exam, sx for 1 wk. Neg UA, neg GYN u/s except stable leio. Check C&S and STD. Will f/u with results. If neg, question GI etiology due to constipation/hard stools. Can f/u with PCP prn sx.  Vaginal discharge - Plan: POCT Wet Prep with KOH, Chlamydia/Gonococcus/Trichomonas, NAA; Neg wet prep. Check STD. If neg, reassurance.   Urinary frequency - Plan: POCT Urinalysis Dipstick, Urine Culture; Neg UA. Check C&S. Will f/u if pos.  Leiomyoma--stable on u/s. F/u prn.     Return if symptoms worsen or fail to improve.  Chiana Wamser B. Sherrika Weakland, PA-C 11/11/2019 3:40 PM

## 2019-11-11 NOTE — Patient Instructions (Signed)
I value your feedback and entrusting us with your care. If you get a [facility] patient survey, I would appreciate you taking the time to let us know about your experience today. Thank you!  As of August 28, 2019, your lab results will be released to your MyChart immediately, before I even have a chance to see them. Please give me time to review them and contact you if there are any abnormalities. Thank you for your patience.  

## 2019-11-13 LAB — CHLAMYDIA/GONOCOCCUS/TRICHOMONAS, NAA
Chlamydia by NAA: NEGATIVE
Gonococcus by NAA: NEGATIVE
Trich vag by NAA: NEGATIVE

## 2019-11-13 LAB — URINE CULTURE

## 2019-11-19 ENCOUNTER — Telehealth: Payer: Self-pay

## 2019-11-19 ENCOUNTER — Other Ambulatory Visit: Payer: Self-pay

## 2019-11-19 ENCOUNTER — Encounter: Payer: Self-pay | Admitting: Adult Health

## 2019-11-19 ENCOUNTER — Ambulatory Visit: Payer: BC Managed Care – PPO | Admitting: Adult Health

## 2019-11-19 VITALS — BP 126/86 | HR 92 | Temp 97.9°F | Resp 16 | Ht 63.0 in | Wt 223.0 lb

## 2019-11-19 DIAGNOSIS — K219 Gastro-esophageal reflux disease without esophagitis: Secondary | ICD-10-CM | POA: Diagnosis not present

## 2019-11-19 DIAGNOSIS — K581 Irritable bowel syndrome with constipation: Secondary | ICD-10-CM | POA: Diagnosis not present

## 2019-11-19 DIAGNOSIS — E559 Vitamin D deficiency, unspecified: Secondary | ICD-10-CM | POA: Diagnosis not present

## 2019-11-19 MED ORDER — ERGOCALCIFEROL 1.25 MG (50000 UT) PO CAPS: 50000.0000 [IU] | ORAL_CAPSULE | ORAL | 0 refills | Status: DC

## 2019-11-19 NOTE — Telephone Encounter (Signed)
Pt calling for results from the 23rd.  [phone]  Adv pt of ABC's msg in MyChart - labs normal; see PCP.

## 2019-11-19 NOTE — Progress Notes (Signed)
[facility] [place], [place] [address]  Internal MEDICINE  Office Visit Note  Patient Name: Shannon Foley  [id number]  MW:[id number]  Date of Service: 12/27/2019  Chief Complaint  Patient presents with  . Abdominal Pain    sharp pains on right side for months went to GI doctor and they didnt find anything      HPI Pt is here for a sick visit. Pt reports continues RLQ pain. As well as LUQ pain.  She has had extensive work up including EDG and colonoscopy in the past. This has been going on for a few years. She was eventually diagnosed with IBS, and abdominal wall pain.  She did have a thickened duodenum on a CT from 2017, as well as possible uterine fibroids.  She feels like the pain is worse now and more frequent.  She would benefit from being scanned again at this time to evaluate for any progression.      Current Medication:  Outpatient Encounter Medications as of 11/19/2019  Medication Sig  . ibuprofen (ADVIL,MOTRIN) 800 MG tablet Take 1 tablet (800 mg total) by mouth every 8 (eight) hours as needed for moderate pain.  . [DISCONTINUED] ergocalciferol (DRISDOL) 1.25 MG (50000 UT) capsule Take 1 capsule (50,000 Units total) by mouth once a week.   No facility-administered encounter medications on file as of 11/19/2019.      Medical History: Past Medical History:  Diagnosis Date  . Back pain    mid and lower back  . Chronic abdominal pain   . Chronic constipation   . Fibroids    one submucosal and one intramural (both  < 1 inch in size)  . Headache    caused by pituitary cyst  . IBS (irritable bowel syndrome)   . Motion sickness cars  . Obesity   . Pituitary cyst ([facility])    Rathke's pouch cyst     Vital Signs: BP 126/86   Pulse 92   Temp 97.9 F (36.6 C)   Resp 16   Ht 5\' 3"  (1.6 m)   Wt 223 lb (101.2 kg)   LMP 10/31/2019 (Exact Date)   SpO2 96%   BMI 39.50 kg/m    Review of Systems  Constitutional: Negative for chills,  fatigue and unexpected weight change.  HENT: Negative for congestion, rhinorrhea, sneezing and sore throat.   Eyes: Negative for photophobia, pain and redness.  Respiratory: Negative for cough, chest tightness and shortness of breath.   Cardiovascular: Negative for chest pain and palpitations.  Gastrointestinal: Positive for abdominal pain, constipation and nausea. Negative for blood in stool, diarrhea and vomiting.  Endocrine: Negative.   Genitourinary: Negative for dysuria and frequency.  Musculoskeletal: Negative for arthralgias, back pain, joint swelling and neck pain.  Skin: Negative for rash.  Allergic/Immunologic: Negative.   Neurological: Negative for tremors and numbness.  Hematological: Negative for adenopathy. Does not bruise/bleed easily.  Psychiatric/Behavioral: Negative for behavioral problems and sleep disturbance. The patient is not nervous/anxious.     Physical Exam Vitals and nursing note reviewed.  Constitutional:      General: She is not in acute distress.    Appearance: She is well-developed. She is not diaphoretic.  HENT:     Head: Normocephalic and atraumatic.     Mouth/Throat:     Pharynx: No oropharyngeal exudate.  Eyes:     Pupils: Pupils are equal, round, and reactive to light.  Neck:     Thyroid: No thyromegaly.  Vascular: No JVD.     Trachea: No tracheal deviation.  Cardiovascular:     Rate and Rhythm: Normal rate and regular rhythm.     Heart sounds: Normal heart sounds. No murmur. No friction rub. No gallop.   Pulmonary:     Effort: Pulmonary effort is normal. No respiratory distress.     Breath sounds: Normal breath sounds. No wheezing or rales.  Chest:     Chest wall: No tenderness.  Abdominal:     Palpations: Abdomen is soft.     Tenderness: There is abdominal tenderness in the right lower quadrant and left upper quadrant. There is no guarding.  Musculoskeletal:        General: Normal range of motion.     Cervical back: Normal range of  motion and neck supple.  Lymphadenopathy:     Cervical: No cervical adenopathy.  Skin:    General: Skin is warm and dry.  Neurological:     Mental Status: She is alert and oriented to person, place, and time.     Cranial Nerves: No cranial nerve deficit.  Psychiatric:        Behavior: Behavior normal.        Thought Content: Thought content normal.        Judgment: Judgment normal.    Assessment/Plan: 1. Irritable bowel syndrome with constipation Will follow up after Ct.  - CT Abdomen Pelvis W Contrast; Future  2. Gastroesophageal reflux disease without esophagitis Stable, continue present therapy.   3. Vitamin D deficiency Take Vit D as prescribed.    General Counseling: Shannon Foley verbalizes understanding of the findings of todays visit and agrees with plan of treatment. I have discussed any further diagnostic evaluation that may be needed or ordered today. We also reviewed her medications today. she has been encouraged to call the office with any questions or concerns that should arise related to todays visit.   Orders Placed This Encounter  Procedures  . CT Abdomen Pelvis W Contrast    Meds ordered this encounter  Medications  . DISCONTD: ergocalciferol (DRISDOL) 1.25 MG (50000 UT) capsule    Sig: Take 1 capsule (50,000 Units total) by mouth once a week.    Dispense:  5 capsule    Refill:  0    Time spent: 25 Minutes  This patient was seen by Orson Gear AGNP-C in Collaboration with Dr [name] as a part of collaborative care agreement.  Kendell Bane AGNP-C Internal Medicine

## 2019-11-21 ENCOUNTER — Telehealth: Payer: Self-pay

## 2019-11-21 NOTE — Telephone Encounter (Signed)
Annie Paras sent this to me, but I think you ordered the CT. Sorry.

## 2019-11-25 ENCOUNTER — Encounter: Payer: Self-pay | Admitting: Nurse Practitioner

## 2019-11-25 NOTE — Telephone Encounter (Signed)
Can we go ahead and do this referral for her?

## 2019-12-04 ENCOUNTER — Telehealth: Payer: Self-pay | Admitting: Gastroenterology

## 2019-12-04 ENCOUNTER — Encounter: Payer: Self-pay | Admitting: Gastroenterology

## 2019-12-04 NOTE — Telephone Encounter (Signed)
Chart reviewed. Ok to set up OV with me. Thanks.

## 2019-12-04 NOTE — Telephone Encounter (Signed)
Dr. Bryan Lemma, this patient's father is your pt and requested for her to transfer GI care to you.  She has been experiencing abdominal pain and saw Dr. Allen Norris in 2020.  Records are in Epic for review.  Will you accept this pt?

## 2019-12-20 ENCOUNTER — Other Ambulatory Visit: Payer: Self-pay | Admitting: Adult Health

## 2019-12-20 DIAGNOSIS — E559 Vitamin D deficiency, unspecified: Secondary | ICD-10-CM

## 2020-01-01 ENCOUNTER — Encounter: Payer: Self-pay | Admitting: Gastroenterology

## 2020-01-01 ENCOUNTER — Ambulatory Visit (INDEPENDENT_AMBULATORY_CARE_PROVIDER_SITE_OTHER): Payer: Medicaid Other | Admitting: Gastroenterology

## 2020-01-01 ENCOUNTER — Other Ambulatory Visit: Payer: Self-pay

## 2020-01-01 VITALS — BP 126/84 | HR 68 | Temp 97.3°F | Ht 62.0 in | Wt 228.2 lb

## 2020-01-01 DIAGNOSIS — R1084 Generalized abdominal pain: Secondary | ICD-10-CM

## 2020-01-01 DIAGNOSIS — Z01818 Encounter for other preprocedural examination: Secondary | ICD-10-CM

## 2020-01-01 DIAGNOSIS — K5909 Other constipation: Secondary | ICD-10-CM

## 2020-01-01 DIAGNOSIS — R110 Nausea: Secondary | ICD-10-CM

## 2020-01-01 MED ORDER — CLENPIQ 10-3.5-12 MG-GM -GM/160ML PO SOLN: 1.0000 | Freq: Once | ORAL | 0 refills | Status: AC

## 2020-01-01 NOTE — Progress Notes (Signed)
Chief Complaint: Abdominal pain, nausea, constipation  Referring Provider:     [name], MD   HPI:    Shannon Foley is a 34 y.o. female referred to the [facility]astro[facility]ology Clinic for evaluation of abdominal pain, nausea, constipation.  Describes generalized abdominal pain since at least 2017, occurring intermittently. Present daily now, independent of timing, PO intake, activity, etc. Abdominal pain worse with constipation.    History of constipation since at least 2017 as well, and has been worked up as outlined below. Not currently taking any meds, fiber supplements, etc. Has previously trialed Linzess without response. Did not trial Amitiza. Trialed fiber supplement and Dulcolax without response. Has 1 BM/day with straining with pellet like stools. No hematochezia or melena. Weight gain. No f/c.  Episodic nausea without emesis, otherwise good p.o. intake. Feels like "pushing against a brick wall".   Previously followed by Dr. Allen Norris at [facility], last seen in 04/2019 for constipation and abdominal pain.  Did have some point abdominal pain with positive Carnett's sign off at exam, treated with Flexeril and NSAIDs along with compress.  Recommended increasing dietary fiber.  -CT abdomen/pelvis 12/2015: Questionable thickening of the duodenal wall, lobular uterus with small uterine fibroids, tiny hepatic cyst -EGD (03/2016): Normal -Repeat CT (03/2016): Normal.  Stable incidental hepatic cyst, small uterine fibroids -2017: Normal thyroid function studies -Trial of Linzess 72 mcg with diarrhea after first dose stopped -Trial of low-dose Amitiza -Abdominal x-ray (2017): Normal -Was seen by [facility] (Dr. Prescott Parma) in 05/2017: Diagnosed with IBS-C, recommended MiraLAX -UGI (6/19): Minimal hiatal hernia, spontaneous reflux -Then seen in the [facility] 05/2018 (Dr. Alice Reichert) and treated for IBS with low FODMAP diet, MiraLAX, OTC fiber supplement, physical activity/exercise and  labs, with consideration for TCA future -05/2018: Normal CBC, CMP, ESR, CRP, celiac panel -04/2019: Normal TSH, CMP, CBC (WBC 11.6). Vit D 17.1--> started Ergocalciferol   Past Medical History:  Diagnosis Date  . Back pain    mid and lower back  . Chronic abdominal pain   . Chronic constipation   . Fibroids    one submucosal and one intramural (both  < 1 inch in size)  . Headache    caused by pituitary cyst  . IBS (irritable bowel syndrome)   . Motion sickness cars  . Obesity   . Pituitary cyst ([place])    Rathke's pouch cyst     Past Surgical History:  Procedure Laterality Date  . CYSTOSCOPY  05/10/2016   WNL  . ESOPHAGOGASTRODUODENOSCOPY (EGD) WITH PROPOFOL N/A 02/18/2016   Procedure: ESOPHAGOGASTRODUODENOSCOPY (EGD) WITH PROPOFOL;  Surgeon: Lucilla Lame, MD;  Location: [facility];  Service: Endoscopy;  Laterality: N/A;  . TONSILLECTOMY  2017  . TONSILLECTOMY     Family History  Problem Relation Age of Onset  . Diabetes Mother   . Hypertension Mother   . Congestive Heart Failure Mother   . CVA Father        Had meningioma  . Diabetes Father   . Hypertension Father   . Bladder Cancer Paternal Aunt   . Colon cancer Neg Hx   . Esophageal cancer Neg Hx    Social History   Tobacco Use  . Smoking status: Never Smoker  . Smokeless tobacco: Never Used  Substance Use Topics  . Alcohol use: No  . Drug use: No   Current Outpatient Medications  Medication Sig Dispense Refill  . ibuprofen (ADVIL,MOTRIN) 800  MG tablet Take 1 tablet (800 mg total) by mouth every 8 (eight) hours as needed for moderate pain. 15 tablet 0  . Magnesium Hydroxide (DULCOLAX PO) Take 2 tablets by mouth as needed.    . Vitamin D, Ergocalciferol, (DRISDOL) 1.25 MG (50000 UNIT) CAPS capsule TAKE 1 CAPSULE BY MOUTH 1 TIME A WEEK 4 capsule 3   No current facility-administered medications for this visit.   No Known Allergies   Review of Systems: All systems reviewed and negative except where  noted in HPI.     Physical Exam:    Wt Readings from Last 3 Encounters:  01/01/20 228 lb 4 oz (103.5 kg)  11/19/19 223 lb (101.2 kg)  11/11/19 223 lb (101.2 kg)    BP 126/84   Pulse 68   Temp (!) 97.3 F (36.3 C)   Ht _0  (1.575 m)   Wt 228 lb 4 oz (103.5 kg)   BMI 41.75 kg/m  Constitutional:  Pleasant, in no acute distress. Psychiatric: Normal mood and affect. Behavior is normal. EENT: Pupils normal.  Conjunctivae are normal. No scleral icterus. Neck supple. No cervical LAD. Cardiovascular: Normal rate, regular rhythm. No edema Pulmonary/chest: Effort normal and breath sounds normal. No wheezing, rales or rhonchi. Abdominal: Mild generalized TTP without rebound or guarding.  No peritoneal signs.  No localizing pain.  Soft, nondistended. Bowel sounds active throughout. There are no masses palpable. No hepatomegaly. Neurological: Alert and oriented to person place and time. Skin: Skin is warm and dry. No rashes noted. Rectal: Exam deferred to time of colonoscopy.    ASSESSMENT AND PLAN;   1) Constipation 2) Generalized abdominal pain  Longstanding history of chronic constipation and generalized abdominal pain, which seems to be worsening and more bothersome lately.  Has had an extensive evaluation as outlined above, which has been largely unrevealing.  No previous colonoscopy.  Discussed chronic constipation and etiologies at length today, with plan as below:  -Colonoscopy to evaluate for mucosal/luminal pathology, stricture, luminal narrowing, etc. - Start Miralax daily after colo then titrate to effective dose - If colonoscopy unrevealing, plan for anorectal manometry to evaluate for Pelvic Floor Dyssynergia -Maintain adequate hydration with at least 64 ounces of water/day - No new labs or imaging needed at this time  3) Nausea without vomiting -Temporally related to times of abdominal pain and/or exacerbation of constipation.  Previous EGD was otherwise  unrevealing.   The indications, risks, and benefits of colonoscopy were explained to the patient in detail. Risks include but are not limited to bleeding, perforation, adverse reaction to medications, and cardiopulmonary compromise. Sequelae include but are not limited to the possibility of surgery, hospitalization, and mortality. The patient verbalized understanding and wished to proceed. All questions answered, referred to the scheduler and bowel prep ordered. Further recommendations pending results of the exam.    Lavena Bullion, DO, FACG  01/01/2020, 11:49 AM   [name], MD

## 2020-01-01 NOTE — Patient Instructions (Signed)
You have been scheduled for a colonoscopy. Please follow written instructions given to you at your visit today.  Please pick up your prep supplies at the pharmacy within the next 1-3 days. If you use inhalers (even only as needed), please bring them with you on the day of your procedure. Your physician has requested that you go to www.startemmi.com and enter the access code given to you at your visit today. This web site gives a general overview about your procedure. However, you should still follow specific instructions given to you by our office regarding your preparation for the procedure.  It was a pleasure to see you today!  Vito Cirigliano, D.O.  

## 2020-01-02 ENCOUNTER — Telehealth: Payer: Self-pay

## 2020-01-02 NOTE — Telephone Encounter (Signed)
Called patient and left voicemail for patient to cal back regarding clenpiq. Wants to see about if we can switch her to suprep or what would she like to do

## 2020-01-05 ENCOUNTER — Telehealth: Payer: Self-pay

## 2020-01-05 NOTE — Telephone Encounter (Signed)
Confirmed appointment on 01/06/2020 and screened for [email or web address] 

## 2020-01-06 NOTE — Telephone Encounter (Signed)
Patient's mother Ivin Booty returned your call. please call her back at [phone].

## 2020-01-06 NOTE — Telephone Encounter (Signed)
Spoke with patient's mom and they will pick up a sample at the [place] office tomorrow.

## 2020-01-07 ENCOUNTER — Other Ambulatory Visit: Payer: Self-pay

## 2020-01-07 ENCOUNTER — Ambulatory Visit: Payer: BC Managed Care – PPO | Admitting: Adult Health

## 2020-01-07 ENCOUNTER — Encounter: Payer: Self-pay | Admitting: Adult Health

## 2020-01-07 VITALS — BP 130/87 | HR 88 | Temp 97.6°F | Resp 16 | Ht 62.0 in | Wt 228.0 lb

## 2020-01-07 DIAGNOSIS — K581 Irritable bowel syndrome with constipation: Secondary | ICD-10-CM | POA: Diagnosis not present

## 2020-01-07 DIAGNOSIS — K219 Gastro-esophageal reflux disease without esophagitis: Secondary | ICD-10-CM

## 2020-01-07 DIAGNOSIS — E559 Vitamin D deficiency, unspecified: Secondary | ICD-10-CM | POA: Diagnosis not present

## 2020-01-07 DIAGNOSIS — R1013 Epigastric pain: Secondary | ICD-10-CM | POA: Diagnosis not present

## 2020-01-07 NOTE — Progress Notes (Signed)
[facility] [place], [place] [address]  Internal MEDICINE  Office Visit Note  Patient Name: Shannon Foley  [id number]  [id number]  Date of Service: 01/07/2020  Chief Complaint  Patient presents with  . Follow-up    HPI  Pt is here for follow up.  She was last seen on 3/3.  She was complaining of ongoing issues with IBS constipation.  A Ct was ordered, but denies by insurance.  She has seen GI, and they are planning a colonoscopy may 14th. She continues to have episodes of constipation.  She has been taking Miralax per GI recommendations, and drinking water.  She continues to take Vit D supplement for her low level.  Denies any new or worsening issues.     Current Medication: Outpatient Encounter Medications as of 01/07/2020  Medication Sig  . ibuprofen (ADVIL,MOTRIN) 800 MG tablet Take 1 tablet (800 mg total) by mouth every 8 (eight) hours as needed for moderate pain.  . Vitamin D, Ergocalciferol, (DRISDOL) 1.25 MG (50000 UNIT) CAPS capsule TAKE 1 CAPSULE BY MOUTH 1 TIME A WEEK  . Magnesium Hydroxide (DULCOLAX PO) Take 2 tablets by mouth as needed.   No facility-administered encounter medications on file as of 01/07/2020.    Surgical History: Past Surgical History:  Procedure Laterality Date  . CYSTOSCOPY  05/10/2016   WNL  . ESOPHAGOGASTRODUODENOSCOPY (EGD) WITH PROPOFOL N/A 02/18/2016   Procedure: ESOPHAGOGASTRODUODENOSCOPY (EGD) WITH PROPOFOL;  Surgeon: Lucilla Lame, MD;  Location: [place];  Service: Endoscopy;  Laterality: N/A;  . TONSILLECTOMY  2017  . TONSILLECTOMY      Medical History: Past Medical History:  Diagnosis Date  . Back pain    mid and lower back  . Chronic abdominal pain   . Chronic constipation   . Fibroids    one submucosal and one intramural (both  < 1 inch in size)  . Headache    caused by pituitary cyst  . IBS (irritable bowel syndrome)   . Motion sickness cars  . Obesity   . Pituitary cyst (HCC)     Rathke's pouch cyst    Family History: Family History  Problem Relation Age of Onset  . Diabetes Mother   . Hypertension Mother   . Congestive Heart Failure Mother   . CVA Father        Had meningioma  . Diabetes Father   . Hypertension Father   . Bladder Cancer Paternal Aunt   . Colon cancer Neg Hx   . Esophageal cancer Neg Hx     Social History   Socioeconomic History  . Marital status: Single    Spouse name: Not on file  . Number of children: 0  . Years of education: 76  . Highest education level: Not on file  Occupational History  . Occupation: Chemical engineer  Tobacco Use  . Smoking status: Never Smoker  . Smokeless tobacco: Never Used  Substance and Sexual Activity  . Alcohol use: No  . Drug use: No  . Sexual activity: Yes    Birth control/protection: None  Other Topics Concern  . Not on file  Social History Narrative  . Not on file   Social Determinants of Health   Financial Resource Strain:   . Difficulty of Paying Living Expenses:   Food Insecurity:   . Worried About Charity fundraiser in the Last Year:   . Arboriculturist in the Last Year:   Transportation Needs:   .  Lack of Transportation (Medical):   Marland Kitchen Lack of Transportation (Non-Medical):   Physical Activity:   . Days of Exercise per Week:   . Minutes of Exercise per Session:   Stress:   . Feeling of Stress :   Social Connections:   . Frequency of Communication with Friends and Family:   . Frequency of Social Gatherings with Friends and Family:   . Attends Religious Services:   . Active Member of Clubs or Organizations:   . Attends Archivist Meetings:   Marland Kitchen Marital Status:   Intimate Partner Violence:   . Fear of Current or Ex-Partner:   . Emotionally Abused:   Marland Kitchen Physically Abused:   . Sexually Abused:       Review of Systems  Constitutional: Negative for chills, fatigue and unexpected weight change.  HENT: Negative for congestion, rhinorrhea, sneezing and sore throat.    Eyes: Negative for photophobia, pain and redness.  Respiratory: Negative for cough, chest tightness and shortness of breath.   Cardiovascular: Negative for chest pain and palpitations.  Gastrointestinal: Positive for abdominal pain and constipation. Negative for diarrhea, nausea and vomiting.  Endocrine: Negative.   Genitourinary: Negative for dysuria and frequency.  Musculoskeletal: Negative for arthralgias, back pain, joint swelling and neck pain.  Skin: Negative for rash.  Allergic/Immunologic: Negative.   Neurological: Negative for tremors and numbness.  Hematological: Negative for adenopathy. Does not bruise/bleed easily.  Psychiatric/Behavioral: Negative for behavioral problems and sleep disturbance. The patient is not nervous/anxious.     Vital Signs: BP 130/87   Pulse 88   Temp 97.6 F (36.4 C)   Resp 16   Ht 5\' 2"  (1.575 m)   Wt 228 lb (103.4 kg)   SpO2 97%   BMI 41.70 kg/m    Physical Exam Vitals and nursing note reviewed.  Constitutional:      General: She is not in acute distress.    Appearance: She is well-developed. She is not diaphoretic.  HENT:     Head: Normocephalic and atraumatic.     Mouth/Throat:     Pharynx: No oropharyngeal exudate.  Eyes:     Pupils: Pupils are equal, round, and reactive to light.  Neck:     Thyroid: No thyromegaly.     Vascular: No JVD.     Trachea: No tracheal deviation.  Cardiovascular:     Rate and Rhythm: Normal rate and regular rhythm.     Heart sounds: Normal heart sounds. No murmur. No friction rub. No gallop.   Pulmonary:     Effort: Pulmonary effort is normal. No respiratory distress.     Breath sounds: Normal breath sounds. No wheezing or rales.  Chest:     Chest wall: No tenderness.  Abdominal:     Palpations: Abdomen is soft.     Tenderness: There is no abdominal tenderness. There is no guarding.  Musculoskeletal:        General: Normal range of motion.     Cervical back: Normal range of motion and neck  supple.  Lymphadenopathy:     Cervical: No cervical adenopathy.  Skin:    General: Skin is warm and dry.  Neurological:     Mental Status: She is alert and oriented to person, place, and time.     Cranial Nerves: No cranial nerve deficit.  Psychiatric:        Behavior: Behavior normal.        Thought Content: Thought content normal.  Judgment: Judgment normal.    Assessment/Plan: 1. Irritable bowel syndrome with constipation Patient failed Linzess, continue to see GI, colonoscopy scheduled for 01/30/20, continue to follow GI recommendations to increase fiber and fluid intake, and continue miralax.   2. Epigastric abdominal pain Unchanged, continue to follow gi recommendations.   3. Gastroesophageal reflux disease without esophagitis Continue to monitor.  Pt not currently taking any medications.   4. Vitamin D deficiency Continue Vit D as prescribed.   General Counseling: Chaela verbalizes understanding of the findings of todays visit and agrees with plan of treatment. I have discussed any further diagnostic evaluation that may be needed or ordered today. We also reviewed her medications today. she has been encouraged to call the office with any questions or concerns that should arise related to todays visit.    No orders of the defined types were placed in this encounter.   No orders of the defined types were placed in this encounter.   Time spent: 30 Minutes   This patient was seen by Orson Gear AGNP-C in Collaboration with Dr [name] as a part of collaborative care agreement     Kendell Bane AGNP-C Internal medicine

## 2020-01-16 ENCOUNTER — Encounter: Payer: Self-pay | Admitting: Gastroenterology

## 2020-01-28 ENCOUNTER — Other Ambulatory Visit: Payer: Self-pay | Admitting: Gastroenterology

## 2020-01-28 ENCOUNTER — Ambulatory Visit (INDEPENDENT_AMBULATORY_CARE_PROVIDER_SITE_OTHER): Payer: Self-pay

## 2020-01-28 DIAGNOSIS — Z1159 Encounter for screening for other viral diseases: Secondary | ICD-10-CM

## 2020-01-28 LAB — SARS CORONAVIRUS 2 (TAT 6-24 HRS): SARS Coronavirus 2: NEGATIVE

## 2020-01-30 ENCOUNTER — Encounter: Payer: Self-pay | Admitting: Gastroenterology

## 2020-01-30 ENCOUNTER — Other Ambulatory Visit: Payer: Self-pay

## 2020-01-30 ENCOUNTER — Ambulatory Visit (AMBULATORY_SURGERY_CENTER): Payer: BC Managed Care – PPO | Admitting: Gastroenterology

## 2020-01-30 VITALS — BP 124/81 | HR 78 | Temp 97.5°F | Resp 20 | Ht 62.0 in | Wt 228.0 lb

## 2020-01-30 DIAGNOSIS — R195 Other fecal abnormalities: Secondary | ICD-10-CM | POA: Diagnosis not present

## 2020-01-30 DIAGNOSIS — K589 Irritable bowel syndrome without diarrhea: Secondary | ICD-10-CM | POA: Diagnosis not present

## 2020-01-30 DIAGNOSIS — K5909 Other constipation: Secondary | ICD-10-CM

## 2020-01-30 MED ORDER — SODIUM CHLORIDE 0.9 % IV SOLN: 500.0000 mL | Freq: Once | INTRAVENOUS | Status: DC

## 2020-01-30 NOTE — Progress Notes (Signed)
Report to PACU, RN, vss, BBS= Clear.  

## 2020-01-30 NOTE — Op Note (Addendum)
[place] Patient Name: Shannon Foley Procedure Date: 01/30/2020 10:07 AM MRN: [id number] Endoscopist: Gerrit Heck , MD Age: 34 Referring MD:  Date of Birth: 1986-06-18 Gender: Female Account #: [id number] Procedure:                Colonoscopy Indications:              Generalized abdominal pain, Change in bowel habits,                            Constipation Medicines:                Monitored Anesthesia Care Procedure:                Pre-Anesthesia Assessment:                           - Prior to the procedure, a History and Physical                            was performed, and patient medications and                            allergies were reviewed. The patient's tolerance of                            previous anesthesia was also reviewed. The risks                            and benefits of the procedure and the sedation                            options and risks were discussed with the patient.                            All questions were answered, and informed consent                            was obtained. Prior Anticoagulants: The patient has                            taken no previous anticoagulant or antiplatelet                            agents. ASA Grade Assessment: II - A patient with                            mild systemic disease. After reviewing the risks                            and benefits, the patient was deemed in                            satisfactory condition to undergo the procedure.  After obtaining informed consent, the colonoscope                            was passed under direct vision. Throughout the                            procedure, the patient's blood pressure, pulse, and                            oxygen saturations were monitored continuously. The                            Colonoscope was introduced through the anus and                            advanced to the the terminal ileum. The  terminal                            ileum, ileocecal valve, appendiceal orifice, and                            rectum were photographed. Scope In: 10:17:51 AM Scope Out: 10:29:02 AM Scope Withdrawal Time: 0 hours 8 minutes 44 seconds  Total Procedure Duration: 0 hours 11 minutes 11 seconds  Findings:                 The perianal and digital rectal examinations were                            normal.                           The colon appeared normal throughout.                           The terminal ileum appeared normal.                           The retroflexed view of the distal rectum and anal                            verge was normal and showed no anal or rectal                            abnormalities. Complications:            No immediate complications. Estimated Blood Loss:     Estimated blood loss: none. Impression:               - The entire examined colon is normal.                           - The examined portion of the ileum was normal.                           - The distal rectum and anal verge are normal on  retroflexion view.                           - No specimens collected. Recommendation:           - Patient has a contact number available for                            emergencies. The signs and symptoms of potential                            delayed complications were discussed with the                            patient. Return to normal activities tomorrow.                            Written discharge instructions were provided to the                            patient.                           - Resume previous diet.                           - Continue present medications.                           - Perform anorectal manometry at appointment to be                            scheduled. Gerrit Heck, MD 01/30/2020 10:33:30 AM

## 2020-01-30 NOTE — Patient Instructions (Signed)
YOU HAD AN ENDOSCOPIC PROCEDURE TODAY AT [facility]:   Refer to the procedure report that was given to you for any specific questions about what was found during the examination.  If the procedure report does not answer your questions, please call your gastroenterologist to clarify.  If you requested that your care partner not be given the details of your procedure findings, then the procedure report has been included in a sealed envelope for you to review at your convenience later.  YOU SHOULD EXPECT: Some feelings of bloating in the abdomen. Passage of more gas than usual.  Walking can help get rid of the air that was put into your GI tract during the procedure and reduce the bloating. If you had a lower endoscopy (such as a colonoscopy or flexible sigmoidoscopy) you may notice spotting of blood in your stool or on the toilet paper. If you underwent a bowel prep for your procedure, you may not have a normal bowel movement for a few days.  Please Note:  You might notice some irritation and congestion in your nose or some drainage.  This is from the oxygen used during your procedure.  There is no need for concern and it should clear up in a day or so.  SYMPTOMS TO REPORT IMMEDIATELY:   Following lower endoscopy (colonoscopy or flexible sigmoidoscopy):  Excessive amounts of blood in the stool  Significant tenderness or worsening of abdominal pains  Swelling of the abdomen that is new, acute  Fever of 100F or higher    For urgent or emergent issues, a gastroenterologist can be reached at any hour by calling [phone]. Do not use MyChart messaging for urgent concerns.    DIET:  We do recommend a small meal at first, but then you may proceed to your regular diet.  Drink plenty of fluids but you should avoid alcoholic beverages for 24 hours.  ACTIVITY:  You should plan to take it easy for the rest of today and you should NOT DRIVE or use heavy machinery until tomorrow  (because of the sedation medicines used during the test).    FOLLOW UP: Our staff will call the number listed on your records 48-72 hours following your procedure to check on you and address any questions or concerns that you may have regarding the information given to you following your procedure. If we do not reach you, we will leave a message.  We will attempt to reach you two times.  During this call, we will ask if you have developed any symptoms of COVID 19. If you develop any symptoms (ie: fever, flu-like symptoms, shortness of breath, cough etc.) before then, please call [phone].  If you test positive for Covid 19 in the 2 weeks post procedure, please call and report this information to [place].    If any biopsies were taken you will be contacted by phone or by letter within the next 1-3 weeks.  Please call us at [phone] if you have not heard about the biopsies in 3 weeks.    SIGNATURES/CONFIDENTIALITY: You and/or your care partner have signed paperwork which will be entered into your electronic medical record.  These signatures attest to the fact that that the information above on your After Visit Summary has been reviewed and is understood.  Full responsibility of the confidentiality of this discharge information lies with you and/or your care-partner.   Resume medications. See report for recommendations.

## 2020-02-02 ENCOUNTER — Other Ambulatory Visit: Payer: Self-pay | Admitting: Gastroenterology

## 2020-02-02 DIAGNOSIS — K5909 Other constipation: Secondary | ICD-10-CM

## 2020-02-02 DIAGNOSIS — R1084 Generalized abdominal pain: Secondary | ICD-10-CM

## 2020-02-03 ENCOUNTER — Telehealth: Payer: Self-pay

## 2020-02-03 NOTE — Telephone Encounter (Signed)
2nd follow up call made.  NALM 

## 2020-02-03 NOTE — Telephone Encounter (Signed)
1st follow up call made.  NALM 

## 2020-02-10 ENCOUNTER — Other Ambulatory Visit: Payer: Self-pay

## 2020-02-10 ENCOUNTER — Encounter: Payer: Self-pay | Admitting: Obstetrics and Gynecology

## 2020-02-10 ENCOUNTER — Encounter: Payer: Medicare Other | Admitting: Obstetrics and Gynecology

## 2020-02-10 NOTE — Patient Instructions (Signed)
I value your feedback and entrusting us with your care. If you get a [facility] patient survey, I would appreciate you taking the time to let us know about your experience today. Thank you!  As of August 28, 2019, your lab results will be released to your MyChart immediately, before I even have a chance to see them. Please give me time to review them and contact you if there are any abnormalities. Thank you for your patience.  

## 2020-02-10 NOTE — Progress Notes (Signed)
This encounter was created in error - please disregard.

## 2020-02-11 ENCOUNTER — Encounter: Payer: Self-pay | Admitting: Obstetrics and Gynecology

## 2020-02-11 ENCOUNTER — Other Ambulatory Visit (HOSPITAL_COMMUNITY)
Admission: RE | Admit: 2020-02-11 | Discharge: 2020-02-11 | Disposition: A | Discharge status: Home / Self Care | Payer: BC Managed Care – PPO | Source: Ambulatory Visit | Attending: Obstetrics and Gynecology | Admitting: Obstetrics and Gynecology

## 2020-02-11 ENCOUNTER — Ambulatory Visit (INDEPENDENT_AMBULATORY_CARE_PROVIDER_SITE_OTHER): Payer: BC Managed Care – PPO | Admitting: Obstetrics and Gynecology

## 2020-02-11 ENCOUNTER — Other Ambulatory Visit: Payer: Self-pay

## 2020-02-11 VITALS — BP 116/70 | Ht 63.0 in | Wt 227.6 lb

## 2020-02-11 DIAGNOSIS — Z124 Encounter for screening for malignant neoplasm of cervix: Secondary | ICD-10-CM | POA: Insufficient documentation

## 2020-02-11 DIAGNOSIS — Z113 Encounter for screening for infections with a predominantly sexual mode of transmission: Secondary | ICD-10-CM | POA: Diagnosis not present

## 2020-02-11 DIAGNOSIS — Z3A01 Less than 8 weeks gestation of pregnancy: Secondary | ICD-10-CM | POA: Diagnosis not present

## 2020-02-11 DIAGNOSIS — Z131 Encounter for screening for diabetes mellitus: Secondary | ICD-10-CM

## 2020-02-11 DIAGNOSIS — O09521 Supervision of elderly multigravida, first trimester: Secondary | ICD-10-CM

## 2020-02-11 DIAGNOSIS — R300 Dysuria: Secondary | ICD-10-CM

## 2020-02-11 DIAGNOSIS — O99213 Obesity complicating pregnancy, third trimester: Secondary | ICD-10-CM

## 2020-02-11 DIAGNOSIS — N912 Amenorrhea, unspecified: Secondary | ICD-10-CM

## 2020-02-11 DIAGNOSIS — Z3401 Encounter for supervision of normal first pregnancy, first trimester: Secondary | ICD-10-CM | POA: Diagnosis not present

## 2020-02-11 DIAGNOSIS — O99211 Obesity complicating pregnancy, first trimester: Secondary | ICD-10-CM

## 2020-02-11 DIAGNOSIS — O26891 Other specified pregnancy related conditions, first trimester: Secondary | ICD-10-CM

## 2020-02-11 LAB — POCT URINALYSIS DIPSTICK OB
Glucose, UA: NEGATIVE
POC,PROTEIN,UA: NEGATIVE

## 2020-02-11 LAB — POCT URINE PREGNANCY: Preg Test, Ur: POSITIVE — AB

## 2020-02-11 NOTE — Patient Instructions (Signed)
First Trimester of Pregnancy The first trimester of pregnancy is from week 1 until the end of week 13 (months 1 through 3). A week after a sperm fertilizes an egg, the egg will implant on the wall of the uterus. This embryo will begin to develop into a baby. Genes from you and your partner will form the baby. The female genes will determine whether the baby will be a boy or a girl. At 6-8 weeks, the eyes and face will be formed, and the heartbeat can be seen on ultrasound. At the end of 12 weeks, all the baby's organs will be formed. Now that you are pregnant, you will want to do everything you can to have a healthy baby. Two of the most important things are to get good prenatal care and to follow your health care provider's instructions. Prenatal care is all the medical care you receive before the baby's birth. This care will help prevent, find, and treat any problems during the pregnancy and childbirth. Body changes during your first trimester Your body goes through many changes during pregnancy. The changes vary from woman to woman.  You may gain or lose a couple of pounds at first.  You may feel sick to your stomach (nauseous) and you may throw up (vomit). If the vomiting is uncontrollable, call your health care provider.  You may tire easily.  You may develop headaches that can be relieved by medicines. All medicines should be approved by your health care provider.  You may urinate more often. Painful urination may mean you have a bladder infection.  You may develop heartburn as a result of your pregnancy.  You may develop constipation because certain hormones are causing the muscles that push stool through your intestines to slow down.  You may develop hemorrhoids or swollen veins (varicose veins).  Your breasts may begin to grow larger and become tender. Your nipples may stick out more, and the tissue that surrounds them (areola) may become darker.  Your gums may bleed and may be  sensitive to brushing and flossing.  Dark spots or blotches (chloasma, mask of pregnancy) may develop on your face. This will likely fade after the baby is born.  Your menstrual periods will stop.  You may have a loss of appetite.  You may develop cravings for certain kinds of food.  You may have changes in your emotions from day to day, such as being excited to be pregnant or being concerned that something may go wrong with the pregnancy and baby.  You may have more vivid and strange dreams.  You may have changes in your hair. These can include thickening of your hair, rapid growth, and changes in texture. Some women also have hair loss during or after pregnancy, or hair that feels dry or thin. Your hair will most likely return to normal after your baby is born. What to expect at prenatal visits During a routine prenatal visit:  You will be weighed to make sure you and the baby are growing normally.  Your blood pressure will be taken.  Your abdomen will be measured to track your baby's growth.  The fetal heartbeat will be listened to between weeks 10 and 14 of your pregnancy.  Test results from any previous visits will be discussed. Your health care provider may ask you:  How you are feeling.  If you are feeling the baby move.  If you have had any abnormal symptoms, such as leaking fluid, bleeding, severe headaches, or abdominal   cramping.  If you are using any tobacco products, including cigarettes, chewing tobacco, and electronic cigarettes.  If you have any questions. Other tests that may be performed during your first trimester include:  Blood tests to find your blood type and to check for the presence of any previous infections. The tests will also be used to check for low iron levels (anemia) and protein on red blood cells (Rh antibodies). Depending on your risk factors, or if you previously had diabetes during pregnancy, you may have tests to check for high blood sugar  that affects pregnant women (gestational diabetes).  Urine tests to check for infections, diabetes, or protein in the urine.  An ultrasound to confirm the proper growth and development of the baby.  Fetal screens for spinal cord problems (spina bifida) and Down syndrome.  HIV (human immunodeficiency virus) testing. Routine prenatal testing includes screening for HIV, unless you choose not to have this test.  You may need other tests to make sure you and the baby are doing well. Follow these instructions at home: Medicines  Follow your health care provider's instructions regarding medicine use. Specific medicines may be either safe or unsafe to take during pregnancy.  Take a prenatal vitamin that contains at least 600 micrograms (mcg) of folic acid.  If you develop constipation, try taking a stool softener if your health care provider approves. Eating and drinking   Eat a balanced diet that includes fresh fruits and vegetables, whole grains, good sources of protein such as meat, eggs, or tofu, and low-fat dairy. Your health care provider will help you determine the amount of weight gain that is right for you.  Avoid raw meat and uncooked cheese. These carry germs that can cause birth defects in the baby.  Eating four or five small meals rather than three large meals a day may help relieve nausea and vomiting. If you start to feel nauseous, eating a few soda crackers can be helpful. Drinking liquids between meals, instead of during meals, also seems to help ease nausea and vomiting.  Limit foods that are high in fat and processed sugars, such as fried and sweet foods.  To prevent constipation: ? Eat foods that are high in fiber, such as fresh fruits and vegetables, whole grains, and beans. ? Drink enough fluid to keep your urine clear or pale yellow. Activity  Exercise only as directed by your health care provider. Most women can continue their usual exercise routine during  pregnancy. Try to exercise for 30 minutes at least 5 days a week. Exercising will help you: ? Control your weight. ? Stay in shape. ? Be prepared for labor and delivery.  Experiencing pain or cramping in the lower abdomen or lower back is a good sign that you should stop exercising. Check with your health care provider before continuing with normal exercises.  Try to avoid standing for long periods of time. Move your legs often if you must stand in one place for a long time.  Avoid heavy lifting.  Wear low-heeled shoes and practice good posture.  You may continue to have sex unless your health care provider tells you not to. Relieving pain and discomfort  Wear a good support bra to relieve breast tenderness.  Take warm sitz baths to soothe any pain or discomfort caused by hemorrhoids. Use hemorrhoid cream if your health care provider approves.  Rest with your legs elevated if you have leg cramps or low back pain.  If you develop varicose veins in   your legs, wear support hose. Elevate your feet for 15 minutes, 3-4 times a day. Limit salt in your diet. Prenatal care  Schedule your prenatal visits by the twelfth week of pregnancy. They are usually scheduled monthly at first, then more often in the last 2 months before delivery.  Write down your questions. Take them to your prenatal visits.  Keep all your prenatal visits as told by your health care provider. This is important. Safety  Wear your seat belt at all times when driving.  Make a list of emergency phone numbers, including numbers for family, friends, the hospital, and police and fire departments. General instructions  Ask your health care provider for a referral to a local prenatal education class. Begin classes no later than the beginning of month 6 of your pregnancy.  Ask for help if you have counseling or nutritional needs during pregnancy. Your health care provider can offer advice or refer you to specialists for help  with various needs.  Do not use hot tubs, steam rooms, or saunas.  Do not douche or use tampons or scented sanitary pads.  Do not cross your legs for long periods of time.  Avoid cat litter boxes and soil used by cats. These carry germs that can cause birth defects in the baby and possibly loss of the fetus by miscarriage or stillbirth.  Avoid all smoking, herbs, alcohol, and medicines not prescribed by your health care provider. Chemicals in these products affect the formation and growth of the baby.  Do not use any products that contain nicotine or tobacco, such as cigarettes and e-cigarettes. If you need help quitting, ask your health care provider. You may receive counseling support and other resources to help you quit.  Schedule a dentist appointment. At home, brush your teeth with a soft toothbrush and be gentle when you floss. Contact a health care provider if:  You have dizziness.  You have mild pelvic cramps, pelvic pressure, or nagging pain in the abdominal area.  You have persistent nausea, vomiting, or diarrhea.  You have a bad smelling vaginal discharge.  You have pain when you urinate.  You notice increased swelling in your face, hands, legs, or ankles.  You are exposed to fifth disease or chickenpox.  You are exposed to German measles (rubella) and have never had it. Get help right away if:  You have a fever.  You are leaking fluid from your vagina.  You have spotting or bleeding from your vagina.  You have severe abdominal cramping or pain.  You have rapid weight gain or loss.  You vomit blood or material that looks like coffee grounds.  You develop a severe headache.  You have shortness of breath.  You have any kind of trauma, such as from a fall or a car accident. Summary  The first trimester of pregnancy is from week 1 until the end of week 13 (months 1 through 3).  Your body goes through many changes during pregnancy. The changes vary from  woman to woman.  You will have routine prenatal visits. During those visits, your health care provider will examine you, discuss any test results you may have, and talk with you about how you are feeling. This information is not intended to replace advice given to you by your health care provider. Make sure you discuss any questions you have with your health care provider. Document Revised: 08/17/2017 Document Reviewed: 08/16/2016 Elsevier Patient Education  2020 Elsevier Inc.  

## 2020-02-11 NOTE — Progress Notes (Signed)
02/11/2020   Chief Complaint: Missed period  Transfer of Care Patient: no  History of Present Illness: Shannon Foley is a 34 y.o. G1P0000 [redacted]w[redacted]d based on Patient's last menstrual period was 01/11/2020. with an Estimated Date of Delivery: 10/17/20, with the above CC.   Her periods were: regular periods every month She was using no method when she conceived.  She has Negative signs or symptoms of nausea/vomiting of pregnancy. She has Negative signs or symptoms of miscarriage or preterm labor She was not taking different medications around the time she conceived/early pregnancy. Since her LMP, she has not used alcohol Since her LMP, she has not used tobacco products Since her LMP, she has not used illegal drugs.    Current or past history of domestic violence. no  Infection History:  1. Since her LMP, she has not had a viral illness.  2. She denies close contact with children on a regular basis     3. She denies a history of chicken pox. She reports vaccination for chicken pox in the past. 4. Patient or partner has history of genital herpes  no 5. History of STI (GC, CT, HPV, syphilis, HIV)  no   6.  She does not live with someone with TB or TB exposed. 7. History of recent travel :  no 8. She identifies Negative Zika risk factors for her and her partner 31. There are not cats in the home in the home.  She understands that while pregnant she should not change cat litter.   Genetic Screening Questions: (Includes patient, baby's father, or anyone in either family)   1. Patient's age >/= 59 at [facility]  no 2. Thalassemia ([place], [place], [place], or Asian background): MCV<80  no 3. Neural tube defect (meningomyelocele, spina bifida, anencephaly)  no 4. Congenital heart defect  no  5. Down syndrome  no 6. Tay-Sachs (Jewish, [place])  no 7. Canavan's Disease  no 8. Sickle cell disease or trait (African)  no  9. Hemophilia or other blood disorders  no  10. Muscular dystrophy  no    11. Cystic fibrosis  no  12. Huntington's Chorea  no  13. Mental retardation/autism  Yes- patient's brother has autism. 14. Other inherited genetic or chromosomal disorder  no 15. Maternal metabolic disorder (DM, PKU, etc)  no 16. Patient or FOB with a child with a birth defect not listed above no  16a. Patient or FOB with a birth defect themselves no 17. Recurrent pregnancy loss, or stillbirth  no  18. Any medications since LMP other than prenatal vitamins (include vitamins, supplements, OTC meds, drugs, alcohol)  no 19. Any other genetic/environmental exposure to discuss  no  ROS:  Review of Systems  Constitutional: Negative for chills, fever, malaise/fatigue and weight loss.  HENT: Negative for congestion, hearing loss and sinus pain.   Eyes: Negative for blurred vision and double vision.  Respiratory: Negative for cough, sputum production, shortness of breath and wheezing.   Cardiovascular: Negative for chest pain, palpitations, orthopnea and leg swelling.  Gastrointestinal: Negative for abdominal pain, constipation, diarrhea, nausea and vomiting.  Genitourinary: Negative for dysuria, flank pain, frequency, hematuria and urgency.  Musculoskeletal: Negative for back pain, falls and joint pain.  Skin: Negative for itching and rash.  Neurological: Negative for dizziness and headaches.  Psychiatric/Behavioral: Negative for depression, substance abuse and suicidal ideas. The patient is not nervous/anxious.     OBGYN History: As per HPI. OB History  Gravida Para Term Preterm AB Living  1  0 0 0 0 0  SAB TAB Ectopic Multiple Live Births  0 0 0 0 0    # Outcome Date GA Lbr Len/2nd Weight Sex Delivery Anes PTL Lv  1 Current             Any issues with any prior pregnancies: not applicable Any prior children are healthy, doing well, without any problems or issues: not applicable Last pap smear CIN 1: 2020 History of STIs: No   Past Medical History: Past Medical History:   Diagnosis Date  . Back pain    mid and lower back  . Chronic abdominal pain   . Chronic constipation   . Fibroids    one submucosal and one intramural (both  < 1 inch in size)  . Headache    caused by pituitary cyst  . IBS (irritable bowel syndrome)   . Motion sickness cars  . Obesity   . Pituitary cyst ([place])    Rathke's pouch cyst    Past Surgical History: Past Surgical History:  Procedure Laterality Date  . CYSTOSCOPY  05/10/2016   WNL  . ESOPHAGOGASTRODUODENOSCOPY (EGD) WITH PROPOFOL N/A 02/18/2016   Procedure: ESOPHAGOGASTRODUODENOSCOPY (EGD) WITH PROPOFOL;  Surgeon: Lucilla Lame, MD;  Location: [facility];  Service: Endoscopy;  Laterality: N/A;  . TONSILLECTOMY  2017  . TONSILLECTOMY      Family History:  Family History  Problem Relation Age of Onset  . Diabetes Mother   . Hypertension Mother   . Congestive Heart Failure Mother   . CVA Father        Had meningioma  . Diabetes Father   . Hypertension Father   . Bladder Cancer Paternal Aunt   . Colon cancer Neg Hx   . Esophageal cancer Neg Hx   . Stomach cancer Neg Hx   . Rectal cancer Neg Hx    She denies any female cancers, bleeding or blood clotting disorders.   Social History:  Social History   Socioeconomic History  . Marital status: Single    Spouse name: Not on file  . Number of children: 0  . Years of education: 32  . Highest education level: Not on file  Occupational History  . Occupation: Chemical engineer  Tobacco Use  . Smoking status: Never Smoker  . Smokeless tobacco: Never Used  Substance and Sexual Activity  . Alcohol use: No  . Drug use: No  . Sexual activity: Yes    Birth control/protection: None  Other Topics Concern  . Not on file  Social History Narrative  . Not on file   Social Determinants of Health   Financial Resource Strain:   . Difficulty of Paying Living Expenses:   Food Insecurity:   . Worried About Charity fundraiser in the Last Year:   . Academic librarian in the Last Year:   Transportation Needs:   . Film/video editor (Medical):   Marland Kitchen Lack of Transportation (Non-Medical):   Physical Activity:   . Days of Exercise per Week:   . Minutes of Exercise per Session:   Stress:   . Feeling of Stress :   Social Connections:   . Frequency of Communication with Friends and Family:   . Frequency of Social Gatherings with Friends and Family:   . Attends Religious Services:   . Active Member of Clubs or Organizations:   . Attends Archivist Meetings:   Marland Kitchen Marital Status:   Intimate Partner Violence:   .  Fear of Current or Ex-Partner:   . Emotionally Abused:   Marland Kitchen Physically Abused:   . Sexually Abused:     Allergy: No Known Allergies  Current Outpatient Medications: No current outpatient medications on file.   Physical Exam: Physical Exam  Constitutional: She is oriented to person, place, and time and well-developed, well-nourished, and in no distress.  HENT:  Head: Normocephalic and atraumatic.  Eyes: Pupils are equal, round, and reactive to light.  Neck: No thyromegaly present.  Cardiovascular: Normal rate and regular rhythm.  Pulmonary/Chest: Effort normal.  Abdominal: Soft. Bowel sounds are normal. She exhibits no distension. There is no abdominal tenderness. There is no rebound and no guarding.  Musculoskeletal:        General: Normal range of motion.     Cervical back: Normal range of motion and neck supple.  Neurological: She is alert and oriented to person, place, and time.  Skin: Skin is warm and dry.  Psychiatric: Affect and judgment normal.  Nursing note and vitals reviewed.    Assessment: Shannon Foley is a 34 y.o. G1P0000 [redacted]w[redacted]d based on Patient's last menstrual period was 01/11/2020. with an Estimated Date of Delivery: 10/17/20,  for prenatal care.  Plan:  1) Avoid alcoholic beverages. 2) Patient encouraged not to smoke.  3) Discontinue the use of all non-medicinal drugs and chemicals.  4) Take prenatal  vitamins daily.  5) Seatbelt use advised 6) Nutrition, food safety (fish, cheese advisories, and high nitrite foods) and exercise discussed. 7) Hospital and practice style delivering at [facility] discussed  8) Patient is asked about travel to areas at risk for the Foster virus, and counseled to avoid travel and exposure to mosquitoes or sexual partners who may have themselves been exposed to the virus. Testing is discussed, and will be ordered as appropriate.  9) Childbirth classes at [facility] advised 10) Genetic Screening, such as with 1st Trimester Screening, cell free fetal DNA, AFP testing, and Ultrasound, as well as with amniocentesis and CVS as appropriate, is discussed with patient. She plans to have genetic testing this pregnancy.    Problem list reviewed and updated.  I discussed the assessment and treatment plan with the patient. The patient was provided an opportunity to ask questions and all were answered. The patient agreed with the plan and demonstrated an understanding of the instructions.  Adrian Prows MD [facility] 02/11/2020 3:06 PM

## 2020-02-12 DIAGNOSIS — O09521 Supervision of elderly multigravida, first trimester: Secondary | ICD-10-CM | POA: Insufficient documentation

## 2020-02-12 DIAGNOSIS — O99211 Obesity complicating pregnancy, first trimester: Secondary | ICD-10-CM | POA: Insufficient documentation

## 2020-02-12 DIAGNOSIS — O0990 Supervision of high risk pregnancy, unspecified, unspecified trimester: Secondary | ICD-10-CM | POA: Insufficient documentation

## 2020-02-12 LAB — RPR+RH+ABO+RUB AB+AB SCR+CB...
Antibody Screen: NEGATIVE
HIV Screen 4th Generation wRfx: NONREACTIVE
Hematocrit: 39.4 % (ref 34.0–46.6)
Hemoglobin: 13 g/dL (ref 11.1–15.9)
Hepatitis B Surface Ag: NEGATIVE
MCH: 29.8 pg (ref 26.6–33.0)
MCHC: 33 g/dL (ref 31.5–35.7)
MCV: 90 fL (ref 79–97)
Platelets: 436 10*3/uL (ref 150–450)
RBC: 4.36 x10E6/uL (ref 3.77–5.28)
RDW: 13.2 % (ref 11.7–15.4)
RPR Ser Ql: NONREACTIVE
Rh Factor: POSITIVE
Rubella Antibodies, IGG: 1.57 index (ref >0.99)
Varicella zoster IgG: 417 index (ref >165)
WBC: 11 10*3/uL — ABNORMAL HIGH (ref 3.4–10.8)

## 2020-02-12 LAB — HEPATITIS C ANTIBODY: Hep C Virus Ab: <0.1 s/co ratio (ref 0.0–0.9)

## 2020-02-13 LAB — CYTOLOGY - PAP
Chlamydia: NEGATIVE
Comment: NEGATIVE
Comment: NEGATIVE
Comment: NEGATIVE
Comment: NORMAL
Diagnosis: NEGATIVE
High risk HPV: NEGATIVE
Neisseria Gonorrhea: NEGATIVE
Trichomonas: NEGATIVE

## 2020-03-03 ENCOUNTER — Encounter: Payer: Self-pay | Admitting: Obstetrics and Gynecology

## 2020-03-03 ENCOUNTER — Other Ambulatory Visit: Payer: Medicare Other

## 2020-03-03 ENCOUNTER — Other Ambulatory Visit: Payer: Self-pay | Admitting: Obstetrics and Gynecology

## 2020-03-03 ENCOUNTER — Ambulatory Visit (INDEPENDENT_AMBULATORY_CARE_PROVIDER_SITE_OTHER): Payer: BC Managed Care – PPO

## 2020-03-03 ENCOUNTER — Ambulatory Visit (INDEPENDENT_AMBULATORY_CARE_PROVIDER_SITE_OTHER): Payer: BC Managed Care – PPO | Admitting: Obstetrics and Gynecology

## 2020-03-03 ENCOUNTER — Other Ambulatory Visit: Payer: Self-pay

## 2020-03-03 VITALS — BP 120/70 | Ht 63.0 in | Wt 228.2 lb

## 2020-03-03 DIAGNOSIS — Z3401 Encounter for supervision of normal first pregnancy, first trimester: Secondary | ICD-10-CM

## 2020-03-03 DIAGNOSIS — Z3A01 Less than 8 weeks gestation of pregnancy: Secondary | ICD-10-CM

## 2020-03-03 DIAGNOSIS — O99213 Obesity complicating pregnancy, third trimester: Secondary | ICD-10-CM

## 2020-03-03 LAB — POCT URINALYSIS DIPSTICK OB: Glucose, UA: NEGATIVE

## 2020-03-03 MED ORDER — PRENATE MINI 29-0.6-0.4-350 MG PO CAPS: 1.0000 | ORAL_CAPSULE | Freq: Every day | ORAL | 11 refills | Status: DC

## 2020-03-03 NOTE — Progress Notes (Signed)
Routine Prenatal Care Visit  Subjective  Shannon Foley is a 34 y.o. G1P0000 at [redacted]w[redacted]d being seen today for ongoing prenatal care.  She is currently monitored for the following issues for this high-risk pregnancy and has Anemia; Other enthesopathy of unspecified foot; Pituitary lesion ([place]); Fatigue; Hyperprolactinemia ([place]); New onset of headaches; Pain in foot; Dupuytren's contracture of foot; Plantar fasciitis; Rathke's cleft cyst ([place]); Vitamin B 12 deficiency; Abnormal findings-gastrointestinal tract; Chest pain; Epigastric abdominal pain; Encounter for general adult medical examination with abnormal findings; Gastroesophageal reflux disease without esophagitis; Chronic midline low back pain without sciatica; Chronic midline thoracic back pain; Urinary tract infection without hematuria; Dysuria; IBS (irritable bowel syndrome); Uterine fibroid; Hiatal hernia; Left hip pain; Needs flu shot; Calcaneal bursitis; 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  ----------------------------------------------------------------------------------- Patient reports no complaints.   Contractions: Not present. Vag. Bleeding: None.  Movement: Absent. Denies leaking of fluid.  ----------------------------------------------------------------------------------- The following portions of the patient's history were reviewed and updated as appropriate: allergies, current medications, past family history, past medical history, past social  history, past surgical history and problem list. Problem list updated.   Objective  Blood pressure 120/70, height 5\' 3"  (1.6 m), weight 228 lb 3.2 oz (103.5 kg), last menstrual period 01/11/2020. Pregravid weight 230 lb (104.3 kg) Total Weight Gain -1 lb 12.8 oz (-0.816 kg) Urinalysis:      Fetal Status: Fetal Heart Rate (bpm): 160   Movement: Absent     General:  Alert, oriented and cooperative. Patient is in no acute distress.  Skin: Skin is warm and dry. No rash noted.   Cardiovascular: Normal heart rate noted  Respiratory: Normal respiratory effort, no problems with respiration noted  Abdomen: Soft, gravid, appropriate for gestational age. Pain/Pressure: Absent     Pelvic:  Cervical exam deferred        Extremities: Normal range of motion.     Mental Status: Normal mood and affect. Normal behavior. Normal judgment and thought content.     Assessment   34 y.o. G1P0000 at [redacted]w[redacted]d by  10/17/2020, by Last Menstrual Period presenting for routine prenatal visit  Plan   pregnancy 1 Problems (from 02/11/20 to present)    Problem Noted Resolved   Encounter for supervision of normal first pregnancy in first trimester 02/12/2020 by Homero Fellers, MD No   Overview Addendum 03/03/2020  3:49 PM by Homero Fellers, MD     Nursing Staff Provider  Office Location  [facility] Dating   LMP = 7 wk [place]  Language  English Anatomy US    Flu Vaccine   Genetic Screen  NIPS:   AFP:   First Screen:    TDaP vaccine    Hgb A1C or  GTT Early : Third trimester :   Rhogam   not needed   LAB RESULTS   Feeding Plan  Blood Type O/Positive/-- (05/26 1604)   Contraception  Antibody Negative (05/26 1604)  Circumcision  Rubella 1.57 (05/26 1604)  Pediatrician   RPR Non Reactive (05/26 1604)   Support Person  HBsAg Negative (05/26 1604)   Prenatal Classes  HIV Non  Reactive (05/26 1604)    Varicella     BTL Consent  GBS  (For PCN allergy, check sensitivities)        VBAC Consent  Pap      Hgb  Electro      CF      SMA               Previous Version   Advanced maternal age in multigravida, first trimester 02/12/2020 by Homero Fellers, MD No   Obesity in pregnancy, antepartum, third trimester 02/12/2020 by Homero Fellers, MD No       Discussed weight gain goals for pregnancy 1GTT today rx PNV sent Discussed EKG at hospital  Gestational age appropriate obstetric precautions including but not limited to vaginal bleeding, contractions, leaking of fluid and fetal movement were reviewed in detail with the patient.    Return in about 3 weeks (around 03/24/2020) for ROB in person.  Homero Fellers MD [facility] 03/03/2020, 4:11 PM

## 2020-03-03 NOTE — Patient Instructions (Addendum)
First Trimester of Pregnancy The first trimester of pregnancy is from week 1 until the end of week 13 (months 1 through 3). A week after a sperm fertilizes an egg, the egg will implant on the wall of the uterus. This embryo will begin to develop into a baby. Genes from you and your partner will form the baby. The female genes will determine whether the baby will be a boy or a girl. At 6-8 weeks, the eyes and face will be formed, and the heartbeat can be seen on ultrasound. At the end of 12 weeks, all the baby's organs will be formed. Now that you are pregnant, you will want to do everything you can to have a healthy baby. Two of the most important things are to get good prenatal care and to follow your health care provider's instructions. Prenatal care is all the medical care you receive before the baby's birth. This care will help prevent, find, and treat any problems during the pregnancy and childbirth. Body changes during your first trimester Your body goes through many changes during pregnancy. The changes vary from woman to woman.  You may gain or lose a couple of pounds at first.  You may feel sick to your stomach (nauseous) and you may throw up (vomit). If the vomiting is uncontrollable, call your health care provider.  You may tire easily.  You may develop headaches that can be relieved by medicines. All medicines should be approved by your health care provider.  You may urinate more often. Painful urination may mean you have a bladder infection.  You may develop heartburn as a result of your pregnancy.  You may develop constipation because certain hormones are causing the muscles that push stool through your intestines to slow down.  You may develop hemorrhoids or swollen veins (varicose veins).  Your breasts may begin to grow larger and become tender. Your nipples may stick out more, and the tissue that surrounds them (areola) may become darker.  Your gums may bleed and may be  sensitive to brushing and flossing.  Dark spots or blotches (chloasma, mask of pregnancy) may develop on your face. This will likely fade after the baby is born.  Your menstrual periods will stop.  You may have a loss of appetite.  You may develop cravings for certain kinds of food.  You may have changes in your emotions from day to day, such as being excited to be pregnant or being concerned that something may go wrong with the pregnancy and baby.  You may have more vivid and strange dreams.  You may have changes in your hair. These can include thickening of your hair, rapid growth, and changes in texture. Some women also have hair loss during or after pregnancy, or hair that feels dry or thin. Your hair will most likely return to normal after your baby is born. What to expect at prenatal visits During a routine prenatal visit:  You will be weighed to make sure you and the baby are growing normally.  Your blood pressure will be taken.  Your abdomen will be measured to track your baby's growth.  The fetal heartbeat will be listened to between weeks 10 and 14 of your pregnancy.  Test results from any previous visits will be discussed. Your health care provider may ask you:  How you are feeling.  If you are feeling the baby move.  If you have had any abnormal symptoms, such as leaking fluid, bleeding, severe headaches, or abdominal   cramping.  If you are using any tobacco products, including cigarettes, chewing tobacco, and electronic cigarettes.  If you have any questions. Other tests that may be performed during your first trimester include:  Blood tests to find your blood type and to check for the presence of any previous infections. The tests will also be used to check for low iron levels (anemia) and protein on red blood cells (Rh antibodies). Depending on your risk factors, or if you previously had diabetes during pregnancy, you may have tests to check for high blood sugar  that affects pregnant women (gestational diabetes).  Urine tests to check for infections, diabetes, or protein in the urine.  An ultrasound to confirm the proper growth and development of the baby.  Fetal screens for spinal cord problems (spina bifida) and Down syndrome.  HIV (human immunodeficiency virus) testing. Routine prenatal testing includes screening for HIV, unless you choose not to have this test.  You may need other tests to make sure you and the baby are doing well. Follow these instructions at home: Medicines  Follow your health care provider's instructions regarding medicine use. Specific medicines may be either safe or unsafe to take during pregnancy.  Take a prenatal vitamin that contains at least 600 micrograms (mcg) of folic acid.  If you develop constipation, try taking a stool softener if your health care provider approves. Eating and drinking   Eat a balanced diet that includes fresh fruits and vegetables, whole grains, good sources of protein such as meat, eggs, or tofu, and low-fat dairy. Your health care provider will help you determine the amount of weight gain that is right for you.  Avoid raw meat and uncooked cheese. These carry germs that can cause birth defects in the baby.  Eating four or five small meals rather than three large meals a day may help relieve nausea and vomiting. If you start to feel nauseous, eating a few soda crackers can be helpful. Drinking liquids between meals, instead of during meals, also seems to help ease nausea and vomiting.  Limit foods that are high in fat and processed sugars, such as fried and sweet foods.  To prevent constipation: ? Eat foods that are high in fiber, such as fresh fruits and vegetables, whole grains, and beans. ? Drink enough fluid to keep your urine clear or pale yellow. Activity  Exercise only as directed by your health care provider. Most women can continue their usual exercise routine during  pregnancy. Try to exercise for 30 minutes at least 5 days a week. Exercising will help you: ? Control your weight. ? Stay in shape. ? Be prepared for labor and delivery.  Experiencing pain or cramping in the lower abdomen or lower back is a good sign that you should stop exercising. Check with your health care provider before continuing with normal exercises.  Try to avoid standing for long periods of time. Move your legs often if you must stand in one place for a long time.  Avoid heavy lifting.  Wear low-heeled shoes and practice good posture.  You may continue to have sex unless your health care provider tells you not to. Relieving pain and discomfort  Wear a good support bra to relieve breast tenderness.  Take warm sitz baths to soothe any pain or discomfort caused by hemorrhoids. Use hemorrhoid cream if your health care provider approves.  Rest with your legs elevated if you have leg cramps or low back pain.  If you develop varicose veins in   your legs, wear support hose. Elevate your feet for 15 minutes, 3-4 times a day. Limit salt in your diet. Prenatal care  Schedule your prenatal visits by the twelfth week of pregnancy. They are usually scheduled monthly at first, then more often in the last 2 months before delivery.  Write down your questions. Take them to your prenatal visits.  Keep all your prenatal visits as told by your health care provider. This is important. Safety  Wear your seat belt at all times when driving.  Make a list of emergency phone numbers, including numbers for family, friends, the hospital, and police and fire departments. General instructions  Ask your health care provider for a referral to a local prenatal education class. Begin classes no later than the beginning of month 6 of your pregnancy.  Ask for help if you have counseling or nutritional needs during pregnancy. Your health care provider can offer advice or refer you to specialists for help  with various needs.  Do not use hot tubs, steam rooms, or saunas.  Do not douche or use tampons or scented sanitary pads.  Do not cross your legs for long periods of time.  Avoid cat litter boxes and soil used by cats. These carry germs that can cause birth defects in the baby and possibly loss of the fetus by miscarriage or stillbirth.  Avoid all smoking, herbs, alcohol, and medicines not prescribed by your health care provider. Chemicals in these products affect the formation and growth of the baby.  Do not use any products that contain nicotine or tobacco, such as cigarettes and e-cigarettes. If you need help quitting, ask your health care provider. You may receive counseling support and other resources to help you quit.  Schedule a dentist appointment. At home, brush your teeth with a soft toothbrush and be gentle when you floss. Contact a health care provider if:  You have dizziness.  You have mild pelvic cramps, pelvic pressure, or nagging pain in the abdominal area.  You have persistent nausea, vomiting, or diarrhea.  You have a bad smelling vaginal discharge.  You have pain when you urinate.  You notice increased swelling in your face, hands, legs, or ankles.  You are exposed to fifth disease or chickenpox.  You are exposed to German measles (rubella) and have never had it. Get help right away if:  You have a fever.  You are leaking fluid from your vagina.  You have spotting or bleeding from your vagina.  You have severe abdominal cramping or pain.  You have rapid weight gain or loss.  You vomit blood or material that looks like coffee grounds.  You develop a severe headache.  You have shortness of breath.  You have any kind of trauma, such as from a fall or a car accident. Summary  The first trimester of pregnancy is from week 1 until the end of week 13 (months 1 through 3).  Your body goes through many changes during pregnancy. The changes vary from  woman to woman.  You will have routine prenatal visits. During those visits, your health care provider will examine you, discuss any test results you may have, and talk with you about how you are feeling. This information is not intended to replace advice given to you by your health care provider. Make sure you discuss any questions you have with your health care provider. Document Revised: 08/17/2017 Document Reviewed: 08/16/2016 Elsevier Patient Education  2020 [employer]  

## 2020-03-04 LAB — PROTEIN / CREATININE RATIO, URINE
Creatinine, Urine: 246.3 mg/dL
Protein, Ur: 11.6 mg/dL
Protein/Creat Ratio: 47 mg/g creat (ref 0–200)

## 2020-03-04 LAB — GLUCOSE TOLERANCE, 1 HOUR: Glucose, 1Hr PP: 113 mg/dL (ref 65–199)

## 2020-03-11 ENCOUNTER — Telehealth: Payer: Self-pay

## 2020-03-11 ENCOUNTER — Other Ambulatory Visit: Payer: Self-pay | Admitting: Obstetrics and Gynecology

## 2020-03-11 DIAGNOSIS — Z3401 Encounter for supervision of normal first pregnancy, first trimester: Secondary | ICD-10-CM

## 2020-03-11 MED ORDER — VITAFOL FE+ 90-0.6-0.4-200 MG PO CAPS: 1.0000 | ORAL_CAPSULE | Freq: Every day | ORAL | 11 refills | Status: DC

## 2020-03-11 NOTE — Telephone Encounter (Signed)
Resent new Rx- if pharmacy can give any direction of what would be covered that would be helpful. Prescription can be changed to whatever they will cover.

## 2020-03-11 NOTE — Telephone Encounter (Signed)
Pt aware. Will let us know if she has any further issues.

## 2020-03-11 NOTE — Telephone Encounter (Signed)
Pt left msg on triage line requesting provider that saw her 6/16 to resend Rx to her pharmacy because pharmacy does not have it. I called pharmacy and was told that prenatal Rx sent on 6/16 is not covered by pts insurance. Pt is requesting different prenatal Rx to be send to her pharmacy.

## 2020-03-15 ENCOUNTER — Telehealth: Payer: Self-pay

## 2020-03-15 NOTE — Telephone Encounter (Signed)
Confirmed appointment on 03/17/2020 and screened for covid. klh 

## 2020-03-15 NOTE — Telephone Encounter (Signed)
Patient can have note with lifting restrictions of no more than 25lbs and breaks every 2 hours for 15 minutes with access to water during break.

## 2020-03-17 ENCOUNTER — Ambulatory Visit: Payer: BC Managed Care – PPO | Admitting: Adult Health

## 2020-03-23 ENCOUNTER — Telehealth: Payer: Self-pay

## 2020-03-23 NOTE — Telephone Encounter (Signed)
Pt called after hour nurse 03/21/20 3:59am c/o having light pink blood; [redacted]w[redacted]d; no abd pain; began this am.  After hour nurse adv she be seen in 2-3 days, avoid heavy lifting or strenuous labor, no IC until seen.  Pt already has appt sched for tomorrow. Called pt who states she is better; adv to keep appt tomorrow.

## 2020-03-24 ENCOUNTER — Ambulatory Visit (INDEPENDENT_AMBULATORY_CARE_PROVIDER_SITE_OTHER): Payer: BC Managed Care – PPO | Admitting: Advanced Practice Midwife

## 2020-03-24 ENCOUNTER — Other Ambulatory Visit: Payer: Self-pay

## 2020-03-24 ENCOUNTER — Encounter: Payer: Self-pay | Admitting: Advanced Practice Midwife

## 2020-03-24 VITALS — BP 122/74 | Wt 228.0 lb

## 2020-03-24 DIAGNOSIS — Z3A10 10 weeks gestation of pregnancy: Secondary | ICD-10-CM

## 2020-03-24 DIAGNOSIS — O99211 Obesity complicating pregnancy, first trimester: Secondary | ICD-10-CM

## 2020-03-24 DIAGNOSIS — O0991 Supervision of high risk pregnancy, unspecified, first trimester: Secondary | ICD-10-CM | POA: Diagnosis not present

## 2020-03-24 DIAGNOSIS — N898 Other specified noninflammatory disorders of vagina: Secondary | ICD-10-CM

## 2020-03-24 MED ORDER — TERCONAZOLE 0.8 % VA CREA: 1.0000 | TOPICAL_CREAM | Freq: Every day | VAGINAL | 0 refills | Status: DC

## 2020-03-24 NOTE — Patient Instructions (Signed)

## 2020-03-24 NOTE — Progress Notes (Signed)
Pt had light pink spotting when she wiped on July 4th.

## 2020-03-24 NOTE — Progress Notes (Signed)
Routine Prenatal Care Visit  Subjective  Shannon Foley is a 34 y.o. G1P0000 at [redacted]w[redacted]d being seen today for ongoing prenatal care.  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[place]); Vitamin B 12 deficiency; Abnormal findings-gastrointestinal tract; Chest pain; Epigastric abdominal pain; Encounter for general adult medical examination with abnormal findings; Gastroesophageal reflux disease without esophagitis; Chronic midline low back pain without sciatica; Chronic midline thoracic back pain; Urinary tract infection without hematuria; Dysuria; IBS (irritable bowel syndrome); Uterine fibroid; Hiatal hernia; Left hip pain; Needs flu shot; Calcaneal bursitis; 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  ----------------------------------------------------------------------------------- Patient reports vaginal itching with some thin discharge. We discussed likelihood that we will not be able to hear heart tones with doppler in the room today.   Contractions: Not present. Vag. Bleeding: None.  Movement: Absent. Leaking Fluid denies.  ----------------------------------------------------------------------------------- The following portions of the patient's history were reviewed and updated as  appropriate: allergies, current medications, past family history, past medical history, past social history, past surgical history and problem list. Problem list updated.  Objective  Blood pressure 122/74, weight 228 lb (103.4 kg), last menstrual period 01/11/2020. Pregravid weight 230 lb (104.3 kg) Total Weight Gain -2 lb (-0.907 kg) Urinalysis: Urine Protein    Urine Glucose    Fetal Status:     Movement: Absent   Unable to hear FHTs with doppler  General:  Alert, oriented and cooperative. Patient is in no acute distress.  Skin: Skin is warm and dry. No rash noted.   Cardiovascular: Normal heart rate noted  Respiratory: Normal respiratory effort, no problems with respiration noted  Abdomen: Soft, gravid, appropriate for gestational age. Pain/Pressure: Absent     Pelvic:  Cervical exam deferred        Extremities: Normal range of motion.     Mental Status: Normal mood and affect. Normal behavior. Normal judgment and thought content.   Assessment   34 y.o. G1P0000 at [redacted]w[redacted]d by  10/17/2020, by Last Menstrual Period presenting for routine prenatal visit  Plan   pregnancy 1 Problems (from 02/11/20 to present)    Problem Noted Resolved   Supervision of high risk pregnancy, antepartum 02/12/2020 by Homero Fellers, MD No   Overview Addendum 03/03/2020  3:49 PM by Homero Fellers, MD     Nursing Staff Provider  Office Location  [facility] Dating   LMP = 7 wk [place]  Language  English Anatomy US    Flu Vaccine   Genetic Screen  NIPS:   AFP:   First Screen:    TDaP vaccine    Hgb A1C or  GTT Early : Third trimester :   Rhogam   not needed   LAB RESULTS   Feeding Plan  Blood Type O/Positive/-- (05/26 1604)   Contraception  Antibody Negative (05/26 1604)  Circumcision  Rubella 1.57 (05/26 1604)  Pediatrician   RPR Non Reactive (05/26 1604)   Support Person  HBsAg Negative (05/26 1604)  Prenatal Classes  HIV Non Reactive (05/26 1604)    Varicella     BTL Consent  GBS  (For PCN  allergy, check sensitivities)        VBAC Consent  Pap      Hgb Electro      CF      SMA               Previous Version   Advanced maternal age in multigravida, first trimester 02/12/2020 by Homero Fellers, MD No   Obesity affecting pregnancy in first trimester 02/12/2020 by Homero Fellers, MD No    MaterniT 21 today   Preterm labor symptoms and general obstetric precautions including but not limited to vaginal bleeding, contractions, leaking of fluid and fetal movement were reviewed in detail with the patient. Please refer to After Visit Summary for other counseling recommendations.   Return in about 2 weeks (around 04/07/2020) for rob.   Rod Can, CNM 03/24/2020 2:37 PM

## 2020-03-30 ENCOUNTER — Telehealth: Payer: Self-pay

## 2020-03-30 LAB — MATERNIT 21 PLUS CORE, BLOOD
Fetal Fraction: 8
Result (T21): POSITIVE — AB
Trisomy 13 (Patau syndrome): NEGATIVE
Trisomy 18 (Edwards syndrome): NEGATIVE

## 2020-03-30 NOTE — Telephone Encounter (Signed)
[facility] calling to ensure that Abnormal finding on MaterniT21 drawn 03/24/20 were received. If you need a copy or have any questions [phone]

## 2020-03-31 ENCOUNTER — Other Ambulatory Visit: Payer: Self-pay | Admitting: Advanced Practice Midwife

## 2020-03-31 DIAGNOSIS — O285 Abnormal chromosomal and genetic finding on antenatal screening of mother: Secondary | ICD-10-CM

## 2020-03-31 NOTE — Telephone Encounter (Signed)
Patient's mother Ivin Booty is calling to follow up on patient's results. She is on patient's DPR and would like some questions answer. She is aware [facility] is on Call today. Please advise

## 2020-03-31 NOTE — Progress Notes (Signed)
Referral to MFM for genetic counseling. Patient aware of positive trisomy 21 screening result and that referral has been sent.

## 2020-03-31 NOTE — Telephone Encounter (Signed)
Spoke with patient's mother and answered her questions.

## 2020-04-01 ENCOUNTER — Ambulatory Visit: Payer: BC Managed Care – PPO | Attending: Maternal & Fetal Medicine

## 2020-04-01 ENCOUNTER — Other Ambulatory Visit: Payer: Self-pay

## 2020-04-01 DIAGNOSIS — O281 Abnormal biochemical finding on antenatal screening of mother: Secondary | ICD-10-CM | POA: Insufficient documentation

## 2020-04-01 DIAGNOSIS — Z3A11 11 weeks gestation of pregnancy: Secondary | ICD-10-CM

## 2020-04-01 NOTE — Progress Notes (Signed)
Virtual Visit via Telephone Note  I connected with [name] on 04/01/20 at  3:00 PM EDT by telephone and verified that I am speaking with the correct person using two identifiers.   Referring provider:  [facility], [place] Length of consultation:  48 minutes  Ms.Larmore was referred for genetic counseling at [facility], as results from noninvasive prenatal screening (NIPS) came back positive for an increased risk of trisomy 11, or Down syndrome, in the current pregnancy. We reviewed these results in detail along with testing options for the current pregnancy.     To review, Down syndrome is one of the most common extra chromosome conditions, as approximately 1 in 800 babies are born with this condition. There are different types of Down syndrome, with each type determined by the arrangement of the chromosome 21 pair. Approximately 95% of cases are caused by an entire extra copy of chromosome 21 (trisomy 21), and 2-4% of cases are due to a chromosomal rearrangement (translocation) involving chromosome 21. We reviewed that Down syndrome most commonly occurs by chance due to an error in chromosomal division during the formation of egg and sperm cells in a process called nondisjunction.    Down syndrome is characterized by a distinctive facial appearance, mild to moderate intellectual disability, and an increased chance for a heart defect. Approximately half of babies with Down syndrome are born with a heart defect that may require surgery after birth. While many children with Down syndrome look similar to each other, each child with Down syndrome is unique and will have many more features in common with his or her own family members. Children with Down syndrome also have an increased chance for thyroid problems, which can range from an underactive to an overactive thyroid. Additionally, low muscle tone, gastrointestinal abnormalities, vision problems, and respiratory and  ear infections are more common among babies with Down syndrome. We discussed that there are many more features that can be associated with Down syndrome; however, it is not possible to accurately predict all features that would be present in an individual with Down syndrome prenatally. Additionally, there is a high degree of variability seen among children who have this condition, meaning that every child with Down syndrome will not be affected in exactly the same way, and some children will have more or less features than others. It is not possible to predict what strengths and weaknesses a child with Down syndrome will have, just like it is not possible to predict this for any child. With the advances in medical technology, early intervention, and supportive therapies, many individuals with Down syndrome are able to live with an increasing degree of independence. Today, many adults with Down syndrome care for themselves, have jobs, and often live in group homes or apartments where assistance is available if needed.   We reviewed that NIPS analyzes cell free DNA originating from the placenta that is found in the maternal blood circulation during pregnancy. This test can provide information regarding the presence or absence of extra fetal DNA for chromosomes 13, 18 and 21 as well as the sex chromosomes. The reported detection rate is greater than 99% for trisomy 21, greater than 99% for trisomy 18, greater than 91% for trisomy 13, and greater than 94% for monosomy X. However, it is not be considered diagnostic for chromosome conditions. Positive predictive value (PPV) is the probability that a pregnancy with a positive test result is truly affected. The PPV calculator offered by the [facility]  [employer] and the [employer] takes into consideration both the maternal age as well as the lab results, and estimated the PPV for trisomy 21 in the current pregnancy to be 79%. Thus, there  is a 21% chance that this could be a false positive result.   Ms.Rhett was counseled that there are several possible explanations for her high risk NIPS result. First, the fetus could truly be affected by Down syndrome. Second, the fetus could be mosaic for Down syndrome, though this is rare. Mosaicism occurs when an individual has two or more genetically different sets of cells in their body. Individuals who are mosaic for Down syndrome have some cells in the body with trisomy 21 and may have other cells that are chromosomally normal. We discussed that it would not be possible to tell which features an individual with mosaic Down syndrome may have, as it is impossible to assess which specific cells and tissues in the body have Down syndrome. Individuals who are mosaic for Down syndrome may be more mildly affected than individuals with full trisomy 21, though this is not always the case. Third, the placenta could have and extra copy of chromosome 21 while the fetus could be unaffected. This is a phenomenon known as confined placental mosaicism (CPM). Lastly, this could be a false positive result that resulted due to normal variation.  Diagnostic testing:   Ms. Ormand was also counseled regarding diagnostic testing via CVS or amniocentesis.   CVS is typically performed between 11-[redacted] weeks gestation.  We reviewed the procedure as well as the risk for complications including miscarriage of 1 in 200 to 1 in 500 with this testing.  Cultured chorionic villi, which are placental cells, allow for the visualization of a fetal karyotype, which can detect >99% of chromosomal aberrations. Chromosomal microarray can also be performed to identify smaller deletions or duplications of fetal chromosomal material. Because this testing examines placental cells, there is a small chance for confined placental mosaicism, when the chromosome complement of the placenta is different from that of the fetus. This chance is  approximately 1%.  Amniocentesis is an option beginning at 50 weeks' gestation. We discussed the technical aspects of the procedure and quoted up to a 1 in 500 (0.2%) risk for spontaneous pregnancy loss or other adverse pregnancy outcomes as a result of amniocentesis. Cultured cells from an amniocentesis sample allow for the visualization of a fetal karyotype, which can detect >99% of chromosomal aberrations. Chromosomal microarray can also be performed to identify smaller deletions or duplications of fetal chromosomal material. Ms. Musa was informed that diagnostic testing would be the only way to definitively determine if the fetus has Down syndrome prenatally.   Ms. Prigmore was also made aware that she has the option of continuing with standard ultrasounds and pursuing genetic testing postnatally. We discussed that approximately half (50%) of fetuses with Down syndrome demonstrate signs of the condition on anatomy ultrasound. Additionally, fetal echocardiogram for a deeper assessment of cardiac defects is recommended given the increased risk for congenital heart defects associated with Down syndrome.  We obtained a detailed family history and pregnancy history.  Ms. Pina reported one brother with autism.  He is 35 years old and was diagnosed at age 63 years.  He has no other medical concerns and no genetic testing has been performed to determine an underlying cause for his condition.  We discussed that autism is part of the spectrum of conditions referred to as Autistic spectrum disorders (  ASD). We discussed that ASDs are among the most common neurodevelopmental disorders, with approximately 1 in 68 children meeting criteria for ASD, according to the Centers for Disease Control. Approximately 80% of individuals diagnosed are female. There is strong evidence that genetic factors play a critical role in development of ASD. There have been recent advances in identifying specific genetic causes of ASD,  however, there are still many individuals for whom the etiology of the ASD is not known. Because the etiology of the autism is not known, we discussed the availability of carrier screening for Fragile X syndrome, the most common inherited cause for intellectual disability in males.  Fragile X syndrome is caused by a change, or expansion, of the DNA in the gene FMR1.  Carrier screening is used to determine the number of repeats in this region of the gene and to determine the likelihood of having a child with this condition.  A negative result would mean that the individual undergoing carrier testing is not at risk for having a child with Fragile X, but cannot exclude other causes for autism or developmental differences.  In some situations, results from this testing may be inconclusive. The remainder of the family history was unremarkable for birth defects, learning differences, recurrent pregnancy loss and known genetic conditions.    This is the first pregnancy for this couple. The patient reported no complications or exposures to medications, alcohol, tobacco or recreational drugs in the pregnancy that would be expected to increase the risk for birth defects.  After careful consideration, Ms. Rotundo elected to have a first trimester anatomy ultrasound on 04/06/2020 at [facility].  She indicated that she is leaning away from CVS testing at this time, but will decide following this visit if she desires any prenatal diagnostic testing other than ultrasound.  We also spoke about the fact that some families may consider pregnancy termination if a pregnancy is found to have a chromosome condition.  She stated that she would not consider termination of pregnancy regardless of the results of this testing.  We also offered routine carrier screening for recessive conditions including hemoglobinopathies, cystic fibrosis and spinal muscular atrophy.  The patient is of mixed African American and  Caucasian ancestry and her partner is African Optometrist.  They are not consanguinous. She would like to proceed with carrier screening for CF, SMA, Fragile X and hemoglobinopathies when she comes to our clinic on 04/06/20.  The patient was encouraged to contact us at [phone] with any questions or concerns.   Wilburt Finlay, MS, CGC   I provided 60 minutes of non-face-to-face time during this encounter.   Donette Larry

## 2020-04-05 ENCOUNTER — Other Ambulatory Visit: Payer: Self-pay | Admitting: Obstetrics and Gynecology

## 2020-04-05 ENCOUNTER — Other Ambulatory Visit: Payer: Self-pay | Admitting: Cardiology

## 2020-04-05 ENCOUNTER — Other Ambulatory Visit: Payer: Self-pay | Admitting: Internal Medicine

## 2020-04-05 DIAGNOSIS — O3513X0 Maternal care for (suspected) chromosomal abnormality in fetus, trisomy 21, not applicable or unspecified: Secondary | ICD-10-CM

## 2020-04-05 DIAGNOSIS — O99211 Obesity complicating pregnancy, first trimester: Secondary | ICD-10-CM

## 2020-04-06 ENCOUNTER — Other Ambulatory Visit: Payer: Self-pay

## 2020-04-06 ENCOUNTER — Other Ambulatory Visit
Admission: RE | Admit: 2020-04-06 | Discharge: 2020-04-06 | Disposition: A | Discharge status: Home / Self Care | Payer: BC Managed Care – PPO | Source: Ambulatory Visit | Attending: Obstetrics | Admitting: Obstetrics

## 2020-04-06 ENCOUNTER — Ambulatory Visit: Payer: BC Managed Care – PPO

## 2020-04-06 ENCOUNTER — Ambulatory Visit (HOSPITAL_BASED_OUTPATIENT_CLINIC_OR_DEPARTMENT_OTHER): Payer: BC Managed Care – PPO

## 2020-04-06 VITALS — BP 116/76 | HR 76 | Temp 98.9°F | Resp 18 | Ht 62.0 in | Wt 226.5 lb

## 2020-04-06 DIAGNOSIS — O351XX0 Maternal care for (suspected) chromosomal abnormality in fetus, not applicable or unspecified: Secondary | ICD-10-CM | POA: Diagnosis not present

## 2020-04-06 DIAGNOSIS — O99211 Obesity complicating pregnancy, first trimester: Secondary | ICD-10-CM | POA: Diagnosis not present

## 2020-04-06 DIAGNOSIS — O09521 Supervision of elderly multigravida, first trimester: Secondary | ICD-10-CM | POA: Insufficient documentation

## 2020-04-06 DIAGNOSIS — Z3A12 12 weeks gestation of pregnancy: Secondary | ICD-10-CM | POA: Diagnosis not present

## 2020-04-06 DIAGNOSIS — O281 Abnormal biochemical finding on antenatal screening of mother: Secondary | ICD-10-CM | POA: Insufficient documentation

## 2020-04-06 DIAGNOSIS — O0990 Supervision of high risk pregnancy, unspecified, unspecified trimester: Secondary | ICD-10-CM

## 2020-04-06 DIAGNOSIS — O3513X0 Maternal care for (suspected) chromosomal abnormality in fetus, trisomy 21, not applicable or unspecified: Secondary | ICD-10-CM

## 2020-04-07 ENCOUNTER — Other Ambulatory Visit: Payer: Self-pay | Admitting: Obstetrics and Gynecology

## 2020-04-07 ENCOUNTER — Ambulatory Visit (INDEPENDENT_AMBULATORY_CARE_PROVIDER_SITE_OTHER): Payer: BC Managed Care – PPO | Admitting: Obstetrics

## 2020-04-07 VITALS — BP 128/88

## 2020-04-07 DIAGNOSIS — Z3A14 14 weeks gestation of pregnancy: Secondary | ICD-10-CM

## 2020-04-07 DIAGNOSIS — O021 Missed abortion: Secondary | ICD-10-CM | POA: Diagnosis not present

## 2020-04-07 DIAGNOSIS — O285 Abnormal chromosomal and genetic finding on antenatal screening of mother: Secondary | ICD-10-CM

## 2020-04-07 NOTE — Progress Notes (Signed)
Shannon Foley presents to the office one day after an ultrasound appointment with MFM where the ultrasound identified a MAB.  No cardiac activity was seen and the patient was informed. She is here with her mother today to discuss her desires regarding The [facility]. The patient has had some time to grieve and shares that she wants to have a D and C performed. According to Dr. Glennon Mac,  Dr. Georgianne Fick is available next Tuesday, July 27th to perform the D and C. Today, reiterated that the patient did not cause this outcome. Much empathy expressed to both the patient and her mother during this visit. They have decided that Tuesday, 7/27/ will work for them. I have answered a number of their questions today, and they expect to be contacted for any specific instructions regarding where and when to report to [facility] for the D and C, and when they need to get COVID tested. Denyse Dago has been notified to schedule this procedure, and a voicemail is left for Dr. Georgianne Fick updating him on the patient's desire to proceed. She is not scheduled for any specific f/u. I informed her that the office would call her with instructions. [name], CNM  04/07/2020 5:52 PM

## 2020-04-08 ENCOUNTER — Telehealth: Payer: Self-pay | Admitting: Obstetrics and Gynecology

## 2020-04-08 ENCOUNTER — Telehealth: Payer: Self-pay

## 2020-04-08 LAB — HGB FRACTIONATION CASCADE
Hgb A2: 2.6 % (ref 1.8–3.2)
Hgb A: 97.4 % (ref 96.4–98.8)
Hgb F: 0 % (ref 0.0–2.0)
Hgb S: 0 %

## 2020-04-08 NOTE — Telephone Encounter (Signed)
Pt's mom called after hour nurse 04/07/20 5:32pm wanting to speak to the midwife; pt has not had the covid vaccination and wants to know if she should wait until after the [facility] on Tuesday.  [phone].Marland Kitchen

## 2020-04-08 NOTE — Telephone Encounter (Signed)
-----   Message from [name], MD sent at 04/07/2020  6:41 PM EDT ----- Regarding: Surgery Surgery Booking Request Patient Full Name:  Shannon Foley  MRN: [id number]  DOB: [birth year]  Surgeon: [name], MD  Requested Surgery Date and Time: 04/13/2020 to follow previously scheduled cases Primary Diagnosis AND Code: Missed abortion Secondary Diagnosis and Code:  Surgical Procedure: Suction D&C RNFA Requested?: No L&D Notification: No Admission Status: same day surgery Length of Surgery: 50 min Special Case Needs: No H&P: Yes Phone Interview???:  Yes Interpreter: No Medical Clearance:  No Special Scheduling Instructions: Orders placed 04/07/2020 Any known health/anesthesia issues, diabetes, sleep apnea, latex allergy, defibrillator/pacemaker?: No Acuity: P2   (P1 highest, P2 delay may cause harm, P3 low, elective gyn, P4 lowest)

## 2020-04-08 NOTE — Telephone Encounter (Addendum)
Pt aware.  Pt asked for CRS. Adv she is on call.  Pt states she sent her a msg about needing a note for work for her surgery and hasn't heard anything.  Adv I'll be sure CRS gets the msg.

## 2020-04-08 NOTE — Telephone Encounter (Signed)
I agree. There is no rush before her D and C.Can you call and tell them for me? Thanks. I would encourage her to get the [place] shot when she can after her procedure next week.

## 2020-04-08 NOTE — Telephone Encounter (Signed)
I believe you are doing this case, correct?

## 2020-04-08 NOTE — Telephone Encounter (Signed)
Scheduled H&P for tomorrow, 7/23 @ 4:10pm w/ patient. Patient works in [place] tomorrow and Monday 5:30am-2pm and cannot have [facility] testing those days. Patient said Joycelyn Schmid told her she could have her COVID testing done when she got to the hospital. I will check with Pre-admit testing. Patient confirmed Medicare, BCBS, and Medicaid.

## 2020-04-08 NOTE — Telephone Encounter (Signed)
Called patient- told her I had passed her message to Georgianne Fick so that he can provide her with a note as the person doing her surgery. She is seeing him tomorrow and I let her know that he can give her a note at that time.  Thank you.

## 2020-04-09 ENCOUNTER — Ambulatory Visit (INDEPENDENT_AMBULATORY_CARE_PROVIDER_SITE_OTHER): Payer: BC Managed Care – PPO | Admitting: Obstetrics and Gynecology

## 2020-04-09 ENCOUNTER — Encounter: Payer: Self-pay | Admitting: Obstetrics and Gynecology

## 2020-04-09 ENCOUNTER — Other Ambulatory Visit
Admission: RE | Admit: 2020-04-09 | Discharge: 2020-04-09 | Disposition: A | Discharge status: Home / Self Care | Payer: BC Managed Care – PPO | Source: Ambulatory Visit | Attending: Obstetrics and Gynecology | Admitting: Obstetrics and Gynecology

## 2020-04-09 ENCOUNTER — Other Ambulatory Visit: Payer: Self-pay

## 2020-04-09 VITALS — BP 120/70 | Ht 62.0 in | Wt 225.0 lb

## 2020-04-09 DIAGNOSIS — Z01812 Encounter for preprocedural laboratory examination: Secondary | ICD-10-CM | POA: Insufficient documentation

## 2020-04-09 DIAGNOSIS — O021 Missed abortion: Secondary | ICD-10-CM | POA: Diagnosis not present

## 2020-04-09 DIAGNOSIS — Z01818 Encounter for other preprocedural examination: Secondary | ICD-10-CM

## 2020-04-09 DIAGNOSIS — Z20822 Contact with and (suspected) exposure to covid-19: Secondary | ICD-10-CM | POA: Diagnosis not present

## 2020-04-09 LAB — CBC
HCT: 38.3 % (ref 36.0–46.0)
Hemoglobin: 13.3 g/dL (ref 12.0–15.0)
MCH: 31.1 pg (ref 26.0–34.0)
MCHC: 34.7 g/dL (ref 30.0–36.0)
MCV: 89.5 fL (ref 80.0–100.0)
Platelets: 413 10*3/uL — ABNORMAL HIGH (ref 150–400)
RBC: 4.28 MIL/uL (ref 3.87–5.11)
RDW: 13.1 % (ref 11.5–15.5)
WBC: 10.4 10*3/uL (ref 4.0–10.5)
nRBC: 0 % (ref 0.0–0.2)

## 2020-04-09 LAB — TYPE AND SCREEN
ABO/RH(D): O POS
Antibody Screen: NEGATIVE
Extend sample reason: UNDETERMINED

## 2020-04-10 LAB — SARS CORONAVIRUS 2 (TAT 6-24 HRS): SARS Coronavirus 2: NEGATIVE

## 2020-04-12 ENCOUNTER — Encounter
Admission: RE | Admit: 2020-04-12 | Discharge: 2020-04-12 | Disposition: A | Discharge status: Home / Self Care | Payer: BC Managed Care – PPO | Source: Ambulatory Visit | Attending: Obstetrics and Gynecology | Admitting: Obstetrics and Gynecology

## 2020-04-12 ENCOUNTER — Other Ambulatory Visit: Payer: Self-pay

## 2020-04-12 ENCOUNTER — Telehealth: Payer: Self-pay | Admitting: Obstetrics and Gynecology

## 2020-04-12 NOTE — H&P (View-Only) (Signed)
Obstetrics & Gynecology Surgery H&P    Chief Complaint: Scheduled Surgery   History of Present Illness: Patient is a 34 y.o. G1P0000 presenting for scheduled suction D&C for the treatment or further evaluation of [redacted]w[redacted]d CRL missed abortion in setting of prior MaterniT21 test results positive for trisomy 21.   Prior Treatments prior to proceeding with surgery include: ultrasound confirmation of missed abortion  Preoperative Pap: 02/11/2020 Results: NILM HPV negative  Preoperative Endometrial biopsy: N/A Preoperative Ultrasound: 04/06/2020 Findings: missed abortion at [redacted]w[redacted]d   Review of Systems:10 point review of systems  Past Medical History:  Patient Active Problem List   Diagnosis Date Noted  . Abnormal biochemical finding on antenatal screening of mother 04/01/2020  . Supervision of high risk pregnancy, antepartum 02/12/2020     Nursing Staff Provider  Office Location  [facility] Dating   LMP = 7 wk [place]  Language  English Anatomy US    Flu Vaccine   Genetic Screen  NIPS:   AFP:   First Screen:    TDaP vaccine    Hgb A1C or  GTT Early : Third trimester :   Rhogam   not needed   LAB RESULTS   Feeding Plan  Blood Type O/Positive/-- (05/26 1604)   Contraception  Antibody Negative (05/26 1604)  Circumcision  Rubella 1.57 (05/26 1604)  Pediatrician   RPR Non Reactive (05/26 1604)   Support Person  HBsAg Negative (05/26 1604)   Prenatal Classes  HIV Non Reactive (05/26 1604)    Varicella     BTL Consent  GBS  (For PCN allergy, check sensitivities)        VBAC Consent  Pap      Hgb Electro      CF      SMA            . Advanced maternal age in multigravida, first trimester 02/12/2020  . Obesity affecting pregnancy in first trimester 02/12/2020  . Vaginal candida 03/18/2019  . CIN I (cervical intraepithelial neoplasia I) 02/13/2019    On 02/2017 colposcopy   . Acute otitis media 01/06/2019  . Acute upper respiratory infection 12/08/2018  . Fever and chills 12/08/2018  .  Sore throat 12/08/2018  . Hiatal hernia 06/23/2018  . Left hip pain 06/23/2018  . Needs flu shot 06/23/2018  . Uterine fibroid 06/13/2018    Fibroid 1: submucosal, on the Left posterior uterine wall = 33.58 x 31.93 mm Fibroid 2: Intramural vs subserosal, on the fudal Left uterine wall = 30.58 x 26.43 mm Fibroid 3: intramural vs Subserosal, on the fundal uterine wall = 6.81 x 5.92 mm   . IBS (irritable bowel syndrome) 05/29/2018  . Low grade squamous intraepithelial lesion (LGSIL) on cervical Pap smear 05/27/2018    With positive HRHPV   . Encounter for general adult medical examination with abnormal findings 04/03/2018  . Gastroesophageal reflux disease without esophagitis 04/03/2018  . Chronic midline low back pain without sciatica 04/03/2018  . Chronic midline thoracic back pain 04/03/2018  . Urinary tract infection without hematuria 04/03/2018  . Dysuria 04/03/2018  . Chest pain 01/08/2018  . Epigastric abdominal pain 01/08/2018  . Abnormal findings-gastrointestinal tract   . Rathke's cleft cyst ([place]) 02/02/2016  . Anemia 04/29/2014    Overview:  Diagnosed with both Fe defic and B12 defic per pt  Overview:  Diagnosed with both Fe defic and B12 defic per pt  Diagnosed with both Fe defic and B12 defic per pt Diagnosed with both Fe defic  and B12 defic per pt  Overview:  Diagnosed with both Fe defic and B12 defic per pt   . Fatigue 04/29/2014  . Hyperprolactinemia ([place]) 04/29/2014  . Vitamin B 12 deficiency 04/29/2014  . 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 Pituitary lesion ([place]) 02/19/2014  . New onset of headaches 02/19/2014  . Other enthesopathy of unspecified foot 12/18/2013  . Pain in foot 12/18/2013  . Dupuytren's contracture of foot 12/18/2013  . Plantar fasciitis 12/18/2013  . Calcaneal bursitis 12/18/2013  . Plantar fascial fibromatosis 12/18/2013    Past Surgical History:  Past Surgical History:  Procedure  Laterality Date  . COLONOSCOPY    . CYSTOSCOPY  05/10/2016   WNL  . ESOPHAGOGASTRODUODENOSCOPY (EGD) WITH PROPOFOL N/A 02/18/2016   Procedure: ESOPHAGOGASTRODUODENOSCOPY (EGD) WITH PROPOFOL;  Surgeon: Lucilla Lame, MD;  Location: [facility];  Service: Endoscopy;  Laterality: N/A;  . TONSILLECTOMY  2017  . TONSILLECTOMY      Family History:  Family History  Problem Relation Age of Onset  . Diabetes Mother   . Hypertension Mother   . Congestive Heart Failure Mother   . CVA Father        Had meningioma  . Diabetes Father   . Hypertension Father   . Bladder Cancer Paternal Aunt   . Colon cancer Neg Hx   . Esophageal cancer Neg Hx   . Stomach cancer Neg Hx   . Rectal cancer Neg Hx     Social History:  Social History   Socioeconomic History  . Marital status: Single    Spouse name: Not on file  . Number of children: 0  . Years of education: 56  . Highest education level: Not on file  Occupational History  . Occupation: Chemical engineer  Tobacco Use  . Smoking status: Never Smoker  . Smokeless tobacco: Never Used  Vaping Use  . Vaping Use: Never used  Substance and Sexual Activity  . Alcohol use: No  . Drug use: No  . Sexual activity: Yes    Birth control/protection: None  Other Topics Concern  . Not on file  Social History Narrative  . Not on file   Social Determinants of Health   Financial Resource Strain:   . Difficulty of Paying Living Expenses:   Food Insecurity:   . Worried About Charity fundraiser in the Last Year:   . Arboriculturist in the Last Year:   Transportation Needs:   . Film/video editor (Medical):   Marland Kitchen Lack of Transportation (Non-Medical):   Physical Activity:   . Days of Exercise per Week:   . Minutes of Exercise per Session:   Stress:   . Feeling of Stress :   Social Connections:   . Frequency of Communication with Friends and Family:   . Frequency of Social Gatherings with Friends and Family:   . Attends Religious  Services:   . Active Member of Clubs or Organizations:   . Attends Archivist Meetings:   Marland Kitchen Marital Status:   Intimate Partner Violence:   . Fear of Current or Ex-Partner:   . Emotionally Abused:   Marland Kitchen Physically Abused:   . Sexually Abused:     Allergies:  No Known Allergies  Medications: Prior to Admission medications   Medication Sig Start Date End Date Taking? Authorizing Provider  terconazole (TERAZOL 3) 0.8 % vaginal cream Place 1 applicator vaginally at bedtime. 03/24/20  Yes Rod Can, CNM  Physical Exam Vitals: Blood pressure 120/70, height 5\' 2"  (1.575 m), weight (!) 225 lb (102.1 kg), last menstrual period 01/11/2020. Body mass index is 41.15 kg/m.  General: NAD HEENT: normocephalic, anicteric Pulmonary: No increased work of breathing Cardiovascular: RRR, distal pulses 2+ Abdomen: soft, non-tender, non-distended Genitourinary: deferred Extremities: no edema, erythema, or tenderness Neurologic: Grossly intact Psychiatric: mood appropriate, affect full  Imaging [place] MFM OB COMPLETE LESS THAN 14 WEEKS  Result Date: 04/06/2020 ----------------------------------------------------------------------  OBSTETRICS REPORT                       (Signed Final 04/06/2020 04:32 pm) ---------------------------------------------------------------------- Patient Info  ID #:       [id number]                          D.O.B.:  07/22/1986 (34 yrs)  Name:       Shannon Foley               Visit Date: 04/06/2020 03:50 pm ---------------------------------------------------------------------- Performed By  Attending:        Johnell Comings MD         Referred By:       Rod Can  Performed By:     Christena Deem             [facility]                                                              [facility] [place]  ---------------------------------------------------------------------- Orders  #  Description                           Code        Ordered By  1  [place] MFM OB COMP LESS THAN              76801.4     CHRISTANNA     [place] ----------------------------------------------------------------------  #  Order #                     Accession #  Episode #  1  [id number]                   [id number]                 [id number] ---------------------------------------------------------------------- Indications  Abnormal biochemical screen (quad) for          O28.9  Trisomy 33  Advanced maternal age multigravida 18+,         O56.521  first trimester  Weeks of gestation of pregnancy not             Z3A.00  specified ---------------------------------------------------------------------- Fetal Evaluation  Num Of Fetuses:          1  Cardiac Activity:        Absent  Presentation:            Cephalic  Placenta:                Anterior ---------------------------------------------------------------------- Biometry  CRL:        32  mm     G. Age:  9w 6d                    EDD:   11/03/20 ---------------------------------------------------------------------- Comments  This patient was seen for a first trimester ultrasound due to  advanced maternal age and as her cell free DNA test  indicated an increased risk for trisomy 78.  This is her first  pregnancy.  She denies any significant past medical history  and denies any problems in her current pregnancy.  The  patient has already received extensive genetic counseling  due to her positive cell free DNA test.  On today's exam, a fetal demise was noted.  A fetal  heartbeat was not present.  The crown-rump length  measured today is consistent with 10 weeks, indicating that  the miscarriage most likely occurred 1 to 2 weeks ago.  The patient was advised that there was nothing that she did  to have caused the miscarriage.  She was  advised that  miscarriages frequently occur in fetuses with Down  syndrome.  Management options for a miscarriage including a D&C  procedure and induction using Cytotec were discussed with  the patient.  She will discuss the type of procedure she wants  with you during her office visit tomorrow.  The products of conception should be sent for the [facility]  test to determine if the fetus had Down syndrome.  The  patient was reassured that she will most likely have a  successful pregnancy outcome should she try again in the  near future. ----------------------------------------------------------------------                   Johnell Comings, MD Electronically Signed Final Report   04/06/2020 04:32 pm ----------------------------------------------------------------------   Assessment: 34 y.o. G1P0000 presenting for scheduled D&C  Plan: 1)  Condolences were offered to the patient and her family.  I stressed that while emotionally difficult, that this did not occur because of an actions or inactions by the patient.  Somewhere between 10-20% of identified first trimester pregnancies will unfortunately end in miscarriage.  Given this relatively high incidence rate, further diagnostic testing such as chromosome analysis is generally not clinically relevant nor recommended.  Although the chromosomal abnormalities have been implicated at rates as high as 70% in some studies, these are generally random and do not infer and increased risk of recurrence with subsequent pregnancies.  However, 3 or more consecutive first trimester losses  are relatively uncommon, and these patient generally do benefit from additional work up to determine a potential modifiable etiology.   We briefly discussed management options including expectant management, medical management, and surgical management as well as their relative success rates and complications. Approximately 80% of first trimester miscarriages will pass successfully but may require a  time frame of up to 8 weeks ([place] 150 May 2015 "Early Pregnancy Loss").  Medical management using 856mcg of misoprostil administered every 3-hrs as needed for up to 3 doses speeds up the time frame to completion significantly, has literature supporting its use up to 63 days or [redacted]w[redacted]d gestation and results in a passage rate of 84-85% (ACOG Practice Bulletin 143 March 2014 "Medical Management of First-Trimester Abortion").  Dilation and curettage has the highest rate of uterine evacuation, but carries with is operative cost, surgical and anesthetic risk.  While these risk are relatively small they nevertheless include infection, bleeding, uterine perforation, formation of uterine synechia, and in rare cases death.   We discussed repeat ultrasound and or trending HCG levels if the patient wishes to pursue these prior to making her decision.  Clinically I am confident of the diagnosis, but I do not want any doubts in the patient's mind regarding the plan of management she chooses to adopt.  I will allow the patient and her family to discuss management options and she was advised to contact the office to arrange final disposition one she has made her decision or should she have any follow up questions for myself.    The patient is O POS  and rhogam is therefore not indicated.   - opts to proceed with suction D&C   2) Routine postoperative instructions were reviewed with the patient and her family in detail today including the expected length of recovery and likely postoperative course.  The patient concurred with the proposed plan, giving informed written consent for the surgery today.  Patient instructed on the importance of being NPO after midnight prior to her procedure.  If warranted preoperative prophylactic antibiotics and SCDs ordered on call to the OR to meet SCIP guidelines and adhere to recommendation laid forth in [place] Number 104 May 2009  "Antibiotic Prophylaxis for  Gynecologic Procedures".     [name], MD, [facility], [facility] 04/12/2020, 10:40 PM

## 2020-04-12 NOTE — Progress Notes (Signed)
Obstetrics & Gynecology Surgery H&P    Chief Complaint: Scheduled Surgery   History of Present Illness: Patient is a 34 y.o. G1P0000 presenting for scheduled suction D&C for the treatment or further evaluation of [redacted]w[redacted]d CRL missed abortion in setting of prior MaterniT21 test results positive for trisomy 21.   Prior Treatments prior to proceeding with surgery include: ultrasound confirmation of missed abortion  Preoperative Pap: 02/11/2020 Results: NILM HPV negative  Preoperative Endometrial biopsy: N/A Preoperative Ultrasound: 04/06/2020 Findings: missed abortion at [redacted]w[redacted]d   Review of Systems:10 point review of systems  Past Medical History:  Patient Active Problem List   Diagnosis Date Noted  . Abnormal biochemical finding on antenatal screening of mother 04/01/2020  . Supervision of high risk pregnancy, antepartum 02/12/2020     Nursing Staff Provider  Office Location  [facility] Dating   LMP = 7 wk [place]  Language  English Anatomy US    Flu Vaccine   Genetic Screen  NIPS:   AFP:   First Screen:    TDaP vaccine    Hgb A1C or  GTT Early : Third trimester :   Rhogam   not needed   LAB RESULTS   Feeding Plan  Blood Type O/Positive/-- (05/26 1604)   Contraception  Antibody Negative (05/26 1604)  Circumcision  Rubella 1.57 (05/26 1604)  Pediatrician   RPR Non Reactive (05/26 1604)   Support Person  HBsAg Negative (05/26 1604)   Prenatal Classes  HIV Non Reactive (05/26 1604)    Varicella     BTL Consent  GBS  (For PCN allergy, check sensitivities)        VBAC Consent  Pap      Hgb Electro      CF      SMA            . Advanced maternal age in multigravida, first trimester 02/12/2020  . Obesity affecting pregnancy in first trimester 02/12/2020  . Vaginal candida 03/18/2019  . CIN I (cervical intraepithelial neoplasia I) 02/13/2019    On 02/2017 colposcopy   . Acute otitis media 01/06/2019  . Acute upper respiratory infection 12/08/2018  . Fever and chills 12/08/2018  .  Sore throat 12/08/2018  . Hiatal hernia 06/23/2018  . Left hip pain 06/23/2018  . Needs flu shot 06/23/2018  . Uterine fibroid 06/13/2018    Fibroid 1: submucosal, on the Left posterior uterine wall = 33.58 x 31.93 mm Fibroid 2: Intramural vs subserosal, on the fudal Left uterine wall = 30.58 x 26.43 mm Fibroid 3: intramural vs Subserosal, on the fundal uterine wall = 6.81 x 5.92 mm   . IBS (irritable bowel syndrome) 05/29/2018  . Low grade squamous intraepithelial lesion (LGSIL) on cervical Pap smear 05/27/2018    With positive HRHPV   . Encounter for general adult medical examination with abnormal findings 04/03/2018  . Gastroesophageal reflux disease without esophagitis 04/03/2018  . Chronic midline low back pain without sciatica 04/03/2018  . Chronic midline thoracic back pain 04/03/2018  . Urinary tract infection without hematuria 04/03/2018  . Dysuria 04/03/2018  . Chest pain 01/08/2018  . Epigastric abdominal pain 01/08/2018  . Abnormal findings-gastrointestinal tract   . Rathke's cleft cyst ([place]) 02/02/2016  . Anemia 04/29/2014    Overview:  Diagnosed with both Fe defic and B12 defic per pt  Overview:  Diagnosed with both Fe defic and B12 defic per pt  Diagnosed with both Fe defic and B12 defic per pt Diagnosed with both Fe defic  and B12 defic per pt  Overview:  Diagnosed with both Fe defic and B12 defic per pt   . Fatigue 04/29/2014  . Hyperprolactinemia ([place]) 04/29/2014  . Vitamin B 12 deficiency 04/29/2014  . 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 Pituitary lesion ([place]) 02/19/2014  . New onset of headaches 02/19/2014  . Other enthesopathy of unspecified foot 12/18/2013  . Pain in foot 12/18/2013  . Dupuytren's contracture of foot 12/18/2013  . Plantar fasciitis 12/18/2013  . Calcaneal bursitis 12/18/2013  . Plantar fascial fibromatosis 12/18/2013    Past Surgical History:  Past Surgical History:  Procedure  Laterality Date  . COLONOSCOPY    . CYSTOSCOPY  05/10/2016   WNL  . ESOPHAGOGASTRODUODENOSCOPY (EGD) WITH PROPOFOL N/A 02/18/2016   Procedure: ESOPHAGOGASTRODUODENOSCOPY (EGD) WITH PROPOFOL;  Surgeon: Lucilla Lame, MD;  Location: [facility];  Service: Endoscopy;  Laterality: N/A;  . TONSILLECTOMY  2017  . TONSILLECTOMY      Family History:  Family History  Problem Relation Age of Onset  . Diabetes Mother   . Hypertension Mother   . Congestive Heart Failure Mother   . CVA Father        Had meningioma  . Diabetes Father   . Hypertension Father   . Bladder Cancer Paternal Aunt   . Colon cancer Neg Hx   . Esophageal cancer Neg Hx   . Stomach cancer Neg Hx   . Rectal cancer Neg Hx     Social History:  Social History   Socioeconomic History  . Marital status: Single    Spouse name: Not on file  . Number of children: 0  . Years of education: 5  . Highest education level: Not on file  Occupational History  . Occupation: Chemical engineer  Tobacco Use  . Smoking status: Never Smoker  . Smokeless tobacco: Never Used  Vaping Use  . Vaping Use: Never used  Substance and Sexual Activity  . Alcohol use: No  . Drug use: No  . Sexual activity: Yes    Birth control/protection: None  Other Topics Concern  . Not on file  Social History Narrative  . Not on file   Social Determinants of Health   Financial Resource Strain:   . Difficulty of Paying Living Expenses:   Food Insecurity:   . Worried About Charity fundraiser in the Last Year:   . Arboriculturist in the Last Year:   Transportation Needs:   . Film/video editor (Medical):   Marland Kitchen Lack of Transportation (Non-Medical):   Physical Activity:   . Days of Exercise per Week:   . Minutes of Exercise per Session:   Stress:   . Feeling of Stress :   Social Connections:   . Frequency of Communication with Friends and Family:   . Frequency of Social Gatherings with Friends and Family:   . Attends Religious  Services:   . Active Member of Clubs or Organizations:   . Attends Archivist Meetings:   Marland Kitchen Marital Status:   Intimate Partner Violence:   . Fear of Current or Ex-Partner:   . Emotionally Abused:   Marland Kitchen Physically Abused:   . Sexually Abused:     Allergies:  No Known Allergies  Medications: Prior to Admission medications   Medication Sig Start Date End Date Taking? Authorizing Provider  terconazole (TERAZOL 3) 0.8 % vaginal cream Place 1 applicator vaginally at bedtime. 03/24/20  Yes Rod Can, CNM  Physical Exam Vitals: Blood pressure 120/70, height 5\' 2"  (1.575 m), weight (!) 225 lb (102.1 kg), last menstrual period 01/11/2020. Body mass index is 41.15 kg/m.  General: NAD HEENT: normocephalic, anicteric Pulmonary: No increased work of breathing Cardiovascular: RRR, distal pulses 2+ Abdomen: soft, non-tender, non-distended Genitourinary: deferred Extremities: no edema, erythema, or tenderness Neurologic: Grossly intact Psychiatric: mood appropriate, affect full  Imaging [place] MFM OB COMPLETE LESS THAN 14 WEEKS  Result Date: 04/06/2020 ----------------------------------------------------------------------  OBSTETRICS REPORT                       (Signed Final 04/06/2020 04:32 pm) ---------------------------------------------------------------------- Patient Info  ID #:       [id number]                          D.O.B.:  April 18, 1986 (34 yrs)  Name:       Shannon Foley               Visit Date: 04/06/2020 03:50 pm ---------------------------------------------------------------------- Performed By  Attending:        Johnell Comings MD         Referred By:       Rod Can  Performed By:     Christena Deem             [facility]                                                              [facility] [place]  ---------------------------------------------------------------------- Orders  #  Description                           Code        Ordered By  1  [place] MFM OB COMP LESS THAN              76801.4     CHRISTANNA     [place] ----------------------------------------------------------------------  #  Order #                     Accession #  Episode #  1  [id number]                   [id number]                 [id number] ---------------------------------------------------------------------- Indications  Abnormal biochemical screen (quad) for          O28.9  Trisomy 59  Advanced maternal age multigravida 47+,         O2.521  first trimester  Weeks of gestation of pregnancy not             Z3A.00  specified ---------------------------------------------------------------------- Fetal Evaluation  Num Of Fetuses:          1  Cardiac Activity:        Absent  Presentation:            Cephalic  Placenta:                Anterior ---------------------------------------------------------------------- Biometry  CRL:        32  mm     G. Age:  9w 6d                    EDD:   11/03/20 ---------------------------------------------------------------------- Comments  This patient was seen for a first trimester ultrasound due to  advanced maternal age and as her cell free DNA test  indicated an increased risk for trisomy 60.  This is her first  pregnancy.  She denies any significant past medical history  and denies any problems in her current pregnancy.  The  patient has already received extensive genetic counseling  due to her positive cell free DNA test.  On today's exam, a fetal demise was noted.  A fetal  heartbeat was not present.  The crown-rump length  measured today is consistent with 10 weeks, indicating that  the miscarriage most likely occurred 1 to 2 weeks ago.  The patient was advised that there was nothing that she did  to have caused the miscarriage.  She was  advised that  miscarriages frequently occur in fetuses with Down  syndrome.  Management options for a miscarriage including a D&C  procedure and induction using Cytotec were discussed with  the patient.  She will discuss the type of procedure she wants  with you during her office visit tomorrow.  The products of conception should be sent for the [facility]  test to determine if the fetus had Down syndrome.  The  patient was reassured that she will most likely have a  successful pregnancy outcome should she try again in the  near future. ----------------------------------------------------------------------                   Johnell Comings, MD Electronically Signed Final Report   04/06/2020 04:32 pm ----------------------------------------------------------------------   Assessment: 34 y.o. G1P0000 presenting for scheduled D&C  Plan: 1)  Condolences were offered to the patient and her family.  I stressed that while emotionally difficult, that this did not occur because of an actions or inactions by the patient.  Somewhere between 10-20% of identified first trimester pregnancies will unfortunately end in miscarriage.  Given this relatively high incidence rate, further diagnostic testing such as chromosome analysis is generally not clinically relevant nor recommended.  Although the chromosomal abnormalities have been implicated at rates as high as 70% in some studies, these are generally random and do not infer and increased risk of recurrence with subsequent pregnancies.  However, 3 or more consecutive first trimester losses  are relatively uncommon, and these patient generally do benefit from additional work up to determine a potential modifiable etiology.   We briefly discussed management options including expectant management, medical management, and surgical management as well as their relative success rates and complications. Approximately 80% of first trimester miscarriages will pass successfully but may require a  time frame of up to 8 weeks ([place] 150 May 2015 "Early Pregnancy Loss").  Medical management using 88mcg of misoprostil administered every 3-hrs as needed for up to 3 doses speeds up the time frame to completion significantly, has literature supporting its use up to 63 days or [redacted]w[redacted]d gestation and results in a passage rate of 84-85% (ACOG Practice Bulletin 143 March 2014 "Medical Management of First-Trimester Abortion").  Dilation and curettage has the highest rate of uterine evacuation, but carries with is operative cost, surgical and anesthetic risk.  While these risk are relatively small they nevertheless include infection, bleeding, uterine perforation, formation of uterine synechia, and in rare cases death.   We discussed repeat ultrasound and or trending HCG levels if the patient wishes to pursue these prior to making her decision.  Clinically I am confident of the diagnosis, but I do not want any doubts in the patient's mind regarding the plan of management she chooses to adopt.  I will allow the patient and her family to discuss management options and she was advised to contact the office to arrange final disposition one she has made her decision or should she have any follow up questions for myself.    The patient is O POS  and rhogam is therefore not indicated.   - opts to proceed with suction D&C   2) Routine postoperative instructions were reviewed with the patient and her family in detail today including the expected length of recovery and likely postoperative course.  The patient concurred with the proposed plan, giving informed written consent for the surgery today.  Patient instructed on the importance of being NPO after midnight prior to her procedure.  If warranted preoperative prophylactic antibiotics and SCDs ordered on call to the OR to meet SCIP guidelines and adhere to recommendation laid forth in [place] Number 104 May 2009  "Antibiotic Prophylaxis for  Gynecologic Procedures".     [name], MD, [facility], [facility] 04/12/2020, 10:40 PM

## 2020-04-12 NOTE — Telephone Encounter (Signed)
Shannon Foley was informed of the results of her recent hemoglobinopathy screening which showed normal adult hemoglobin (AA).  These results show that she is not a carrier for sickle cell disease or other hemoglobin variant.    I also offered condolences regarding this pregnancy loss and reminded her that if chromosome testing is desired at the time of her D&E procedure, to speak with her provider and make that request.  We may be reached at [phone] with any questions or concerns.  Wilburt Finlay, MS, CGC

## 2020-04-12 NOTE — Patient Instructions (Addendum)
Your procedure is scheduled on: 04/13/20 Report to [facility]. To find out your arrival time please call [phone] between 1 PM - 3 PM on 04/12/20.  Remember: Instructions that are not followed completely may result in serious medical risk, up to and including death, or upon the discretion of your surgeon and anesthesiologist your surgery may need to be rescheduled.     _X__ 1. Do not eat food after midnight the night before your procedure.                 No gum chewing or hard candies. You may drink clear liquids up to 2 hours                 before you are scheduled to arrive for your surgery- DO not drink clear                 liquids within 2 hours of the start of your surgery.                 Clear Liquids include:  water, apple juice without pulp, clear carbohydrate                 drink such as Clearfast or Gatorade, Black Coffee or Tea (Do not add                 anything to coffee or tea). Diabetics water only  __X__2.  On the morning of surgery brush your teeth with toothpaste and water, you                 may rinse your mouth with mouthwash if you wish.  Do not swallow any              toothpaste of mouthwash.     _X__ 3.  No Alcohol for 24 hours before or after surgery.   _X__ 4.  Do Not Smoke or use e-cigarettes For 24 Hours Prior to Your Surgery.                 Do not use any chewable tobacco products for at least 6 hours prior to                 surgery.  ____  5.  Bring all medications with you on the day of surgery if instructed.   __X__  6.  Notify your doctor if there is any change in your medical condition      (cold, fever, infections).     Do not wear jewelry, make-up, hairpins, clips or nail polish. Do not wear lotions, powders, or perfumes.  Do not shave 48 hours prior to surgery. Men may shave face and neck. Do not bring valuables to the hospital.    [facility] is not responsible for any belongings  or valuables.  Contacts, dentures/partials or body piercing's may not be worn into surgery. Bring a case for your contacts, glasses or hearing aids, a denture cup will be supplied. Leave your suitcase in the car. After surgery it may be brought to your room. For patients admitted to the hospital, discharge time is determined by your treatment team.   Patients discharged the day of surgery will not be allowed to drive home.   Please read over the following fact sheets that you were given:   MRSA Information  __X__ Take these medicines the morning of surgery with A SIP OF WATER:  1. none  2.   3.   4.  5.  6.  ____ Fleet Enema (as directed)   ____ Use CHG Soap/SAGE wipes as directed  ____ Use inhalers on the day of surgery  ____ Stop metformin/Janumet/Farxiga 2 days prior to surgery    ____ Take 1/2 of usual insulin dose the night before surgery. No insulin the morning          of surgery.   ____ Stop Blood Thinners Coumadin/Plavix/Xarelto/Pleta/Pradaxa/Eliquis/Effient/Aspirin  on   Or contact your Surgeon, Cardiologist or Medical Doctor regarding  ability to stop your blood thinners  __X__ Stop Anti-inflammatories 7 days before surgery such as Advil, Ibuprofen, Motrin,  BC or Goodies Powder, Naprosyn, Naproxen, Aleve, Aspirin    __X__ Stop all herbal supplements, fish oil or vitamin E until after surgery.    ____ Bring C-Pap to the hospital.      Verbal instructions given via telephone patient verbalized understanding. She was notified instructions would be available for review in her MyChart acct.

## 2020-04-13 ENCOUNTER — Ambulatory Visit
Admission: RE | Admit: 2020-04-13 | Discharge: 2020-04-13 | Disposition: A | Discharge status: Home / Self Care | Payer: BC Managed Care – PPO | Attending: Obstetrics and Gynecology | Admitting: Obstetrics and Gynecology

## 2020-04-13 ENCOUNTER — Encounter: Payer: Self-pay | Admitting: Obstetrics and Gynecology

## 2020-04-13 ENCOUNTER — Encounter
Admission: RE | Disposition: A | Discharge status: Home / Self Care | Payer: Self-pay | Source: Home / Self Care | Attending: Obstetrics and Gynecology

## 2020-04-13 ENCOUNTER — Other Ambulatory Visit: Payer: Self-pay

## 2020-04-13 ENCOUNTER — Ambulatory Visit: Payer: BC Managed Care – PPO | Admitting: Anesthesiology

## 2020-04-13 DIAGNOSIS — Q909 Down syndrome, unspecified: Secondary | ICD-10-CM | POA: Diagnosis not present

## 2020-04-13 DIAGNOSIS — E669 Obesity, unspecified: Secondary | ICD-10-CM | POA: Diagnosis not present

## 2020-04-13 DIAGNOSIS — Z3A09 9 weeks gestation of pregnancy: Secondary | ICD-10-CM | POA: Insufficient documentation

## 2020-04-13 DIAGNOSIS — O0991 Supervision of high risk pregnancy, unspecified, first trimester: Secondary | ICD-10-CM | POA: Insufficient documentation

## 2020-04-13 DIAGNOSIS — O99211 Obesity complicating pregnancy, first trimester: Secondary | ICD-10-CM | POA: Diagnosis not present

## 2020-04-13 DIAGNOSIS — D649 Anemia, unspecified: Secondary | ICD-10-CM | POA: Diagnosis not present

## 2020-04-13 DIAGNOSIS — Z8619 Personal history of other infectious and parasitic diseases: Secondary | ICD-10-CM | POA: Diagnosis not present

## 2020-04-13 DIAGNOSIS — K219 Gastro-esophageal reflux disease without esophagitis: Secondary | ICD-10-CM | POA: Diagnosis not present

## 2020-04-13 DIAGNOSIS — O021 Missed abortion: Secondary | ICD-10-CM

## 2020-04-13 HISTORY — PX: DILATION AND EVACUATION: SHX1459

## 2020-04-13 LAB — TYPE AND SCREEN
ABO/RH(D): O POS
Antibody Screen: NEGATIVE

## 2020-04-13 SURGERY — DILATION AND EVACUATION, UTERUS
Anesthesia: General

## 2020-04-13 MED ORDER — HYDROCODONE-ACETAMINOPHEN 5-325 MG PO TABS
ORAL_TABLET | ORAL | Status: AC
  Filled 2020-04-13: qty 1

## 2020-04-13 MED ORDER — DOXYCYCLINE HYCLATE 100 MG IV SOLR
200.0000 mg | INTRAVENOUS | Status: AC
  Administered 2020-04-13: 200 mg via INTRAVENOUS
  Filled 2020-04-13: qty 200

## 2020-04-13 MED ORDER — ACETAMINOPHEN 10 MG/ML IV SOLN
INTRAVENOUS | Status: AC
  Filled 2020-04-13: qty 100

## 2020-04-13 MED ORDER — ORAL CARE MOUTH RINSE: 15.0000 mL | Freq: Once | OROMUCOSAL | Status: AC

## 2020-04-13 MED ORDER — POVIDONE-IODINE 10 % EX SWAB: 2.0000 "application " | Freq: Once | CUTANEOUS | Status: DC

## 2020-04-13 MED ORDER — IBUPROFEN 600 MG PO TABS
600.0000 mg | ORAL_TABLET | Freq: Four times a day (QID) | ORAL | 3 refills | Status: DC | PRN
Start: 2020-04-13 — End: 2020-06-09

## 2020-04-13 MED ORDER — PROPOFOL 10 MG/ML IV BOLUS
INTRAVENOUS | Status: AC
  Filled 2020-04-13: qty 20

## 2020-04-13 MED ORDER — METHYLERGONOVINE MALEATE 0.2 MG/ML IJ SOLN
INTRAMUSCULAR | Status: DC | PRN
  Administered 2020-04-13: .2 mg via INTRAMUSCULAR

## 2020-04-13 MED ORDER — ONDANSETRON HCL 4 MG/2ML IJ SOLN
INTRAMUSCULAR | Status: DC | PRN
  Administered 2020-04-13 (×2): 4 mg via INTRAVENOUS

## 2020-04-13 MED ORDER — MIDAZOLAM HCL 2 MG/2ML IJ SOLN
INTRAMUSCULAR | Status: DC | PRN
  Administered 2020-04-13: 2 mg via INTRAVENOUS

## 2020-04-13 MED ORDER — LACTATED RINGERS IV SOLN
INTRAVENOUS | Status: DC | PRN
Start: 2020-04-13 — End: 2020-04-13

## 2020-04-13 MED ORDER — LIDOCAINE HCL (CARDIAC) PF 100 MG/5ML IV SOSY
PREFILLED_SYRINGE | INTRAVENOUS | Status: DC | PRN
  Administered 2020-04-13: 100 mg via INTRAVENOUS

## 2020-04-13 MED ORDER — DEXAMETHASONE SODIUM PHOSPHATE 10 MG/ML IJ SOLN
INTRAMUSCULAR | Status: DC | PRN
  Administered 2020-04-13: 10 mg via INTRAVENOUS

## 2020-04-13 MED ORDER — METHYLERGONOVINE MALEATE 0.2 MG/ML IJ SOLN
INTRAMUSCULAR | Status: AC
  Filled 2020-04-13: qty 1

## 2020-04-13 MED ORDER — CHLORHEXIDINE GLUCONATE 0.12 % MT SOLN
OROMUCOSAL | Status: AC
  Administered 2020-04-13: 15 mL via OROMUCOSAL
  Filled 2020-04-13: qty 15

## 2020-04-13 MED ORDER — HYDROCODONE-ACETAMINOPHEN 5-325 MG PO TABS: 1.0000 | ORAL_TABLET | ORAL | 0 refills | Status: DC | PRN

## 2020-04-13 MED ORDER — MIDAZOLAM HCL 2 MG/2ML IJ SOLN
INTRAMUSCULAR | Status: AC
  Filled 2020-04-13: qty 2

## 2020-04-13 MED ORDER — CHLORHEXIDINE GLUCONATE 0.12 % MT SOLN: 15.0000 mL | Freq: Once | OROMUCOSAL | Status: AC

## 2020-04-13 MED ORDER — FAMOTIDINE 20 MG PO TABS
ORAL_TABLET | ORAL | Status: AC
  Administered 2020-04-13: 20 mg via ORAL
  Filled 2020-04-13: qty 1

## 2020-04-13 MED ORDER — ONDANSETRON HCL 4 MG/2ML IJ SOLN
4.0000 mg | Freq: Once | INTRAMUSCULAR | Status: AC | PRN
  Administered 2020-04-13: 4 mg via INTRAVENOUS

## 2020-04-13 MED ORDER — PROPOFOL 10 MG/ML IV BOLUS
INTRAVENOUS | Status: DC | PRN
  Administered 2020-04-13: 200 mg via INTRAVENOUS

## 2020-04-13 MED ORDER — DEXMEDETOMIDINE HCL IN NACL 80 MCG/20ML IV SOLN
INTRAVENOUS | Status: AC
  Filled 2020-04-13: qty 20

## 2020-04-13 MED ORDER — FAMOTIDINE 20 MG PO TABS: 20.0000 mg | ORAL_TABLET | Freq: Once | ORAL | Status: AC

## 2020-04-13 MED ORDER — FENTANYL CITRATE (PF) 100 MCG/2ML IJ SOLN
25.0000 ug | INTRAMUSCULAR | Status: DC | PRN
  Administered 2020-04-13 (×2): 25 ug via INTRAVENOUS

## 2020-04-13 MED ORDER — KETOROLAC TROMETHAMINE 30 MG/ML IJ SOLN
INTRAMUSCULAR | Status: AC
  Filled 2020-04-13: qty 1

## 2020-04-13 MED ORDER — ACETAMINOPHEN 10 MG/ML IV SOLN
INTRAVENOUS | Status: DC | PRN
Start: 2020-04-13 — End: 2020-04-13
  Administered 2020-04-13: 1000 mg via INTRAVENOUS

## 2020-04-13 MED ORDER — DEXMEDETOMIDINE HCL IN NACL 200 MCG/50ML IV SOLN
INTRAVENOUS | Status: DC | PRN
  Administered 2020-04-13 (×2): 8 ug via INTRAVENOUS
  Administered 2020-04-13: 12 ug via INTRAVENOUS

## 2020-04-13 MED ORDER — LIDOCAINE HCL (PF) 2 % IJ SOLN
INTRAMUSCULAR | Status: AC
  Filled 2020-04-13: qty 5

## 2020-04-13 MED ORDER — FENTANYL CITRATE (PF) 100 MCG/2ML IJ SOLN
INTRAMUSCULAR | Status: DC | PRN
  Administered 2020-04-13 (×2): 50 ug via INTRAVENOUS

## 2020-04-13 MED ORDER — SUGAMMADEX SODIUM 500 MG/5ML IV SOLN
INTRAVENOUS | Status: AC
  Filled 2020-04-13: qty 5

## 2020-04-13 MED ORDER — FENTANYL CITRATE (PF) 100 MCG/2ML IJ SOLN
INTRAMUSCULAR | Status: AC
  Administered 2020-04-13: 25 ug via INTRAVENOUS
  Filled 2020-04-13: qty 2

## 2020-04-13 MED ORDER — DEXAMETHASONE SODIUM PHOSPHATE 10 MG/ML IJ SOLN
INTRAMUSCULAR | Status: AC
  Filled 2020-04-13: qty 1

## 2020-04-13 MED ORDER — ONDANSETRON HCL 4 MG/2ML IJ SOLN
INTRAMUSCULAR | Status: AC
  Filled 2020-04-13: qty 2

## 2020-04-13 MED ORDER — ROCURONIUM BROMIDE 10 MG/ML (PF) SYRINGE
PREFILLED_SYRINGE | INTRAVENOUS | Status: AC
  Filled 2020-04-13: qty 10

## 2020-04-13 MED ORDER — LACTATED RINGERS IV SOLN: INTRAVENOUS | Status: DC

## 2020-04-13 MED ORDER — ROCURONIUM BROMIDE 100 MG/10ML IV SOLN
INTRAVENOUS | Status: DC | PRN
  Administered 2020-04-13: 50 mg via INTRAVENOUS

## 2020-04-13 MED ORDER — FENTANYL CITRATE (PF) 100 MCG/2ML IJ SOLN
INTRAMUSCULAR | Status: AC
  Filled 2020-04-13: qty 2

## 2020-04-13 MED ORDER — HYDROCODONE-ACETAMINOPHEN 5-325 MG PO TABS
1.0000 | ORAL_TABLET | Freq: Once | ORAL | Status: AC
  Administered 2020-04-13: 1 via ORAL

## 2020-04-13 MED ORDER — SUGAMMADEX SODIUM 500 MG/5ML IV SOLN
INTRAVENOUS | Status: DC | PRN
  Administered 2020-04-13: 200 mg via INTRAVENOUS
  Administered 2020-04-13: 100 mg via INTRAVENOUS

## 2020-04-13 MED ORDER — ONDANSETRON HCL 4 MG/2ML IJ SOLN
INTRAMUSCULAR | Status: AC
  Filled 2020-04-13: qty 4

## 2020-04-13 SURGICAL SUPPLY — 19 items
CATH ROBINSON RED A/P 16FR (CATHETERS) ×3 IMPLANT
COVER WAND RF STERILE (DRAPES) ×3 IMPLANT
FILTER UTR ASPR SPEC (MISCELLANEOUS) ×1 IMPLANT
FLTR UTR ASPR SPEC (MISCELLANEOUS) ×3
GLOVE BIO SURGEON STRL SZ7 (GLOVE) ×3 IMPLANT
GOWN STRL REUS W/ TWL LRG LVL3 (GOWN DISPOSABLE) ×2 IMPLANT
GOWN STRL REUS W/TWL LRG LVL3 (GOWN DISPOSABLE) ×6
KIT BERKELEY 1ST TRIMESTER 3/8 (MISCELLANEOUS) ×3 IMPLANT
KIT TURNOVER CYSTO (KITS) ×3 IMPLANT
NS IRRIG 500ML POUR BTL (IV SOLUTION) ×3 IMPLANT
PACK DNC HYST (MISCELLANEOUS) ×3 IMPLANT
PAD OB MATERNITY 4.3X12.25 (PERSONAL CARE ITEMS) ×3 IMPLANT
PAD PREP 24X41 OB/GYN DISP (PERSONAL CARE ITEMS) ×3 IMPLANT
SET BERKELEY SUCTION TUBING (SUCTIONS) ×3 IMPLANT
TOWEL OR 17X26 4PK STRL BLUE (TOWEL DISPOSABLE) ×3 IMPLANT
VACURETTE 10 RIGID CVD (CANNULA) IMPLANT
VACURETTE 12 RIGID CVD (CANNULA) IMPLANT
VACURETTE 8 RIGID CVD (CANNULA) IMPLANT
VACURETTE 8MM F TIP (MISCELLANEOUS) ×3 IMPLANT

## 2020-04-13 NOTE — Op Note (Signed)
Preoperative Diagnosis: 1) 34 y.o. with missed abortion at 9 weeks 6 days  Postoperative Diagnosis: 1) 34 y.o. with missed abortion at 9 weeks 6 days  Operation Performed: Suction dilation and curettage  Indication: Missed abortion secondary to   Anesthesia: .General  Primary Surgeon: [name], MD  Assistant: none  Preoperative Antibiotics: none  Estimated Blood Loss: 200 mL  IV Fluids: 969mL crystaloid  Urine Output:: ~71mL straight cath  Drains or Tubes: none  Implants: none  Specimens Removed: Products of conception  Complications: none  Intraoperative Findings: Anteverted 10 week size uterus, mobile.  Cervix minimally dilated no bleeding.    Patient Condition: stable  Procedure in Detail:  Patient was taken to the operating room were she was administered general endotracheal anesthesia.  She was positioned in the dorsal lithotomy position utilizing Allen stirups, prepped and draped in the usual sterile fashion.  Uterus was noted to be 10 weeks in size, anteverted.   Prior to proceeding with the case a time out was performed.  Attention was turned to the patient's pelvis.  A red rubber catheter was used to empty the patient's bladder.  An operative speculum was placed to allow visualization of the cervix.  The anterior lip of the cervix was grasped with a single tooth tenaculum and the cervix was sequentially dilated using pratt dilators.  A size 8 flexible suction curette was then advanced to the uterine fundus.  Several passes were undertaken to clear the uterine contents.  Sharp curettage was then  performed nothing good uterine cry throughout the cavity.  A final pass of the suction curette was then undertaken.  The resulting specimen was sent to pathology.    The single tooth tenaculum was removed from the cervix.  The tenaculum sites and cervix were noted to be  Hemostatic before removing the operative speculum.  Sponge needle and instrument counts were corrects  times two.  The patient tolerated the procedure well and was taken to the recovery room in stable condition.

## 2020-04-13 NOTE — Discharge Instructions (Addendum)
Dilation and Curettage or Vacuum Curettage, Care After These instructions give you information about caring for yourself after your procedure. Your doctor may also give you more specific instructions. Call your doctor if you have any problems or questions after your procedure. Follow these instructions at home: Activity  Do not drive or use heavy machinery while taking prescription pain medicine.  For 24 hours after your procedure, avoid driving.  Take short walks often, followed by rest periods. Ask your doctor what activities are safe for you. After one or two days, you may be able to return to your normal activities.  Do not lift anything that is heavier than 10 lb (4.5 kg) until your doctor approves.  For at least 2 weeks, or as long as told by your doctor: ? Do not douche. ? Do not use tampons. ? Do not have sex. General instructions   Take over-the-counter and prescription medicines only as told by your doctor. This is very important if you take blood thinning medicine.  Do not take baths, swim, or use a hot tub until your doctor approves. Take showers instead of baths.  Wear compression stockings as told by your doctor.  It is up to you to get the results of your procedure. Ask your doctor when your results will be ready.  Keep all follow-up visits as told by your doctor. This is important. Contact a doctor if:  You have very bad cramps that get worse or do not get better with medicine.  You have very bad pain in your belly (abdomen).  You cannot drink fluids without throwing up (vomiting).  You get pain in a different part of the area between your belly and thighs (pelvis).  You have bad-smelling discharge from your vagina.  You have a rash. Get help right away if:  You are bleeding a lot from your vagina. A lot of bleeding means soaking more than one sanitary pad in an hour, for 2 hours in a row.  You have clumps of blood (blood clots) coming from your  vagina.  You have a fever or chills.  Your belly feels very tender or hard.  You have chest pain.  You have trouble breathing.  You cough up blood.  You feel dizzy.  You feel light-headed.  You pass out (faint).  You have pain in your neck or shoulder area. Summary  Take short walks often, followed by rest periods. Ask your doctor what activities are safe for you. After one or two days, you may be able to return to your normal activities.  Do not lift anything that is heavier than 10 lb (4.5 kg) until your doctor approves.  Do not take baths, swim, or use a hot tub until your doctor approves. Take showers instead of baths.  Contact your doctor if you have any symptoms of infection, like bad-smelling discharge from your vagina. This information is not intended to replace advice given to you by your health care provider. Make sure you discuss any questions you have with your health care provider. Document Revised: 08/17/2017 Document Reviewed: 05/22/2016 Elsevier Patient Education  2020 [employer] Inc[employer]   AMBULATORY SURGERY  DISCHARGE INSTRUCTIONS   1) The drugs that you were given will stay in your system until tomorrow so for the next 24 hours you should not:  A) Drive an automobile B) Make any legal decisions C) Drink any alcoholic beverage   2) You may resume regular meals tomorrow.  Today it is better to start with   liquids and gradually work up to solid foods.  You may eat anything you prefer, but it is better to start with liquids, then soup and crackers, and gradually work up to solid foods.   3) Please notify your doctor immediately if you have any unusual bleeding, trouble breathing, redness and pain at the surgery site, drainage, fever, or pain not relieved by medication.    4) Additional Instructions:        Please contact your physician with any problems or Same Day Surgery at [phone], Monday through Friday 6 am to 4 pm, or [place] at  [place] Main number at [phone]. 

## 2020-04-13 NOTE — Anesthesia Postprocedure Evaluation (Signed)
Anesthesia Post Note  Patient: Shannon Foley  Procedure(s) Performed: SUCTION DILATION AND CURETTAGE (N/A )  Patient location during evaluation: PACU Anesthesia Type: General Level of consciousness: awake and alert and oriented Pain management: pain level controlled Vital Signs Assessment: post-procedure vital signs reviewed and stable Respiratory status: spontaneous breathing, nonlabored ventilation and respiratory function stable Cardiovascular status: blood pressure returned to baseline and stable Postop Assessment: no signs of nausea or vomiting Anesthetic complications: no   No complications documented.   Last Vitals:  Vitals:   04/13/20 1432 04/13/20 1447  BP: 105/78 110/69  Pulse: 80 76  Resp: 21 22  Temp:    SpO2: 96% 97%    Last Pain:  Vitals:   04/13/20 1447  TempSrc:   PainSc: 7                  Sigurd Pugh

## 2020-04-13 NOTE — H&P (Signed)
Date of Initial H&P: 04/09/2020  History reviewed, patient examined, no change in status, stable for surgery.

## 2020-04-13 NOTE — Anesthesia Preprocedure Evaluation (Signed)
Anesthesia Evaluation  Patient identified by MRN, date of birth, ID band Patient awake    Reviewed: Allergy & Precautions, H&P , NPO status , Patient's Chart, lab work & pertinent test results, reviewed documented beta blocker date and time   Airway Mallampati: II  TM Distance: >3 FB Neck ROM: full    Dental  (+) Teeth Intact   Pulmonary neg pulmonary ROS,    Pulmonary exam normal        Cardiovascular Exercise Tolerance: Good negative cardio ROS Normal cardiovascular exam Rate:Normal     Neuro/Psych  Headaches, negative psych ROS   GI/Hepatic Neg liver ROS, hiatal hernia, GERD  Medicated,  Endo/Other  Morbid obesity  Renal/GU negative Renal ROS  negative genitourinary   Musculoskeletal   Abdominal   Peds  Hematology  (+) Blood dyscrasia, anemia ,   Anesthesia Other Findings   Reproductive/Obstetrics negative OB ROS                             Anesthesia Physical Anesthesia Plan  ASA: III  Anesthesia Plan: General ETT   Post-op Pain Management:    Induction:   PONV Risk Score and Plan: 4 or greater  Airway Management Planned:   Additional Equipment:   Intra-op Plan:   Post-operative Plan:   Informed Consent: I have reviewed the patients History and Physical, chart, labs and discussed the procedure including the risks, benefits and alternatives for the proposed anesthesia with the patient or authorized representative who has indicated his/her understanding and acceptance.       Plan Discussed with: CRNA  Anesthesia Plan Comments:         Anesthesia Quick Evaluation

## 2020-04-13 NOTE — Transfer of Care (Signed)
Immediate Anesthesia Transfer of Care Note  Patient: Shannon Foley  Procedure(s) Performed: SUCTION DILATION AND CURETTAGE (N/A )  Patient Location: PACU  Anesthesia Type:General  Level of Consciousness: awake, drowsy and responds to stimulation  Airway & Oxygen Therapy: Patient Spontanous Breathing and Patient connected to face mask oxygen  Post-op Assessment: Report given to RN and Post -op Vital signs reviewed and stable  Post vital signs: Reviewed and stable  Last Vitals:  Vitals Value Taken Time  BP 106/66 04/13/20 1417  Temp 35.9 C 04/13/20 1417  Pulse 81 04/13/20 1421  Resp 20 04/13/20 1421  SpO2 100 % 04/13/20 1421  Vitals shown include unvalidated device data.  Last Pain:  Vitals:   04/13/20 1002  TempSrc: Oral  PainSc: 0-No pain         Complications: No complications documented.

## 2020-04-13 NOTE — Anesthesia Procedure Notes (Addendum)
Procedure Name: Intubation Date/Time: 04/13/2020 1:36 PM Performed by: Justus Memory, CRNA Pre-anesthesia Checklist: Patient identified, Patient being monitored, Timeout performed, Emergency Drugs available and Suction available Patient Re-evaluated:Patient Re-evaluated prior to induction Oxygen Delivery Method: Circle system utilized Preoxygenation: Pre-oxygenation with 100% oxygen Induction Type: IV induction Ventilation: Mask ventilation without difficulty Laryngoscope Size: 3 and McGraph Grade View: Grade I Tube type: Oral Tube size: 7.0 mm Number of attempts: 1 Airway Equipment and Method: Stylet and Video-laryngoscopy Placement Confirmation: ETT inserted through vocal cords under direct vision,  positive ETCO2 and breath sounds checked- equal and bilateral Secured at: 21 cm Tube secured with: Tape Dental Injury: Teeth and Oropharynx as per pre-operative assessment  Difficulty Due To: Difficulty was anticipated and Difficult Airway- due to large tongue Future Recommendations: Recommend- induction with short-acting agent, and alternative techniques readily available

## 2020-04-14 ENCOUNTER — Encounter: Payer: Self-pay | Admitting: Obstetrics and Gynecology

## 2020-04-14 ENCOUNTER — Telehealth: Payer: Self-pay

## 2020-04-14 NOTE — Telephone Encounter (Signed)
Pt left msg on triage line asking if you can approve for her to return to work on 04/26/20. She says she needs more time.

## 2020-04-15 ENCOUNTER — Encounter: Payer: Self-pay | Admitting: Obstetrics and Gynecology

## 2020-04-15 LAB — SURGICAL PATHOLOGY

## 2020-04-15 NOTE — Telephone Encounter (Signed)
Letter is in she may access it on mychart

## 2020-04-15 NOTE — Telephone Encounter (Signed)
Pt aware.

## 2020-04-16 ENCOUNTER — Ambulatory Visit: Payer: Medicare Other | Admitting: Obstetrics and Gynecology

## 2020-04-22 ENCOUNTER — Ambulatory Visit (INDEPENDENT_AMBULATORY_CARE_PROVIDER_SITE_OTHER): Payer: BC Managed Care – PPO | Admitting: Obstetrics and Gynecology

## 2020-04-22 ENCOUNTER — Encounter: Payer: Self-pay | Admitting: Obstetrics and Gynecology

## 2020-04-22 ENCOUNTER — Other Ambulatory Visit: Payer: Self-pay

## 2020-04-22 VITALS — BP 121/78 | HR 84 | Wt 222.0 lb

## 2020-04-22 DIAGNOSIS — Z4889 Encounter for other specified surgical aftercare: Secondary | ICD-10-CM

## 2020-04-22 MED ORDER — HYDROCODONE-ACETAMINOPHEN 5-325 MG PO TABS: 1.0000 | ORAL_TABLET | ORAL | 0 refills | Status: DC | PRN

## 2020-04-22 NOTE — Progress Notes (Signed)
      Postoperative Follow-up Patient presents post op from suction D&C 1weeks ago for missed abortion [redacted] weeks gestation, T21 on MaterniT21.  Subjective: Patient reports some improvement in her preop symptoms. Eating a regular diet without difficulty. Pain is controlled with current analgesics. Medications being used: narcotic analgesics including vicodin.  Activity: normal activities of daily living.  Still having off an on bleeding and cramping.  Decreasing in intensity.  Objective: Blood pressure 121/78, pulse 84, weight 222 lb (100.7 kg), unknown if currently breastfeeding.  General: NAD, well nourished appears stated age Pulmonary: no increased work of breathing Extremities: no edema Neurologic: normal gait   Admission on 04/13/2020, Discharged on 04/13/2020  Component Date Value Ref Range Status  . ABO/RH(D) 04/13/2020 O POS   Final  . Antibody Screen 04/13/2020 NEG   Final  . Sample Expiration 04/13/2020    Final                   Value:04/16/2020,2359 Performed at [facility], [address], [place], [place] [address]   . SURGICAL PATHOLOGY 04/13/2020    Final-Edited                   Value:SURGICAL PATHOLOGY CASE: [id number] PATIENT: Steward Ros Surgical Pathology Report     Specimen Submitted: A. Products of conception  Clinical History: Missed abortion      DIAGNOSIS: A. PRODUCTS OF CONCEPTION; DILATION AND CURETTAGE: - FETAL TISSUE, CHORIONIC VILLI, AND DECIDUA. - NEGATIVE FOR MALIGNANCY.   GROSS DESCRIPTION: A. Labeled: Products of conception Received: Formalin Tissue fragment(s): Multiple Size: Aggregate, 10 x 5.5 x 2 cm Villous tissue: No distinct villous tissue is grossly appreciated. Fetal tissue: None grossly appreciated. Comment: Received in a clear plastic mesh container are fragments of red-brown soft tissue and blood clot.  Block summary (approximately 80% is submitted): 1 - 6 - representative soft tissue  fragments    Final Diagnosis performed by Betsy Pries, MD.   Electronically signed 04/15/2020 10:08:05AM The electronic signature indicates that the named Attending Pathologist has evaluated the specimen Te                         chnical component performed at [facility], [address] [place], [place] [address] Lab: [phone] Dir: Rush Farmer, MD, MMM  Professional component performed at [facility], [facility], [place], [place], [place] [address] Lab: [phone] Dir: Dellia Nims. Rubinas, MD     Assessment: 34 y.o. s/p suction D&C stable  Plan: Patient has done well after surgery with no apparent complications.  I have discussed the post-operative course to date, and the expected progress moving forward.  The patient understands what complications to be concerned about.  I will see the patient in routine follow up, or sooner if needed.    Activity plan: pelvic rest  Refill hydrocodone  POC microarray results pending   [name], MD, [place], [facility] 04/22/2020, 12:03 PM

## 2020-04-23 NOTE — Interval H&P Note (Signed)
History and Physical Interval Note:  04/23/2020 4:49 PM  Shannon Foley  has presented today for surgery, with the diagnosis of Missed abortion.  The various methods of treatment have been discussed with the patient and family. After consideration of risks, benefits and other options for treatment, the patient has consented to  Procedure(s): [place] (N/A) as a surgical intervention.  The patient's history has been reviewed, patient examined, no change in status, stable for surgery.  I have reviewed the patient's chart and labs.  Questions were answered to the patient's satisfaction.     [name]

## 2020-04-30 LAB — POC/TISSUE MICROARRAY

## 2020-05-03 ENCOUNTER — Ambulatory Visit: Payer: BC Managed Care – PPO | Attending: Internal Medicine

## 2020-05-03 DIAGNOSIS — Z23 Encounter for immunization: Secondary | ICD-10-CM

## 2020-05-03 NOTE — Progress Notes (Signed)
   Covid-19 Vaccination Clinic  Name:  KAYLISE BLAKELEY    MRN: [id number] DOB: 11/01/1985  05/03/2020  Ms. Munford was observed post Covid-19 immunization for 15 minutes without incident. She was provided with Vaccine Information Sheet and instruction to access the V-Safe system.   Ms. Deamer was instructed to call 911 with any severe reactions post vaccine: Marland Kitchen Difficulty breathing  . Swelling of face and throat  . A fast heartbeat  . A bad rash all over body  . Dizziness and weakness   Immunizations Administered    Name Date Dose VIS Date Route   Pfizer COVID-19 Vaccine 05/03/2020 10:26 AM 0.3 mL 11/12/2018 Intramuscular   Manufacturer: [employer], [employer]   Lot: [id number]   [place]: [id number]

## 2020-05-06 ENCOUNTER — Telehealth: Payer: Self-pay | Admitting: Obstetrics and Gynecology

## 2020-05-06 NOTE — Telephone Encounter (Signed)
I spoke with Ms. Truluck to review the results of the chromosome analysis from her recent pregnancy loss as well as offer repeat carrier screening for CF and SMA.  During her prior genetic counseling visit, she desired carrier screening for hemoglobinopathies, CF and SMA.  Hemoglobinopathy screening was normal.  Apparently the CF and SMA were not drawn.  I offered repeat, and she will contact our office if she would like to come back in for that.  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  This type of testing is very accurate for determining the number of copies of the genetic material, but cannot distinguish between Down syndrome due to trisomy or a translocation.  Therefore, parental chromosome analysis is recommended by the laboratory to assess the chance for a chromosome condition in a future pregnancy.  We reviewed that if trisomy 21 was present, this is a sporadic event and the recurrence would be 1% of her age related risk, whichever is higher.  However, if she or her partner were found to be a carrier of a translocation, then the risks may be higher.  We are happy to arrange a lab only visit at any time for this testing if desired. If she declines chromosome analysis, testing in a future pregnancy is still available. A formal genetic counseling visit can also be ordered prior to or during any future pregnancy to review this information.  Thank you for the opportunity to be involved in the care of this family. We may be reached at [phone].  Wilburt Finlay, MS, CGC

## 2020-05-19 ENCOUNTER — Telehealth: Payer: Self-pay

## 2020-05-19 NOTE — Telephone Encounter (Signed)
Lmom to confirm and screen for 05-21-20 ov.

## 2020-05-20 ENCOUNTER — Telehealth: Payer: Self-pay

## 2020-05-20 NOTE — Telephone Encounter (Signed)
Confirmed and screened for 05-25-20 ov.

## 2020-05-21 ENCOUNTER — Ambulatory Visit: Payer: BC Managed Care – PPO | Admitting: Nurse Practitioner

## 2020-05-25 ENCOUNTER — Ambulatory Visit: Payer: BC Managed Care – PPO | Admitting: Nurse Practitioner

## 2020-05-31 ENCOUNTER — Ambulatory Visit: Payer: BC Managed Care – PPO

## 2020-05-31 ENCOUNTER — Ambulatory Visit: Payer: BC Managed Care – PPO | Attending: Internal Medicine

## 2020-05-31 DIAGNOSIS — Z23 Encounter for immunization: Secondary | ICD-10-CM

## 2020-05-31 NOTE — Progress Notes (Signed)
   Covid-19 Vaccination Clinic  Name:  Shannon Foley    MRN: [id number] DOB: Jan 27, 1986  05/31/2020  [name] was observed post Covid-19 immunization for 15 minutes without incident. She was provided with Vaccine Information Sheet and instruction to access the V-Safe system.   [name] was instructed to call 911 with any severe reactions post vaccine: Marland Kitchen Difficulty breathing  . Swelling of face and throat  . A fast heartbeat  . A bad rash all over body  . Dizziness and weakness   Immunizations Administered    Name Date Dose VIS Date Route   Pfizer COVID-19 Vaccine 05/31/2020  2:14 PM 0.3 mL 11/12/2018 Intramuscular   Manufacturer: [employer], [employer]   Lot: [id number]   [place]: [id number]

## 2020-06-09 ENCOUNTER — Other Ambulatory Visit: Payer: Self-pay

## 2020-06-09 ENCOUNTER — Ambulatory Visit: Payer: Medicare Other | Admitting: Obstetrics and Gynecology

## 2020-06-09 ENCOUNTER — Ambulatory Visit (INDEPENDENT_AMBULATORY_CARE_PROVIDER_SITE_OTHER): Payer: BC Managed Care – PPO | Admitting: Obstetrics and Gynecology

## 2020-06-09 ENCOUNTER — Encounter: Payer: Self-pay | Admitting: Obstetrics and Gynecology

## 2020-06-09 VITALS — BP 129/83 | HR 94 | Ht 63.0 in | Wt 229.0 lb

## 2020-06-09 DIAGNOSIS — Z4889 Encounter for other specified surgical aftercare: Secondary | ICD-10-CM

## 2020-06-09 NOTE — Progress Notes (Signed)
Postoperative Follow-up Patient [id number] presents post op from suction D&C 6weeks ago for missed abortion T21.  Subjective: Patient reports marked improvement in her preop symptoms. Eating a regular diet without difficulty. The patient is not having any pain.  Activity: normal activities of daily living.  Objective: Blood pressure 129/83, pulse 94, height _0  (1.6 m), weight 229 lb (103.9 kg), last menstrual period 05/08/2020, unknown if currently breastfeeding.  General: NAD Pulmonary: no increased work of breathing GU: normal external female genitalia normal cervix, no CMT, uterus normal in shape and contour, no adnexal tenderness or masses Extremities: no edema Neurologic: normal gait   Admission on 04/13/2020, Discharged on 04/13/2020  Component Date Value Ref Range Status  . ABO/RH(D) 04/13/2020 O POS   Final  . Antibody Screen 04/13/2020 NEG   Final  . Sample Expiration 04/13/2020    Final                   Value:04/16/2020,2359 Performed at [facility], [address], [place], [place] [address]   . SURGICAL PATHOLOGY 04/13/2020    Final-Edited                   Value:SURGICAL PATHOLOGY CASE: [id number] PATIENT: Ste[facility] Ros Surgical Pathology Report     Specimen Submitted: A. Products of conception  Clinical History: Missed abortion      DIAGNOSIS: A. PRODUCTS OF CONCEPTION; DILATION AND CURETTAGE: - FETAL TISSUE, CHORIONIC VILLI, AND DECIDUA. - NEGATIVE FOR MALIGNANCY.   GROSS DESCRIPTION: A. Labeled: Products of conception Received: Formalin Tissue fragment(s): Multiple Size: Aggregate, 10 x 5.5 x 2 cm Villous tissue: No distinct villous tissue is grossly appreciated. Fetal tissue: None grossly appreciated. Comment: Received in a clear plastic mesh container are fragments of red-brown soft tissue and blood clot.  Block summary (approximately 80% is submitted): 1 - 6 - representative soft tissue fragments    Final  Diagnosis performed by Betsy Pries, MD.   Electronically signed 04/15/2020 10:08:05AM The electronic signature indicates that the named Attending Pathologist has evaluated the specimen Te                         chnical component performed at [facility], [address], [place], [place] [address] Lab: [phone] Dir: Rush Farmer, MD, MMM  Professional component performed at [facility], [facility], [place], [place], [place] [address] Lab: [phone] Dir: Dellia Nims. Rubinas, MD   . Specimen Type 04/13/2020 Comment:   Final   POC  . # of Genotyping Targets 04/13/2020 Comment:   Final   [id number]  . Array Type 04/13/2020 SNP   Final  . Diagnosis 04/13/2020 Comment:   Final   GAIN OF CHROMOSOME 21  . 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 This trisomy is most often not compatible with fetal viability. No admixture of maternal and fetal DNA was noted in this microarray analysis.         Parental chromosome analysis is recommended, if not already performed, to rule out an isochromosome or Robertsonian translocation with increased reproductive recurrence risk.  No other DNA copy number changes or copy neutral ROH were detected within the present reporting criteria. The recurrence risk for any non-translocation trisomy ascertained at birth may be as high as 1% for women age 54 and under. The recurrence risk is age related in women over 34 years (see references). Genetic counseling is recommended.    References:    Stene et al. Risk for chromosome abnormality at amniocentesis following a child with                           a non-inherited chromosome aberration. A European Collaborative Study on Prenatal Diagnoses 1981. Prenat Diagn. 1984  Spring;4 Spec No:81-95. PMID: 7622633.    Alcario Drought and Amor. Alcario Drought and [place] Chromosome Abnormalities and Genetic Counseling (5 ed.) [employer] 2018. Print ISBN-13: R9404511.    Methodology  SNP microarray analysis was performed using the Cytoscan HD platform which uses over 743,000 SNP probes and 1,953,000 NPCN probes with a median spacing of 0.88 kb. Total genomic DNA was extracted from sample type provided and digested with NspI and then ligated to NspI adaptors. PCR products were purified and quantified. Purified DNA was fragmented and biotin labeled and hybridized to the Cytoscan  HD GeneChip. Data was analyzed using Chromosome Analysis Suite. The analysis is based on the GRCh37/hg19 assembly..  This test was developed and its performance characteristics determined by [facility]. It has not been cleared or approved by the [facility]                          d and Drug administration.    Positive evaluation criteria include:  * DNA copy number loss of >1 Mb or gain >2 Mb outside known clinically significant regions with at least one OMIM gene. .  * DNA copy gain/loss within or including a known clinically significant gene of 25 Kb or greater. .  * DNA copy gain/loss of whole chromosomes with at least 10% fetal origin of the DNA tested..  * Maternal cell contamination [facility]) is detected by comparison of abnormal dosage allele combinations as well as normal dosage mixes of fetal and maternal alleles..  * UPD testing is recommended for patient results demonstrating a long contiguous region of homozygosity in a single chromosome of >20 Mb interstitially or >10 Mb telomerically (15 and 8 Mb, respectively, for imprinted chromosomes)..  * Contiguous homozygosity of >8 Mb within multiple chromosomes suggests common descent. These regions of potential recessive allele risk are designated..  * A high level of allele homozygosity d                          ue to  numerous contiguous short runs (associated with a geographically or socially limited gene pool) is reported at the 99th percentile..  * Complete moles are accurately detected by the presence of whole genome allele homozygosity ( 50% hmz in rare dispermy moles)..  * Triploid tissue that normalizes to 2 copies in standard array analysis, are detectable in this allele specific microarray by 2:1 heterozygote allele ratios generated within each chromosome .    Truly balanced chromosome alterations will not be detected by this analysis. The threshold for mosaicism is variable, depending on the size of segment. Empiric studies have detected whole chromosome 22 mosaicism below 10.0%. CNVs cited in the Database of Genomic Variants are not reported.   . Director Review: 04/13/2020 Comment:  Final   Comment: (NOTE) Rachael Darby, PhD., [facility] Performed At: [facility] [address] [place], [place] [id number] Theresa Duty MD RE:[id number] Performed At: [facility] [address] [place], [place] [id number] Katina Degree MDPhD VH:[id number]     Assessment: 34 y.o. s/p suction D&C stable  Plan: Patient has done well after surgery with no apparent complications.  I have discussed the post-operative course to date, and the expected progress moving forward.  The patient understands what complications to be concerned about.  I will see the patient in routine follow up, or sooner if needed.    Activity plan: No restriction.   [name], MD, [facility], [facility] 06/09/2020, 4:45 PM

## 2020-06-20 ENCOUNTER — Encounter: Payer: Self-pay | Admitting: Internal Medicine

## 2020-06-21 NOTE — Telephone Encounter (Signed)
Please discuss with dfk

## 2020-06-22 NOTE — Patient Instructions (Signed)
I value your feedback and entrusting us with your care. If you get a  patient survey, I would appreciate you taking the time to let us know about your experience today. Thank you!  As of August 28, 2019, your lab results will be released to your MyChart immediately, before I even have a chance to see them. Please give me time to review them and contact you if there are any abnormalities. Thank you for your patience.  

## 2020-06-22 NOTE — Progress Notes (Signed)
Shannon Guise, MD   Chief Complaint  Patient presents with  . Urinary Tract Infection    frequency, lower abdominal pain, back pain x 2 weeks    HPI:      Shannon Foley is a 34 y.o. G1P0010 whose LMP was Patient's last menstrual period was 06/10/2020 (exact date)., presents today for urinary frequency with good flow, mid back pain and BLQ sharp, stabbing pains for the past 2 wks. Has also had increased vag d/c with irritation, no fishy odor. No hematuria, fevers. No prior abx use, no meds to treat. Drinks caffeine, little water. Uses dove sens skin soap, no dryer sheets. No GI sx.  She is sex active, no new partners. No dyspareunia/bleeding.  S/p D&C for the treatment or further evaluation of [redacted]w[redacted]d CRL missed abortion in setting of prior MaterniT21 test results positive for trisomy 21 7/21 with Dr. Georgianne Foley.   Past Medical History:  Diagnosis Date  . Back pain    mid and lower back  . Chronic abdominal pain   . Chronic constipation   . Fibroids    one submucosal and one intramural (both  < 1 inch in size)  . Headache    caused by pituitary cyst  . IBS (irritable bowel syndrome)   . Motion sickness cars  . Obesity   . Pituitary cyst ([place])    Rathke's pouch cyst    Past Surgical History:  Procedure Laterality Date  . COLONOSCOPY    . CYSTOSCOPY  05/10/2016   WNL  . DILATION AND EVACUATION N/A 04/13/2020   Procedure: SUCTION DILATION AND CURETTAGE;  Surgeon: Shannon Mood, MD;  Location: [facility];  Service: Gynecology;  Laterality: N/A;  . ESOPHAGOGASTRODUODENOSCOPY (EGD) WITH PROPOFOL N/A 02/18/2016   Procedure: ESOPHAGOGASTRODUODENOSCOPY (EGD) WITH PROPOFOL;  Surgeon: Lucilla Lame, MD;  Location: [place];  Service: Endoscopy;  Laterality: N/A;  . TONSILLECTOMY  2017  . TONSILLECTOMY      Family History  Problem Relation Age of Onset  . Diabetes Mother   . Hypertension Mother   . Congestive Heart Failure Mother   . CVA Father        Had  meningioma  . Diabetes Father   . Hypertension Father   . Bladder Cancer Paternal Aunt   . Colon cancer Neg Hx   . Esophageal cancer Neg Hx   . Stomach cancer Neg Hx   . Rectal cancer Neg Hx     Social History   Socioeconomic History  . Marital status: Single    Spouse name: Not on file  . Number of children: 0  . Years of education: 39  . Highest education level: Not on file  Occupational History  . Occupation: Chemical engineer  Tobacco Use  . Smoking status: Never Smoker  . Smokeless tobacco: Never Used  Vaping Use  . Vaping Use: Never used  Substance and Sexual Activity  . Alcohol use: No  . Drug use: No  . Sexual activity: Yes    Birth control/protection: None  Other Topics Concern  . Not on file  Social History Narrative  . Not on file   Social Determinants of Health   Financial Resource Strain:   . Difficulty of Paying Living Expenses: Not on file  Food Insecurity:   . Worried About Charity fundraiser in the Last Year: Not on file  . Ran Out of Food in the Last Year: Not on file  Transportation Needs:   .  Lack of Transportation (Medical): Not on file  . Lack of Transportation (Non-Medical): Not on file  Physical Activity:   . Days of Exercise per Week: Not on file  . Minutes of Exercise per Session: Not on file  Stress:   . Feeling of Stress : Not on file  Social Connections:   . Frequency of Communication with Friends and Family: Not on file  . Frequency of Social Gatherings with Friends and Family: Not on file  . Attends Religious Services: Not on file  . Active Member of Clubs or Organizations: Not on file  . Attends Archivist Meetings: Not on file  . Marital Status: Not on file  Intimate Partner Violence:   . Fear of Current or Ex-Partner: Not on file  . Emotionally Abused: Not on file  . Physically Abused: Not on file  . Sexually Abused: Not on file    No outpatient medications prior to visit.   No facility-administered  medications prior to visit.      ROS:  Review of Systems  Constitutional: Negative for fever.  Gastrointestinal: Negative for blood in stool, constipation, diarrhea, nausea and vomiting.  Genitourinary: Positive for frequency, pelvic pain and vaginal discharge. Negative for dyspareunia, dysuria, flank pain, hematuria, urgency, vaginal bleeding and vaginal pain.  Musculoskeletal: Positive for back pain.  Skin: Negative for rash.   BREAST: No symptoms   OBJECTIVE:   Vitals:  BP 100/70   Ht 5\' 3"  (1.6 m)   Wt 232 lb (105.2 kg)   LMP 06/10/2020 (Exact Date)   Breastfeeding No   BMI 41.10 kg/m   Physical Exam Vitals reviewed.  Constitutional:      Appearance: She is well-developed.  Pulmonary:     Effort: Pulmonary effort is normal.  Abdominal:     Tenderness: There is abdominal tenderness in the suprapubic area. There is no right CVA tenderness, left CVA tenderness, guarding or rebound.  Genitourinary:    General: Normal vulva.     Pubic Area: No rash.      Labia:        Right: No rash, tenderness or lesion.        Left: No rash, tenderness or lesion.      Vagina: Normal. No vaginal discharge, erythema or tenderness.     Cervix: Normal.     Uterus: Normal. Not enlarged and not tender.      Adnexa: Left adnexa normal.       Right: Tenderness present. No mass.         Left: No mass or tenderness.    Musculoskeletal:        General: Normal range of motion.     Cervical back: Normal range of motion.  Skin:    General: Skin is warm and dry.  Neurological:     General: No focal deficit present.     Mental Status: She is alert and oriented to person, place, and time.  Psychiatric:        Foley and Affect: Foley normal.        Behavior: Behavior normal.        Thought Content: Thought content normal.        Judgment: Judgment normal.     Results: Results for orders placed or performed in visit on 06/23/20 (from the past 24 hour(s))  POCT Urinalysis Dipstick      Status: Abnormal   Collection Time: 06/23/20 11:54 AM  Result Value Ref Range   Color, UA yellow  Clarity, UA clear    Glucose, UA Negative Negative   Bilirubin, UA neg    Ketones, UA neg    Spec Grav, UA 1.025 1.010 - 1.025   Blood, UA neg    pH, UA 7.5 5.0 - 8.0   Protein, UA Negative Negative   Urobilinogen, UA     Nitrite, UA neg    Leukocytes, UA Trace (A) Negative   Appearance     Odor    POCT Wet Prep with KOH     Status: Normal   Collection Time: 06/23/20 11:54 AM  Result Value Ref Range   Trichomonas, UA Negative    Clue Cells Wet Prep HPF POC neg    Epithelial Wet Prep HPF POC     Yeast Wet Prep HPF POC neg    Bacteria Wet Prep HPF POC     RBC Wet Prep HPF POC     WBC Wet Prep HPF POC     KOH Prep POC Negative Negative     Assessment/Plan: UTI symptoms - Plan: POCT Urinalysis Dipstick, Urine Culture; Neg UA. Check C&S. D/C caffeine, increase water. Will f/u with results if pos.  Vaginal itching - Plan: POCT Wet Prep with KOH, fluconazole (DIFLUCAN) 150 MG tablet; neg wet prep/neg exam. Treat empirically with Rx diflucan. F/u prn.   Pelvic pain--slightly tender on exam. Rule out UTI with C&S. If neg, pt to f/u for GYN u/s if sx persist.   Meds ordered this encounter  Medications  . fluconazole (DIFLUCAN) 150 MG tablet    Sig: Take 1 tablet (150 mg total) by mouth once for 1 dose.    Dispense:  1 tablet    Refill:  0    Order Specific Question:   Supervising Provider    Answer:   Gae Dry [[id number]]      Return if symptoms worsen or fail to improve.  Kentarius Partington B. [place] Lapaglia, PA-C 06/23/2020 11:54 AM

## 2020-06-23 ENCOUNTER — Ambulatory Visit (INDEPENDENT_AMBULATORY_CARE_PROVIDER_SITE_OTHER): Payer: BC Managed Care – PPO | Admitting: Obstetrics and Gynecology

## 2020-06-23 ENCOUNTER — Encounter: Payer: Self-pay | Admitting: Obstetrics and Gynecology

## 2020-06-23 ENCOUNTER — Other Ambulatory Visit: Payer: Self-pay

## 2020-06-23 VITALS — BP 100/70 | Ht 63.0 in | Wt 232.0 lb

## 2020-06-23 DIAGNOSIS — R1030 Lower abdominal pain, unspecified: Secondary | ICD-10-CM

## 2020-06-23 DIAGNOSIS — R399 Unspecified symptoms and signs involving the genitourinary system: Secondary | ICD-10-CM | POA: Diagnosis not present

## 2020-06-23 DIAGNOSIS — M549 Dorsalgia, unspecified: Secondary | ICD-10-CM

## 2020-06-23 DIAGNOSIS — R350 Frequency of micturition: Secondary | ICD-10-CM

## 2020-06-23 DIAGNOSIS — N898 Other specified noninflammatory disorders of vagina: Secondary | ICD-10-CM | POA: Diagnosis not present

## 2020-06-23 DIAGNOSIS — R102 Pelvic and perineal pain: Secondary | ICD-10-CM

## 2020-06-23 LAB — POCT URINALYSIS DIPSTICK
Bilirubin, UA: NEGATIVE
Blood, UA: NEGATIVE
Glucose, UA: NEGATIVE
Ketones, UA: NEGATIVE
Nitrite, UA: NEGATIVE
Protein, UA: NEGATIVE
Spec Grav, UA: 1.025 (ref 1.010–1.025)
pH, UA: 7.5 (ref 5.0–8.0)

## 2020-06-23 LAB — POCT WET PREP WITH KOH
Clue Cells Wet Prep HPF POC: NEGATIVE
KOH Prep POC: NEGATIVE
Trichomonas, UA: NEGATIVE
Yeast Wet Prep HPF POC: NEGATIVE

## 2020-06-23 MED ORDER — FLUCONAZOLE 150 MG PO TABS: 150.0000 mg | ORAL_TABLET | Freq: Once | ORAL | 0 refills | Status: AC

## 2020-06-25 ENCOUNTER — Encounter: Payer: Self-pay | Admitting: Obstetrics and Gynecology

## 2020-06-25 LAB — URINE CULTURE

## 2020-06-28 NOTE — Telephone Encounter (Signed)
Pt needs GyN u/s since sx persist. Pls give her note for today. Thx

## 2020-06-29 ENCOUNTER — Other Ambulatory Visit: Payer: Self-pay

## 2020-06-29 ENCOUNTER — Ambulatory Visit (INDEPENDENT_AMBULATORY_CARE_PROVIDER_SITE_OTHER): Payer: BC Managed Care – PPO

## 2020-06-29 ENCOUNTER — Other Ambulatory Visit: Payer: Self-pay | Admitting: Obstetrics and Gynecology

## 2020-06-29 DIAGNOSIS — R102 Pelvic and perineal pain: Secondary | ICD-10-CM

## 2020-06-29 NOTE — Telephone Encounter (Signed)
Pt aware of neg Gyn u/s results for BLQ pains (already had neg C&S). Sx have improved. Can return to work. F/u prn.   ULTRASOUND REPORT  Location: [facility]  Date of Service: 06/29/2020    Indications:Pelvic Pain   Findings:  The uterus is anteverted and measures 9.1 x 5.3 x 5.4 cm. Echo texture is heterogenous with evidence of focal masses. Within the uterus are multiple suspected fibroids measuring: Fibroid 1:8.6 x 8.7 x 8.8 mm anterior subserosal  Fibroid 2: 32.0 x 32.8 x 34.6 mm less than 25% submucosal, posterior Fibroid 3: 18.8 x 23.3 x 25.7 mm anterior subserosal left  The Endometrium measures 5.9 mm.  Right Ovary measures 3.4 x 2.8 x 1.8 cm. It is normal in appearance.  Left Ovary measures 3.5 x 3.4 x 2.2 cm. It is normal in appearance. Survey of the adnexa demonstrates no adnexal masses. There is scant free fluid in the posterior cul-de-sac.   Impression: 1. There are three uterine fibroids seen. One is less than 25% submucosal.  2. There is a resolving corpus luteum in the right ovary.  3. Normal appearing left ovary.    Recommendations: 1.Clinical correlation with the patient's History and Physical Exam.   Gweneth Dimitri, RT

## 2020-06-29 NOTE — Telephone Encounter (Signed)
Yes, that is fine. Thx

## 2020-06-29 NOTE — Telephone Encounter (Signed)
Completed in separate message. Note written per ABC.

## 2020-07-15 ENCOUNTER — Encounter: Payer: BC Managed Care – PPO | Admitting: Nurse Practitioner

## 2020-09-16 ENCOUNTER — Telehealth: Payer: Self-pay

## 2020-09-16 MED ORDER — AZITHROMYCIN 250 MG PO TABS: ORAL_TABLET | ORAL | 0 refills | Status: DC

## 2020-09-16 NOTE — Telephone Encounter (Signed)
Pt called c/o having a ear infection and was requesting an antibiotic per Ladona Ridgel I sent in zpak to pharmacy

## 2020-09-21 ENCOUNTER — Other Ambulatory Visit: Payer: Self-pay

## 2020-09-21 ENCOUNTER — Ambulatory Visit: Payer: Self-pay | Admitting: Nurse Practitioner

## 2020-09-21 VITALS — BP 124/84 | HR 97 | Temp 97.0°F | Resp 16 | Ht 63.0 in | Wt 228.2 lb

## 2020-09-21 DIAGNOSIS — R300 Dysuria: Secondary | ICD-10-CM

## 2020-09-21 DIAGNOSIS — J0141 Acute recurrent pansinusitis: Secondary | ICD-10-CM

## 2020-09-21 DIAGNOSIS — H65196 Other acute nonsuppurative otitis media, recurrent, bilateral: Secondary | ICD-10-CM

## 2020-09-21 LAB — POCT URINALYSIS DIPSTICK
Bilirubin, UA: NEGATIVE
Blood, UA: NEGATIVE
Glucose, UA: NEGATIVE
Ketones, UA: NEGATIVE
Leukocytes, UA: NEGATIVE
Nitrite, UA: NEGATIVE
Protein, UA: POSITIVE — AB
Spec Grav, UA: 1.025 (ref 1.010–1.025)
Urobilinogen, UA: 0.2 E.U./dL
pH, UA: 6 (ref 5.0–8.0)

## 2020-09-21 MED ORDER — SULFAMETHOXAZOLE-TRIMETHOPRIM 800-160 MG PO TABS: 1.0000 | ORAL_TABLET | Freq: Two times a day (BID) | ORAL | 0 refills | Status: DC

## 2020-09-21 MED ORDER — OFLOXACIN 0.3 % OT SOLN: 5.0000 [drp] | Freq: Two times a day (BID) | OTIC | 0 refills | Status: DC

## 2020-09-21 NOTE — Progress Notes (Signed)
[facility] [address] [place], [place] [address]  Internal MEDICINE  Office Visit Note  Patient Name: Shannon Foley  [id number]  [id number]  Date of Service: 10/08/2020  No chief complaint on file.   The patient is here for follow up visit. Was treated for sinus infection last week. Was given a z-pack. Finished this yesterday. Still feels congested. She has right ear pain. She feels like she never really got better. She denies fever or chills. She states that she does have some nausea. Denies vomiting. She states that she has tried taking OTC medication which have not helped.  She also states that she is having frequency of urination. She has some pelvic pain and bladder spasms.  -new symptoms started on Friday.        Current Medication: Outpatient Encounter Medications as of 09/21/2020  Medication Sig  . ofloxacin (FLOXIN) 0.3 % OTIC solution Place 5 drops into both ears 2 (two) times daily.  Marland Kitchen sulfamethoxazole-trimethoprim (BACTRIM DS) 800-160 MG tablet Take 1 tablet by mouth 2 (two) times daily.  Marland Kitchen azithromycin (ZITHROMAX) 250 MG tablet Take as directed for 5 days   No facility-administered encounter medications on file as of 09/21/2020.    Surgical History: Past Surgical History:  Procedure Laterality Date  . COLONOSCOPY    . CYSTOSCOPY  05/10/2016   WNL  . DILATION AND EVACUATION N/A 04/13/2020   Procedure: SUCTION DILATION AND CURETTAGE;  Surgeon: Vena Austria, MD;  Location: [facility];  Service: Gynecology;  Laterality: N/A;  . ESOPHAGOGASTRODUODENOSCOPY (EGD) WITH PROPOFOL N/A 02/18/2016   Procedure: ESOPHAGOGASTRODUODENOSCOPY (EGD) WITH PROPOFOL;  Surgeon: Midge Minium, MD;  Location: [facility];  Service: Endoscopy;  Laterality: N/A;  . TONSILLECTOMY  2017  . TONSILLECTOMY      Medical History: Past Medical History:  Diagnosis Date  . Back pain    mid and lower back  . Chronic abdominal pain   . Chronic constipation   . Fibroids     one submucosal and one intramural (both  < 1 inch in size)  . Headache    caused by pituitary cyst  . IBS (irritable bowel syndrome)   . Motion sickness cars  . Obesity   . Pituitary cyst ([facility])    Rathke's pouch cyst    Family History: Family History  Problem Relation Age of Onset  . Diabetes Mother   . Hypertension Mother   . Congestive Heart Failure Mother   . CVA Father        Had meningioma  . Diabetes Father   . Hypertension Father   . Bladder Cancer Paternal Aunt   . Colon cancer Neg Hx   . Esophageal cancer Neg Hx   . Stomach cancer Neg Hx   . Rectal cancer Neg Hx     Social History   Socioeconomic History  . Marital status: Single    Spouse name: Not on file  . Number of children: 0  . Years of education: 71  . Highest education level: Not on file  Occupational History  . Occupation: IT sales professional  Tobacco Use  . Smoking status: Never Smoker  . Smokeless tobacco: Never Used  Vaping Use  . Vaping Use: Never used  Substance and Sexual Activity  . Alcohol use: No  . Drug use: No  . Sexual activity: Yes    Birth control/protection: None  Other Topics Concern  . Not on file  Social History Narrative  . Not on file  Social Determinants of Health   Financial Resource Strain: Not on file  Food Insecurity: Not on file  Transportation Needs: Not on file  Physical Activity: Not on file  Stress: Not on file  Social Connections: Not on file  Intimate Partner Violence: Not on file      Review of Systems  Constitutional: Positive for chills and fatigue.  HENT: Positive for congestion, ear pain, postnasal drip, rhinorrhea, sinus pain and sore throat. Negative for voice change.   Respiratory: Negative for cough and wheezing.   Cardiovascular: Negative for chest pain and palpitations.  Gastrointestinal: Positive for nausea. Negative for diarrhea and vomiting.  Genitourinary: Positive for dysuria, frequency, pelvic pain and urgency.   Allergic/Immunologic: Positive for environmental allergies.  Neurological: Positive for headaches.   Today's Vitals   09/21/20 0952  BP: 124/84  Pulse: 97  Resp: 16  Temp: (!) 97 F (36.1 C)  TempSrc: Temporal  SpO2: 99%  Weight: 228 lb 3.2 oz (103.5 kg)  Height: 5\' 3"  (1.6 m)   Body mass index is 40.42 kg/m.  Physical Exam Vitals and nursing note reviewed.  Constitutional:      General: She is not in acute distress.    Appearance: Normal appearance. She is well-developed and well-nourished. She is obese. She is not diaphoretic.  HENT:     Head: Normocephalic and atraumatic.     Right Ear: Tympanic membrane is erythematous and bulging.     Left Ear: Tympanic membrane is erythematous and bulging.     Nose: Congestion present.     Right Sinus: Maxillary sinus tenderness and frontal sinus tenderness present.     Left Sinus: Maxillary sinus tenderness and frontal sinus tenderness present.     Mouth/Throat:     Pharynx: Posterior oropharyngeal erythema present. No oropharyngeal exudate.  Eyes:     Extraocular Movements: EOM normal.     Pupils: Pupils are equal, round, and reactive to light.  Neck:     Thyroid: No thyromegaly.     Vascular: No JVD.     Trachea: No tracheal deviation.  Cardiovascular:     Rate and Rhythm: Normal rate and regular rhythm.     Heart sounds: Normal heart sounds. No murmur heard. No friction rub. No gallop.   Pulmonary:     Effort: Pulmonary effort is normal. No respiratory distress.     Breath sounds: Normal breath sounds. No wheezing or rales.  Chest:     Chest wall: No tenderness.  Abdominal:     General: Bowel sounds are normal.     Palpations: Abdomen is soft.     Tenderness: There is abdominal tenderness.     Comments: Pelvic tenderness with palpation of the abdomen.   Genitourinary:    Comments: Urine sample positive for protein  Musculoskeletal:        General: Normal range of motion.     Cervical back: Normal range of motion  and neck supple.  Lymphadenopathy:     Cervical: No cervical adenopathy.  Skin:    General: Skin is warm and dry.  Neurological:     Mental Status: She is alert and oriented to person, place, and time.     Cranial Nerves: No cranial nerve deficit.  Psychiatric:        Mood and Affect: Mood and affect and mood normal.        Behavior: Behavior normal.        Thought Content: Thought content normal.  Judgment: Judgment normal.    Assessment/Plan: 1. Recurrent pansinusitis Start bactrim DS twice daily for ten days. Advised her to rest and increase fluids. Take OTC medications as needed and as indicated for acute symptoms.  - sulfamethoxazole-trimethoprim (BACTRIM DS) 800-160 MG tablet; Take 1 tablet by mouth 2 (two) times daily.  Dispense: 20 tablet; Refill: 0  2. Other recurrent acute nonsuppurative otitis media of both ears Trial ofloxacin ear drops. Use 5 drops in both ears for next 7 days.  - ofloxacin (FLOXIN) 0.3 % OTIC solution; Place 5 drops into both ears 2 (two) times daily.  Dispense: 5 mL; Refill: 0  3. Dysuria - POCT Urinalysis Dipstick positive for protein only. Will monitor .  General Counseling: Shannon Foley verbalizes understanding of the findings of todays visit and agrees with plan of treatment. I have discussed any further diagnostic evaluation that may be needed or ordered today. We also reviewed her medications today. she has been encouraged to call the office with any questions or concerns that should arise related to todays visit.  This patient was seen by Leretha Pol FNP Collaboration with Dr [name] as a part of collaborative care agreement  Orders Placed This Encounter  Procedures  . POCT Urinalysis Dipstick    Meds ordered this encounter  Medications  . sulfamethoxazole-trimethoprim (BACTRIM DS) 800-160 MG tablet    Sig: Take 1 tablet by mouth 2 (two) times daily.    Dispense:  20 tablet    Refill:  0    Order Specific Question:    Supervising Provider    Answer:   [name] [id number]  . ofloxacin (FLOXIN) 0.3 % OTIC solution    Sig: Place 5 drops into both ears 2 (two) times daily.    Dispense:  5 mL    Refill:  0    Order Specific Question:   Supervising Provider    Answer:   [name] [id number]    Total time spent: 30 Minutes  ` Time spent includes review of chart, medications, test results, and follow up plan with the patient.      Dr [name] Internal medicine

## 2020-10-08 ENCOUNTER — Encounter: Payer: Self-pay | Admitting: Nurse Practitioner

## 2020-10-08 DIAGNOSIS — J0141 Acute recurrent pansinusitis: Secondary | ICD-10-CM | POA: Insufficient documentation

## 2020-11-30 ENCOUNTER — Ambulatory Visit: Payer: Medicare Other | Admitting: Obstetrics

## 2020-12-01 ENCOUNTER — Encounter: Payer: Self-pay | Admitting: Obstetrics

## 2020-12-01 ENCOUNTER — Ambulatory Visit (INDEPENDENT_AMBULATORY_CARE_PROVIDER_SITE_OTHER): Payer: Medicaid Other | Admitting: Obstetrics

## 2020-12-01 ENCOUNTER — Other Ambulatory Visit: Payer: Self-pay

## 2020-12-01 VITALS — BP 120/80 | HR 70 | Ht 63.0 in | Wt 232.0 lb

## 2020-12-01 DIAGNOSIS — Z3169 Encounter for other general counseling and advice on procreation: Secondary | ICD-10-CM | POA: Diagnosis not present

## 2020-12-01 NOTE — Progress Notes (Signed)
Patient would like to discuss planning for pregnancy. She requests Diabetes screening.

## 2020-12-06 ENCOUNTER — Encounter: Payer: Self-pay | Admitting: Obstetrics

## 2020-12-06 NOTE — Progress Notes (Addendum)
Obstetrics & Gynecology Office Visit   Chief Complaint:  Chief Complaint  Patient presents with  . Consult    Pre Conception Counseling    History of Present Concern: Shannon Foley presents with her mother to discuss her desire to get pregnant. She is single, partnered, and reports having regular cycles. She is not using birth control.   Review of Systems:  Review of Systems  Constitutional: Negative.   HENT: Negative.   Cardiovascular: Negative.   Genitourinary: Negative.   Skin: Negative.   Neurological: Negative.   Endo/Heme/Allergies: Negative.      Past Medical History:  Past Medical History:  Diagnosis Date  . Back pain    mid and lower back  . Chronic abdominal pain   . Chronic constipation   . Fibroids    one submucosal and one intramural (both  < 1 inch in size)  . Headache    caused by pituitary cyst  . IBS (irritable bowel syndrome)   . Motion sickness cars  . Obesity   . Pituitary cyst ([place])    Rathke's pouch cyst    Past Surgical History:  Past Surgical History:  Procedure Laterality Date  . COLONOSCOPY    . CYSTOSCOPY  05/10/2016   WNL  . DILATION AND EVACUATION N/A 04/13/2020   Procedure: SUCTION DILATION AND CURETTAGE;  Surgeon: [name], MD;  Location: [facility];  Service: Gynecology;  Laterality: N/A;  . ESOPHAGOGASTRODUODENOSCOPY (EGD) WITH PROPOFOL N/A 02/18/2016   Procedure: ESOPHAGOGASTRODUODENOSCOPY (EGD) WITH PROPOFOL;  Surgeon: Lucilla Lame, MD;  Location: [place];  Service: Endoscopy;  Laterality: N/A;  . TONSILLECTOMY  2017  . TONSILLECTOMY      Gynecologic History: Patient's last menstrual period was 11/30/2020 (exact date).  Obstetric History: G1P0010  Family History:  Family History  Problem Relation Age of Onset  . Diabetes Mother   . Hypertension Mother   . Congestive Heart Failure Mother   . CVA Father        Had meningioma  . Diabetes Father   . Hypertension Father   . Bladder Cancer Paternal Aunt    . Colon cancer Neg Hx   . Esophageal cancer Neg Hx   . Stomach cancer Neg Hx   . Rectal cancer Neg Hx     Social History:  Social History   Socioeconomic History  . Marital status: Single    Spouse name: Not on file  . Number of children: 0  . Years of education: 75  . Highest education level: Not on file  Occupational History  . Occupation: Chemical engineer  Tobacco Use  . Smoking status: Never Smoker  . Smokeless tobacco: Never Used  Vaping Use  . Vaping Use: Never used  Substance and Sexual Activity  . Alcohol use: No  . Drug use: No  . Sexual activity: Yes    Birth control/protection: None  Other Topics Concern  . Not on file  Social History Narrative  . Not on file   Social Determinants of Health   Financial Resource Strain: Not on file  Food Insecurity: Not on file  Transportation Needs: Not on file  Physical Activity: Not on file  Stress: Not on file  Social Connections: Not on file  Intimate Partner Violence: Not on file    Allergies:  No Known Allergies  Medications: Prior to Admission medications   Medication Sig Start Date End Date Taking? Authorizing Provider  azithromycin (ZITHROMAX) 250 MG tablet Take as directed for 5 days 09/16/20  Luiz Ochoa, NP  ofloxacin (FLOXIN) 0.3 % OTIC solution Place 5 drops into both ears 2 (two) times daily. 09/21/20   Ronnell Freshwater, NP  sulfamethoxazole-trimethoprim (BACTRIM DS) 800-160 MG tablet Take 1 tablet by mouth 2 (two) times daily. 09/21/20   Ronnell Freshwater, NP    Physical Exam Vitals:  Vitals:   12/01/20 1608  BP: 120/80  Pulse: 70   Patient's last menstrual period was 11/30/2020 (exact date).  Physical Exam Constitutional:      Appearance: She is obese.  HENT:     Head: Normocephalic and atraumatic.     Nose: Nose normal.  Cardiovascular:     Rate and Rhythm: Regular rhythm.     Pulses: Normal pulses.     Heart sounds: Normal heart sounds.  Pulmonary:     Breath sounds: Normal  breath sounds.  Abdominal:     Palpations: Abdomen is soft.     Comments: Obes/adipose BMI 41.  Genitourinary:    Comments: Exam deferred. She has had a GYN physical within the last 6 months. Musculoskeletal:        General: Normal range of motion.     Cervical back: Normal range of motion and neck supple.  Skin:    General: Skin is warm and dry.  Neurological:     General: No focal deficit present.     Mental Status: She is alert and oriented to person, place, and time.      Assessment: 35 y.o. G1P0010  For preconception counseling- desires pregnancy MAB with subsequent D and C last August 2021   Plan: Problem List Items Addressed This Visit   None    Lengthy discussion regarding optimizing her chances of conceiving, included:  Counsel to start on a daily PNV with folic acid. Addressed those health measures that contribute to more challenges in attaining a pregnancy, including: advanced age for conceiving and High BMI ( gently addressed her morbid obesity), her hyperprolactinemia and pituitary lesion hx Her 13 week MAB and D and C also discussed briefly. Suggested she work to lose weight over the next few months while trying to get pregnant. We also discussed optimal times to conceive and she received education in fertility awareness and how to recognize fertile times in her cycle. Instructed her to try for the next 6 months, and if she has not gotten pregnant then she should make an appointment for a basic fertility workup with one of the physicians at [facility].  A total of 40 minutes was spent reviewing her medical hx, providing face to face conception counseling and for documentation.  [name], CNM  12/06/2020 5:12 PM

## 2020-12-21 ENCOUNTER — Telehealth: Payer: Self-pay

## 2020-12-21 ENCOUNTER — Encounter: Payer: Self-pay | Admitting: Internal Medicine

## 2020-12-21 NOTE — Telephone Encounter (Signed)
Patient mailed discharge letter for non compliancy. Shannon Foley

## 2021-02-02 ENCOUNTER — Other Ambulatory Visit: Payer: Self-pay

## 2021-02-02 ENCOUNTER — Emergency Department
Admission: EM | Admit: 2021-02-02 | Discharge: 2021-02-02 | Disposition: A | Discharge status: Home / Self Care | Payer: Medicaid Other | Attending: Emergency Medicine | Admitting: Emergency Medicine

## 2021-02-02 ENCOUNTER — Encounter: Payer: Self-pay | Admitting: Emergency Medicine

## 2021-02-02 DIAGNOSIS — K219 Gastro-esophageal reflux disease without esophagitis: Secondary | ICD-10-CM | POA: Diagnosis not present

## 2021-02-02 DIAGNOSIS — Z8719 Personal history of other diseases of the digestive system: Secondary | ICD-10-CM | POA: Diagnosis not present

## 2021-02-02 DIAGNOSIS — R1032 Left lower quadrant pain: Secondary | ICD-10-CM | POA: Insufficient documentation

## 2021-02-02 DIAGNOSIS — R1012 Left upper quadrant pain: Secondary | ICD-10-CM | POA: Diagnosis not present

## 2021-02-02 LAB — CBC
HCT: 40.4 % (ref 36.0–46.0)
Hemoglobin: 13.6 g/dL (ref 12.0–15.0)
MCH: 30.6 pg (ref 26.0–34.0)
MCHC: 33.7 g/dL (ref 30.0–36.0)
MCV: 90.8 fL (ref 80.0–100.0)
Platelets: 388 10*3/uL (ref 150–400)
RBC: 4.45 MIL/uL (ref 3.87–5.11)
RDW: 13.3 % (ref 11.5–15.5)
WBC: 7.8 10*3/uL (ref 4.0–10.5)
nRBC: 0 % (ref 0.0–0.2)

## 2021-02-02 LAB — COMPREHENSIVE METABOLIC PANEL
ALT: 7 U/L (ref 0–44)
AST: 16 U/L (ref 15–41)
Albumin: 4 g/dL (ref 3.5–5.0)
Alkaline Phosphatase: 68 U/L (ref 38–126)
Anion gap: 7 (ref 5–15)
BUN: 11 mg/dL (ref 6–20)
CO2: 25 mmol/L (ref 22–32)
Calcium: 8.8 mg/dL — ABNORMAL LOW (ref 8.9–10.3)
Chloride: 106 mmol/L (ref 98–111)
Creatinine, Ser: 0.79 mg/dL (ref 0.44–1.00)
GFR, Estimated: >60 mL/min (ref >60)
Glucose, Bld: 106 mg/dL — ABNORMAL HIGH (ref 70–99)
Potassium: 3.9 mmol/L (ref 3.5–5.1)
Sodium: 138 mmol/L (ref 135–145)
Total Bilirubin: 0.6 mg/dL (ref 0.3–1.2)
Total Protein: 7.7 g/dL (ref 6.5–8.1)

## 2021-02-02 LAB — URINALYSIS, COMPLETE (UACMP) WITH MICROSCOPIC
Bacteria, UA: NONE SEEN
Bilirubin Urine: NEGATIVE
Glucose, UA: NEGATIVE mg/dL
Hgb urine dipstick: NEGATIVE
Ketones, ur: NEGATIVE mg/dL
Nitrite: NEGATIVE
Protein, ur: NEGATIVE mg/dL
Specific Gravity, Urine: 1.017 (ref 1.005–1.030)
pH: 7 (ref 5.0–8.0)

## 2021-02-02 LAB — LIPASE, BLOOD: Lipase: 27 U/L (ref 11–51)

## 2021-02-02 LAB — POC URINE PREG, ED: Preg Test, Ur: NEGATIVE

## 2021-02-02 NOTE — ED Triage Notes (Signed)
Pt arrived via POV with reports of LUQ abd pain for several days. Pt c/o nausea. Pt states the pain has not changed.   Pt does not have a PCP at this time.

## 2021-02-02 NOTE — ED Provider Notes (Signed)
[facility] Emergency Department Provider Note   ____________________________________________   Event Date/Time   First MD Initiated Contact with Patient 02/02/21 0830     (approximate)  I have reviewed the triage vital signs and the nursing notes.   HISTORY  Chief Complaint Abdominal Pain (LUQ)    HPI Shannon Foley is a 35 y.o. female with the below stated past medical history who presents for left upper quadrant abdominal pain for the last 3 days.  Patient describes an aching, 8/10, nonradiating, left upper quadrant/left lower costal pain that does not radiate and has no exacerbating or relieving factors.  Patient states that she has not tried any medications for the symptoms.  Patient currently denies any vision changes, tinnitus, difficulty speaking, facial droop, sore throat, chest pain, shortness of breath, abdominal pain, vomiting/diarrhea, dysuria, or weakness/numbness/paresthesias in any extremity         Past Medical History:  Diagnosis Date  . Back pain    mid and lower back  . Chronic abdominal pain   . Chronic constipation   . Fibroids    one submucosal and one intramural (both  < 1 inch in size)  . Headache    caused by pituitary cyst  . IBS (irritable bowel syndrome)   . Missed abortion   . Motion sickness cars  . Obesity   . Pituitary cyst ([place])    Rathke's pouch cyst    Patient Active Problem List   Diagnosis Date Noted  . Recurrent pansinusitis 10/08/2020  . Abnormal biochemical finding on antenatal screening of mother 04/01/2020  . Vaginal candida 03/18/2019  . CIN I (cervical intraepithelial neoplasia I) 02/13/2019  . Acute otitis media 01/06/2019  . Acute upper respiratory infection 12/08/2018  . Fever and chills 12/08/2018  . Sore throat 12/08/2018  . Hiatal hernia 06/23/2018  . Left hip pain 06/23/2018  . Needs flu shot 06/23/2018  . Uterine fibroid 06/13/2018  . IBS (irritable bowel syndrome) 05/29/2018   . Low grade squamous intraepithelial lesion (LGSIL) on cervical Pap smear 05/27/2018  . Encounter for general adult medical examination with abnormal findings 04/03/2018  . Gastroesophageal reflux disease without esophagitis 04/03/2018  . Chronic midline low back pain without sciatica 04/03/2018  . Chronic midline thoracic back pain 04/03/2018  . Urinary tract infection without hematuria 04/03/2018  . Dysuria 04/03/2018  . Chest pain 01/08/2018  . Epigastric abdominal pain 01/08/2018  . Abnormal findings-gastrointestinal tract   . Rathke's cleft cyst ([place]) 02/02/2016  . Anemia 04/29/2014  . Fatigue 04/29/2014  . Hyperprolactinemia ([place]) 04/29/2014  . Vitamin B 12 deficiency 04/29/2014  . 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 Pituitary lesion ([place]) 02/19/2014  . New onset of headaches 02/19/2014  . Other enthesopathy of unspecified foot 12/18/2013  . Pain in foot 12/18/2013  . Dupuytren's contracture of foot 12/18/2013  . Plantar fasciitis 12/18/2013  . Calcaneal bursitis 12/18/2013  . Plantar fascial fibromatosis 12/18/2013    Past Surgical History:  Procedure Laterality Date  . COLONOSCOPY    . CYSTOSCOPY  05/10/2016   WNL  . DILATION AND EVACUATION N/A 04/13/2020   Procedure: SUCTION DILATION AND CURETTAGE;  Surgeon: [name], MD;  Location: [facility];  Service: Gynecology;  Laterality: N/A;  . ESOPHAGOGASTRODUODENOSCOPY (EGD) WITH PROPOFOL N/A 02/18/2016   Procedure: ESOPHAGOGASTRODUODENOSCOPY (EGD) WITH PROPOFOL;  Surgeon: Lucilla Lame, MD;  Location: [place];  Service: Endoscopy;  Laterality: N/A;  . TONSILLECTOMY  2017  .  TONSILLECTOMY      Prior to Admission medications   Not on File    Allergies Patient has no known allergies.  Family History  Problem Relation Age of Onset  . Diabetes Mother   . Hypertension Mother   . Congestive Heart Failure Mother   . CVA Father        Had meningioma  .  Diabetes Father   . Hypertension Father   . Bladder Cancer Paternal Aunt   . Colon cancer Neg Hx   . Esophageal cancer Neg Hx   . Stomach cancer Neg Hx   . Rectal cancer Neg Hx     Social History Social History   Tobacco Use  . Smoking status: Never Smoker  . Smokeless tobacco: Never Used  Vaping Use  . Vaping Use: Never used  Substance Use Topics  . Alcohol use: No  . Drug use: No    Review of Systems Constitutional: No fever/chills Eyes: No visual changes. ENT: No sore throat. Cardiovascular: Denies chest pain. Respiratory: Denies shortness of breath. Gastrointestinal: Endorses left upper quadrant abdominal pain.  No nausea, no vomiting.  No diarrhea. Genitourinary: Negative for dysuria. Musculoskeletal: Negative for acute arthralgias Skin: Negative for rash. Neurological: Negative for headaches, weakness/numbness/paresthesias in any extremity Psychiatric: Negative for suicidal ideation/homicidal ideation   ____________________________________________   PHYSICAL EXAM:  VITAL SIGNS: ED Triage Vitals  Enc Vitals Group     BP 02/02/21 0615 132/87     Pulse Rate 02/02/21 0615 77     Resp 02/02/21 0615 18     Temp 02/02/21 0615 98.2 F (36.8 C)     Temp Source 02/02/21 0615 Oral     SpO2 02/02/21 0615 97 %     Weight 02/02/21 0611 224 lb (101.6 kg)     Height 02/02/21 0611 5\' 3"  (1.6 m)     Head Circumference --      Peak Flow --      Pain Score 02/02/21 0610 8     Pain Loc --      Pain Edu? --      Excl. in [place]? --    Constitutional: Alert and oriented. Well appearing and in no acute distress. Eyes: Conjunctivae are normal. PERRL. Head: Atraumatic. Nose: No congestion/rhinnorhea. Mouth/Throat: Mucous membranes are moist. Neck: No stridor Cardiovascular: Grossly normal heart sounds.  Good peripheral circulation. Respiratory: Normal respiratory effort.  No retractions. Gastrointestinal: Soft and nontender. No distention. Musculoskeletal: No obvious  deformities Neurologic:  Normal speech and language. No gross focal neurologic deficits are appreciated. Skin:  Skin is warm and dry. No rash noted. Psychiatric: Mood and affect are normal. Speech and behavior are normal.  ____________________________________________   LABS (all labs ordered are listed, but only abnormal results are displayed)  Labs Reviewed  COMPREHENSIVE METABOLIC PANEL - Abnormal; Notable for the following components:      Result Value   Glucose, Bld 106 (*)    Calcium 8.8 (*)    All other components within normal limits  URINALYSIS, COMPLETE (UACMP) WITH MICROSCOPIC - Abnormal; Notable for the following components:   Color, Urine YELLOW (*)    APPearance HAZY (*)    Leukocytes,Ua SMALL (*)    All other components within normal limits  LIPASE, BLOOD  CBC  POC URINE PREG, ED   ____________________________________________  EKG  ED ECG REPORT I, Naaman Plummer, the attending physician, personally viewed and interpreted this ECG.  Date: 02/02/2021 EKG Time: 0633 Rate: 78 Rhythm: normal sinus rhythm  QRS Axis: normal Intervals: normal ST/T Wave abnormalities: normal Narrative Interpretation: no evidence of acute ischemia   PROCEDURES  Procedure(s) performed (including Critical Care):  .1-3 Lead EKG Interpretation Performed by: Naaman Plummer, MD Authorized by: Naaman Plummer, MD     Interpretation: normal     ECG rate:  71   ECG rate assessment: normal     Rhythm: sinus rhythm     Ectopy: none     Conduction: normal       ____________________________________________   INITIAL IMPRESSION / ASSESSMENT AND PLAN / ED COURSE  As part of my medical decision making, I reviewed the following data within the [place] notes reviewed and incorporated, Labs reviewed, EKG interpreted, Old chart reviewed, Radiograph reviewed and Notes from prior ED visits reviewed and incorporated        Patients symptoms not typical  for emergent causes of abdominal pain such as, but not limited to, appendicitis, abdominal aortic aneurysm, surgical biliary disease, pancreatitis, SBO, mesenteric ischemia, serious intra-abdominal bacterial illness. Presentation also not typical of gynecologic emergencies such as TOA, Ovarian Torsion, PID. Not Ectopic. Doubt atypical ACS.  Pt tolerating PO. Disposition: Patient will be discharged with strict return precautions and follow up with primary MD within 12-24 hours for further evaluation. Patient understands that this still may have an early presentation of an emergent medical condition such as appendicitis that will require a recheck.      ____________________________________________   FINAL CLINICAL IMPRESSION(S) / ED DIAGNOSES  Final diagnoses:  None     ED Discharge Orders    None       Note:  This document was prepared using Dragon voice recognition software and may include unintentional dictation errors.   Naaman Plummer, MD 02/02/21 [phone]

## 2021-02-02 NOTE — ED Notes (Signed)
Pt to ED c/o LUQ abdominal pain, "like a knot" and sharp intermittent. LBM this morning, normal. Denies urinary symptoms. In NAD, walked to ED room 9.

## 2021-02-02 NOTE — ED Notes (Signed)
EDP at bedside  

## 2021-02-16 ENCOUNTER — Encounter: Payer: Self-pay | Admitting: Obstetrics

## 2021-02-16 ENCOUNTER — Ambulatory Visit (INDEPENDENT_AMBULATORY_CARE_PROVIDER_SITE_OTHER): Payer: Medicaid Other | Admitting: Obstetrics

## 2021-02-16 ENCOUNTER — Other Ambulatory Visit: Payer: Self-pay

## 2021-02-16 VITALS — BP 128/70 | Ht 63.0 in | Wt 227.8 lb

## 2021-02-16 DIAGNOSIS — N939 Abnormal uterine and vaginal bleeding, unspecified: Secondary | ICD-10-CM

## 2021-02-16 NOTE — Progress Notes (Signed)
Obstetrics & Gynecology Office Visit   Chief Complaint:  Chief Complaint  Patient presents with  . Gynecologic Exam    History of Present Illness: Shannon Foley presents c/o a heavy period thast is requring her to change tampons q 2 hours (super). She is actively trying to conceive. She was seen by this provider several months ago and advised to start on a PNV daily and work to drop her weight as her BMI is 40. Todays she reports that she is not routoinely taking a PNV and she has not worked on weight reduction. Her period started on 5/27 and is heavy this cycle. She makes reference to having fibroids. Her periods are regular, lasting 5 days and occur every 28 days. Her Hx is significant for a Missed AB and subsequent D an C in July of 2021.   Review of Systems:  Review of Systems  Constitutional: Negative.   HENT: Negative.   Eyes: Negative.   Cardiovascular: Negative.   Gastrointestinal: Negative.   Genitourinary: Negative.   Musculoskeletal: Negative.   Skin: Negative.   Neurological: Negative.   Endo/Heme/Allergies: Negative.   Psychiatric/Behavioral: Negative.      Past Medical History:  Past Medical History:  Diagnosis Date  . Back pain    mid and lower back  . Chronic abdominal pain   . Chronic constipation   . Fibroids    one submucosal and one intramural (both  < 1 inch in size)  . Headache    caused by pituitary cyst  . IBS (irritable bowel syndrome)   . Missed abortion   . Motion sickness cars  . Obesity   . Pituitary cyst ([place])    Rathke's pouch cyst    Past Surgical History:  Past Surgical History:  Procedure Laterality Date  . COLONOSCOPY    . CYSTOSCOPY  05/10/2016   WNL  . DILATION AND EVACUATION N/A 04/13/2020   Procedure: SUCTION DILATION AND CURETTAGE;  Surgeon: [name], MD;  Location: [facility];  Service: Gynecology;  Laterality: N/A;  . ESOPHAGOGASTRODUODENOSCOPY (EGD) WITH PROPOFOL N/A 02/18/2016   Procedure: ESOPHAGOGASTRODUODENOSCOPY  (EGD) WITH PROPOFOL;  Surgeon: Lucilla Lame, MD;  Location: [place];  Service: Endoscopy;  Laterality: N/A;  . TONSILLECTOMY  2017  . TONSILLECTOMY      Gynecologic History: Patient's last menstrual period was 01/19/2021 (exact date).  Obstetric History: G1P0010  Family History:  Family History  Problem Relation Age of Onset  . Diabetes Mother   . Hypertension Mother   . Congestive Heart Failure Mother   . CVA Father        Had meningioma  . Diabetes Father   . Hypertension Father   . Bladder Cancer Paternal Aunt   . Colon cancer Neg Hx   . Esophageal cancer Neg Hx   . Stomach cancer Neg Hx   . Rectal cancer Neg Hx     Social History:  Social History   Socioeconomic History  . Marital status: Single    Spouse name: Not on file  . Number of children: 0  . Years of education: 81  . Highest education level: Not on file  Occupational History  . Occupation: Chemical engineer  Tobacco Use  . Smoking status: Never Smoker  . Smokeless tobacco: Never Used  Vaping Use  . Vaping Use: Never used  Substance and Sexual Activity  . Alcohol use: No  . Drug use: No  . Sexual activity: Yes    Birth control/protection: None  Other  Topics Concern  . Not on file  Social History Narrative  . Not on file   Social Determinants of Health   Financial Resource Strain: Not on file  Food Insecurity: Not on file  Transportation Needs: Not on file  Physical Activity: Not on file  Stress: Not on file  Social Connections: Not on file  Intimate Partner Violence: Not on file    Allergies:  No Known Allergies  Medications: Prior to Admission medications   Not on File    Physical Exam Vitals:  Vitals:   02/16/21 0835  BP: 128/70   Patient's last menstrual period was 01/19/2021 (exact date).  Physical Exam Constitutional:      Appearance: Normal appearance. She is obese.  HENT:     Head: Normocephalic and atraumatic.     Nose: Nose normal.  Cardiovascular:      Rate and Rhythm: Regular rhythm.  Pulmonary:     Effort: Pulmonary effort is normal.     Breath sounds: Normal breath sounds.  Abdominal:     Palpations: Abdomen is soft.     Comments: adipose  Genitourinary:    General: Normal vulva.     Comments: Normal external anatomy. No lesions or rashes. Spec exam reveals scant vaginal bleeding, normal rugae. Bimanual- anteverted uterus, mobile, non enlarged, no adnexal masses or tenderness on palpation. Musculoskeletal:        General: Normal range of motion.     Cervical back: Normal range of motion and neck supple.  Skin:    General: Skin is warm and dry.  Neurological:     General: No focal deficit present.     Mental Status: She is alert and oriented to person, place, and time.  Psychiatric:        Mood and Affect: Mood normal.        Behavior: Behavior normal.      Assessment: 35 y.o. G1P0010 heavy period.  Desirous of conception  Plan: Problem List Items Addressed This Visit   None   Visit Diagnoses    Abnormal uterine bleeding (AUB)    -  Primary     Discussed option of one month of OCPs to decrease her flow- she declines this. Discussed increasing her preconception fertility with weight loss as we had discussed at an earlier appointment.She has been trying to conceive for one month. Advised her to have IC midcycle every other day from Day 9 through 16, and restart PNVs. Should she not conceive in 5 more months , advised to make an appointment with an MD in the practice to discuss medical assistance.  [name], CNM  02/16/2021 10:46 AM

## 2021-02-21 NOTE — Telephone Encounter (Signed)
Patient is scheduled for 03/09/21

## 2021-02-22 ENCOUNTER — Other Ambulatory Visit: Payer: Self-pay

## 2021-03-09 ENCOUNTER — Other Ambulatory Visit: Payer: Self-pay

## 2021-03-09 ENCOUNTER — Ambulatory Visit (INDEPENDENT_AMBULATORY_CARE_PROVIDER_SITE_OTHER): Payer: Medicaid Other | Admitting: Obstetrics and Gynecology

## 2021-03-09 ENCOUNTER — Encounter: Payer: Self-pay | Admitting: Obstetrics and Gynecology

## 2021-03-09 VITALS — BP 127/84 | Ht 63.0 in | Wt 228.0 lb

## 2021-03-09 DIAGNOSIS — E237 Disorder of pituitary gland, unspecified: Secondary | ICD-10-CM

## 2021-03-09 DIAGNOSIS — Z131 Encounter for screening for diabetes mellitus: Secondary | ICD-10-CM

## 2021-03-09 DIAGNOSIS — Z3169 Encounter for other general counseling and advice on procreation: Secondary | ICD-10-CM | POA: Diagnosis not present

## 2021-03-09 DIAGNOSIS — N926 Irregular menstruation, unspecified: Secondary | ICD-10-CM

## 2021-03-09 DIAGNOSIS — Z6841 Body Mass Index (BMI) 40.0 and over, adult: Secondary | ICD-10-CM

## 2021-03-09 DIAGNOSIS — Z31438 Encounter for other genetic testing of female for procreative management: Secondary | ICD-10-CM

## 2021-03-09 NOTE — Progress Notes (Signed)
Gynecology H&P  PCP: Pcp, No  Chief Complaint:  Chief Complaint  Patient presents with   fertility    Discuss options for fertility. Procedure RM    History of Present Illness: Patient is a 35 y.o. G1P0010 presenting for evaluation of infertility. Patient and partner have been attempting unprotected intercourse for 1 year and actively trying for conception for 1 year.  Pregnancies with current partner yes.  Patient underwent D&C for first trimester miscarriage 03/2020  Sexual History: Dyspareunia: no Use of Lubricant: no Douching: no   Ovulatory Evaluation LMP: Patient's last menstrual period was 02/12/2021. Interval regular monthly but current menstrual cycle about a week late Duration of flow: 5 days Heavy Menses: no Clots: no Intermenstrual Bleeding: no Dysmenorrhea: no  [place] yes Ovulation Predictor Kits Positive  no  PCOS  Wt Change: no Hirsutism: no Acne: no  Hyperprolactinemia Galactorrhea: no Headaches: no Vision Changes:  no  Thyroid Temperature Intolerance: no Constipation or Diarrhea: no Hair Thinning:  no Palpitation:  no  Prior treatments Meds: none Other Therapies: Not applicable  Premature Ovarian Failure Family history of autism, mental retardation, or fragile X: no Family history of premature ovarian failure or early menopause: no Prior radiation or chemotherapy exposoure:  no  Tubal Factor Previous abdominal or pelvic surgery: no Pelvic Pain:  no Endometriosis: no STD: no PID: no Laparoscopy: no Prior HSG: no  Female Factor Sired prior conception:  yes Semen analysis: no  Contraception None  Review of Systems: 10 point review of systems negative unless otherwise noted in HPI  Past Medical History:  Patient Active Problem List   Diagnosis Date Noted   Recurrent pansinusitis 10/08/2020   Abnormal biochemical finding on antenatal screening of mother 04/01/2020   Vaginal candida 03/18/2019   CIN I (cervical  intraepithelial neoplasia I) 02/13/2019    On 02/2017 colposcopy     Acute otitis media 01/06/2019   Acute upper respiratory infection 12/08/2018   Fever and chills 12/08/2018   Sore throat 12/08/2018   Hiatal hernia 06/23/2018   Left hip pain 06/23/2018   Needs flu shot 06/23/2018   Uterine fibroid 06/13/2018    Fibroid 1: submucosal, on the Left posterior uterine wall = 33.58 x 31.93 mm Fibroid 2: Intramural vs subserosal, on the fudal Left uterine wall = 30.58 x 26.43 mm Fibroid 3: intramural vs Subserosal, on the fundal uterine wall = 6.81 x 5.92 mm     IBS (irritable bowel syndrome) 05/29/2018   Low grade squamous intraepithelial lesion (LGSIL) on cervical Pap smear 05/27/2018    With positive HRHPV     Encounter for general adult medical examination with abnormal findings 04/03/2018   Gastroesophageal reflux disease without esophagitis 04/03/2018   Chronic midline low back pain without sciatica 04/03/2018   Chronic midline thoracic back pain 04/03/2018   Urinary tract infection without hematuria 04/03/2018   Dysuria 04/03/2018   Chest pain 01/08/2018   Epigastric abdominal pain 01/08/2018   Abnormal findings-gastrointestinal tract    Rathke's cleft cyst ([place]) 02/02/2016   Anemia 04/29/2014    Overview:  Diagnosed with both Fe defic and B12 defic per pt  Overview:  Diagnosed with both Fe defic and B12 defic per pt  Diagnosed with both Fe defic and B12 defic per pt Diagnosed with both Fe defic and B12 defic per pt  Overview:  Diagnosed with both Fe defic and B12 defic per pt     Fatigue 04/29/2014   Hyperprolactinemia ([place]) 04/29/2014   Vitamin B 12  deficiency 04/29/2014   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 04/29/2014   Pituitary lesion ([place]) 02/19/2014   New onset of headaches 02/19/2014   Other enthesopathy of unspecified foot 12/18/2013   Pain in foot 12/18/2013   Dupuytren's contracture of foot 12/18/2013   Plantar  fasciitis 12/18/2013   Calcaneal bursitis 12/18/2013   Plantar fascial fibromatosis 12/18/2013    Past Surgical History:  Past Surgical History:  Procedure Laterality Date   COLONOSCOPY     CYSTOSCOPY  05/10/2016   WNL   DILATION AND EVACUATION N/A 04/13/2020   Procedure: SUCTION DILATION AND CURETTAGE;  Surgeon: [name], MD;  Location: [facility];  Service: Gynecology;  Laterality: N/A;   ESOPHAGOGASTRODUODENOSCOPY (EGD) WITH PROPOFOL N/A 02/18/2016   Procedure: ESOPHAGOGASTRODUODENOSCOPY (EGD) WITH PROPOFOL;  Surgeon: Lucilla Lame, MD;  Location: [place];  Service: Endoscopy;  Laterality: N/A;   TONSILLECTOMY  2017   TONSILLECTOMY      Family History:  Family History  Problem Relation Age of Onset   Diabetes Mother    Hypertension Mother    Congestive Heart Failure Mother    CVA Father        Had meningioma   Diabetes Father    Hypertension Father    Bladder Cancer Paternal Aunt    Colon cancer Neg Hx    Esophageal cancer Neg Hx    Stomach cancer Neg Hx    Rectal cancer Neg Hx     Social History:  Social History   Socioeconomic History   Marital status: Single    Spouse name: Not on file   Number of children: 0   Years of education: 14   Highest education level: Not on file  Occupational History   Occupation: Chemical engineer  Tobacco Use   Smoking status: Never   Smokeless tobacco: Never  Vaping Use   Vaping Use: Never used  Substance and Sexual Activity   Alcohol use: No   Drug use: No   Sexual activity: Yes    Birth control/protection: None  Other Topics Concern   Not on file  Social History Narrative   Not on file   Social Determinants of Health   Financial Resource Strain: Not on file  Food Insecurity: Not on file  Transportation Needs: Not on file  Physical Activity: Not on file  Stress: Not on file  Social Connections: Not on file  Intimate Partner Violence: Not on file    Allergies:  No Known  Allergies  Medications: Prior to Admission medications   Not on File    Physical Exam Vitals: Blood pressure 127/84, height 5\' 3"  (1.6 m), weight 228 lb (103.4 kg), last menstrual period 02/12/2021.  General: NAD HEENT: normocephalic, anicteric Pulmonary: No increased work of breathing Neurologic: Grossly intact Psychiatric: mood appropriate, affect full  * No order type specified *  Assessment: 35 y.o. G1P0010 presenting for initial infertility evaluation  Problem List Items Addressed This Visit   None Visit Diagnoses     Class 3 severe obesity without serious comorbidity with body mass index (BMI) of 40.0 to 44.9 in adult, unspecified obesity type ([place])    -  Primary   Relevant Orders   Testosterone,Free and Total   Infertility counseling       Relevant Orders   FSH   LH   Estradiol   Inheritest Core(CF97,SMA,FraX)   Anti mullerian hormone   Testosterone,Free and Total   Prolactin   Encounter for other genetic testing of female  for procreative management       Relevant Orders   Inheritest Core(CF97,SMA,FraX)   Encounter for preconception consultation       Relevant Orders   FSH   LH   Estradiol   Inheritest Core(CF97,SMA,FraX)   Anti mullerian hormone   Testosterone,Free and Total   Prolactin   Lesion of pituitary gland (HCC)       Relevant Orders   FSH   LH   Estradiol   Screening for diabetes mellitus           Plan: 1) We discussed the underlying etiologies which may be implicated in a couple experiencing difficulty conceiving.  The average couple will conceive within the span of 1 year with unprotected coitus, with a monthly fecundity rate of 20% or 1 in 5.  Even without further work up or intervention the patient and her partner may be successful in conceiving unassisted, although if an underlying etiology can be identified and addressed fecundity rate may improve.  The work up entails examining for ovulatory function, tubal patency, and ruling out  female factor infertility.  These may be looked at concurrently or sequentially.  The downside of sequential work up is that this method may miss issues if more than one compartment is contributing.  She is aware that tubal factor or moderate to severe female factor infertility will require further consultation with a reproductive endocrinologist.  In the case of anovulation, use of Clomid (clomiphen citrate) or Femara (letrazole) were discussed with the understanding the the later is an off-label, but well supported use.  With either of these drugs the risk of multiples increases from the standard population rate of 2% to approximately 10%, with higher order multiples possible but unlikely.  Both drugs may require some time to titrate to the appropriate dosage to ensure consistent ovulation.  Cycles will be limited to 6 cycles on each drug secondary to decreasing rates of conception after 6 cycles.  In addition should patient be started on ovulation induction with Clomid she was advised to discontinue the drug for any vision changes as this is a rare but potentially permanent side-effect if medication is continued.  We discussed timing of intercourse as well as the use of ovulation predictor kits identify the patient's fertile window each month.     2) Preconception counseling - immunization up to date.  The patient denies any family history of conditions which would warrant preconception genetic counseling or testing on her or her partner.  Instructed to start prenatal vitamins while trying to conceive.    3) Return after [place] imaging ultrasound.    [name], MD, [facility], [facility] 03/09/2021, 4:59 PM

## 2021-03-19 LAB — INHERITEST CORE(CF97,SMA,FRAX)

## 2021-03-19 LAB — LUTEINIZING HORMONE: LH: 3.3 m[IU]/mL

## 2021-03-19 LAB — TESTOSTERONE,FREE AND TOTAL
Testosterone, Free: 0.6 pg/mL (ref 0.0–4.2)
Testosterone: 6 ng/dL — ABNORMAL LOW (ref 8–60)

## 2021-03-19 LAB — ESTRADIOL: Estradiol: 76.3 pg/mL

## 2021-03-19 LAB — PROLACTIN: Prolactin: 26 ng/mL — ABNORMAL HIGH (ref 4.8–23.3)

## 2021-03-19 LAB — FOLLICLE STIMULATING HORMONE: FSH: 4.2 m[IU]/mL

## 2021-03-19 LAB — ANTI MULLERIAN HORMONE: ANTI-MULLERIAN HORMONE (AMH): 0.927 ng/mL

## 2021-04-29 ENCOUNTER — Ambulatory Visit: Payer: Medicare Other | Admitting: Obstetrics and Gynecology

## 2021-04-30 ENCOUNTER — Other Ambulatory Visit: Payer: Self-pay | Admitting: Obstetrics and Gynecology

## 2021-04-30 MED ORDER — NITROFURANTOIN MONOHYD MACRO 100 MG PO CAPS: 100.0000 mg | ORAL_CAPSULE | Freq: Two times a day (BID) | ORAL | 0 refills | Status: AC

## 2021-05-03 ENCOUNTER — Other Ambulatory Visit: Payer: Self-pay

## 2021-05-03 ENCOUNTER — Ambulatory Visit: Payer: Medicare Other | Admitting: Obstetrics and Gynecology

## 2021-05-03 ENCOUNTER — Ambulatory Visit (INDEPENDENT_AMBULATORY_CARE_PROVIDER_SITE_OTHER): Payer: Medicaid Other | Admitting: Obstetrics and Gynecology

## 2021-05-03 ENCOUNTER — Encounter: Payer: Self-pay | Admitting: Obstetrics and Gynecology

## 2021-05-03 VITALS — BP 128/86 | Ht 63.0 in | Wt 233.0 lb

## 2021-05-03 DIAGNOSIS — N970 Female infertility associated with anovulation: Secondary | ICD-10-CM

## 2021-05-03 DIAGNOSIS — D251 Intramural leiomyoma of uterus: Secondary | ICD-10-CM | POA: Diagnosis not present

## 2021-05-03 DIAGNOSIS — Z3169 Encounter for other general counseling and advice on procreation: Secondary | ICD-10-CM | POA: Diagnosis not present

## 2021-05-03 DIAGNOSIS — N939 Abnormal uterine and vaginal bleeding, unspecified: Secondary | ICD-10-CM

## 2021-05-03 MED ORDER — LETROZOLE 2.5 MG PO TABS: 2.5000 mg | ORAL_TABLET | Freq: Every day | ORAL | 0 refills | Status: AC

## 2021-05-03 MED ORDER — LETROZOLE 2.5 MG PO TABS: 2.5000 mg | ORAL_TABLET | Freq: Every day | ORAL | 0 refills | Status: DC

## 2021-05-03 NOTE — Progress Notes (Signed)
Obstetrics & Gynecology Office Visit   Chief Complaint:  Chief Complaint  Patient presents with   Follow-up    BIBC U/S - RM 6    History of Present Illness: Patient is a 35 y.o. female who presents today for ultrasound evaluation of abnormal uterine bleeding.  Ultrasound demonstrates the following findgins Adnexa: no masses seen  Uterus: Enlarged with uterine fibroids with endometrial stripe  non-enlarged.  -Right intramural fibroid versus adenomyoma measures 3.3 x 3.7 x 4.0 cm  -Left fundal partially subserosal, partially intramural fibroid measures 1.9 x 1.7 x 1.8 cm  -Right posterior intramural fibroid measures 0.6 x 0.9 x 1.5 cm  No free fluid   Review of Systems: Review of Systems  Constitutional: Negative.   Gastrointestinal: Negative.   Genitourinary: Negative.     Past Medical History:  Past Medical History:  Diagnosis Date   Back pain    mid and lower back   Chronic abdominal pain    Chronic constipation    Fibroids    one submucosal and one intramural (both  < 1 inch in size)   Headache    caused by pituitary cyst   IBS (irritable bowel syndrome)    Missed abortion    Motion sickness cars   Obesity    Pituitary cyst ([place])    Rathke's pouch cyst    Past Surgical History:  Past Surgical History:  Procedure Laterality Date   COLONOSCOPY     CYSTOSCOPY  05/10/2016   WNL   DILATION AND EVACUATION N/A 04/13/2020   Procedure: SUCTION DILATION AND CURETTAGE;  Surgeon: [name], MD;  Location: [facility];  Service: Gynecology;  Laterality: N/A;   ESOPHAGOGASTRODUODENOSCOPY (EGD) WITH PROPOFOL N/A 02/18/2016   Procedure: ESOPHAGOGASTRODUODENOSCOPY (EGD) WITH PROPOFOL;  Surgeon: Lucilla Lame, MD;  Location: [place];  Service: Endoscopy;  Laterality: N/A;   TONSILLECTOMY  2017   TONSILLECTOMY      Gynecologic History: Patient's last menstrual period was 04/30/2021.  Obstetric History: G1P0010  Family History:  Family History   Problem Relation Age of Onset   Diabetes Mother    Hypertension Mother    Congestive Heart Failure Mother    CVA Father        Had meningioma   Diabetes Father    Hypertension Father    Bladder Cancer Paternal Aunt    Colon cancer Neg Hx    Esophageal cancer Neg Hx    Stomach cancer Neg Hx    Rectal cancer Neg Hx     Social History:  Social History   Socioeconomic History   Marital status: Single    Spouse name: Not on file   Number of children: 0   Years of education: 14   Highest education level: Not on file  Occupational History   Occupation: Chemical engineer  Tobacco Use   Smoking status: Never   Smokeless tobacco: Never  Vaping Use   Vaping Use: Never used  Substance and Sexual Activity   Alcohol use: No   Drug use: No   Sexual activity: Yes    Birth control/protection: None  Other Topics Concern   Not on file  Social History Narrative   Not on file   Social Determinants of Health   Financial Resource Strain: Not on file  Food Insecurity: Not on file  Transportation Needs: Not on file  Physical Activity: Not on file  Stress: Not on file  Social Connections: Not on file  Intimate Partner Violence: Not  on file    Allergies:  No Known Allergies  Medications: Prior to Admission medications   Medication Sig Start Date End Date Taking? Authorizing Provider  nitrofurantoin, macrocrystal-monohydrate, (MACROBID) 100 MG capsule Take 1 capsule (100 mg total) by mouth 2 (two) times daily for 5 days. Patient not taking: Reported on 05/03/2021 04/30/21 05/05/21  [name], MD    Physical Exam Vitals:  Vitals:   05/03/21 1529  BP: 128/86   Patient's last menstrual period was 04/30/2021.  General: NAD HEENT: normocephalic, anicteric Thyroid: no enlargement, no palpable nodules Pulmonary: No increased work of breathing Cardiovascular: RRR, distal pulses 2+ Abdomen: NABS, soft, non-tender, non-distended.  Umbilicus without lesions.  No hepatomegaly,  splenomegaly or masses palpable. No evidence of hernia  Genitourinary:  External: Normal external female genitalia.  Normal urethral meatus, normal  Bartholin's and Skene's glands.    Vagina: Normal vaginal mucosa, no evidence of prolapse.    Cervix: Grossly normal in appearance, no bleeding  Uterus: Non-enlarged, mobile, normal contour.  No CMT  Adnexa: ovaries non-enlarged, no adnexal masses  Rectal: deferred  Lymphatic: no evidence of inguinal lymphadenopathy Extremities: no edema, erythema, or tenderness Neurologic: Grossly intact Psychiatric: mood appropriate, affect full  Female chaperone present for pelvic  portions of the physical exam   EXAM: [place] ENDOVAGINAL (NON-OB)  DATE: 04/27/2021  ACCESSION: [id number]  DICTATED: 04/27/2021 3:22 PM  INTERPRETATION LOCATION: [place]   CLINICAL INDICATION: 35 years old Female with Irregular menstrual cycle  - N92.6 - Irregular menstrual cycle     TECHNIQUE: Ultrasound views of the pelvis were obtained endovaginally using gray scale and color Doppler imaging.   COMPARISON: None available.   FINDINGS:   UTERUS/CERVIX: The uterus is anteverted. Uterus measures 8.4 x 4.1 x 6.4 cm  The endometrium is within normal limits of thickness measuring 0.5 cm.  Diffusely heterogeneous myometrium, which can be seen with adenomyosis. Multiple heterogeneous ill-defined hypoechoic uterine masses are identified, likely representing fibroids.   For reference:  -Right intramural fibroid versus adenomyoma measures 3.3 x 3.7 x 4.0 cm  -Left fundal partially subserosal, partially intramural fibroid measures 1.9 x 1.7 x 1.8 cm  -Right posterior intramural fibroid measures 0.6 x 0.9 x 1.5 cm   OVARIES: Right ovary measures 2.7 x 1.9 x 2.5 cm left ovary measures 3.1 x 1.4 x 1.5 cm Partially limited evaluation of the left ovary secondary to shadowing from uterine fibroids. Small cystic areas were seen within both ovaries compatible with follicles. Probable  dominant follicle versus evolving corpus luteum in the right ovary. Appropriate flow was documented on color Doppler imaging.   OTHER: No abnormal pelvic free fluid. Assessment: 35 y.o. G1P0010 follow up AUB-L, fertility  Plan: Problem List Items Addressed This Visit   None Visit Diagnoses     Female infertility associated with anovulation    -  Primary   Relevant Orders   Progesterone      1) Uterine fibroids - Patient is interested in conceiving in the near future.  Options discussed which would be permissive of future fertility were: 1) Do nothing  2) Do nothing and start letrozole 3) Try hormonal treatment with Barbette Merino or Myfembree 4) Depo Lupron injection for 6 months to shrink fibroids 5) Surgery to remove fibroids (myomectomy)   - Will Rx letrozle 2.'5mg'$  - Day 21 progesterone 9/1 LMP on 7/13 - Briefly discussed implication of uterine fibroids in pregnancy  "Fibroid-related pain requiring medications occurs in 5%-15% of women with fibroids. Use of nonsteroidal anti-inflammatory analgesic  medications may be required and indomethacin (25 mg-50 mg by mouth every 6 hours for up to 48 hours) is a treatment option. Pain that does not improve within 48 hours of medical treatment should prompt the clinician to search for alternative etiologies. Prostaglandin synthase inhibitors should generally be avoided beyond 32 weeks' gestation or restricted for use to fewer than 48 hours due to potential for fetal complications such as oligohydramnios and premature constriction of the ductus arteriosus."  SMFM Fibroids in Pregnancy, meaning and management  2) We discussed the underlying etiologies which may be implicated in a couple experiencing difficulty conceiving.  The average couple will conceive within the span of 1 year with unprotected coitus, with a monthly fecundity rate of 20% or 1 in 5.  Even without further work up or intervention the patient and her partner may be successful in  conceiving unassisted, although if an underlying etiology can be identified and addressed fecundity rate may improve.  The work up entails examining for ovulatory function, tubal patency, and ruling out female factor infertility.  These may be looked at concurrently or sequentially.  The downside of sequential work up is that this method may miss issues if more than one compartment is contributing.  She is aware that tubal factor or moderate to severe female factor infertility will require further consultation with a reproductive endocrinologist.  In the case of anovulation, use of Clomid (clomiphen citrate) or Femara (letrazole) were discussed with the understanding the the later is an off-label, but well supported use.  Current recommendation are to use letrozole first line secondary to increased live birth rate compared to clomid for patients with PCOS ("Polycystic Ovarian Syndrome" ACOG Practice Bulletin 194 June 2018).  With either of these drugs the risk of multiples increases from the standard population rate of 2% to approximately 10%, with higher order multiples possible but unlikely.  Both drugs may require some time to titrate to the appropriate dosage to ensure consistent ovulation.  Cycles will be limited to 6 cycles on each drug secondary to decreasing rates of conception after 6 cycles.  In addition should patient be started on ovulation induction with Clomid she was advised to discontinue the drug for any vision changes as this is a rare but potentially permanent side-effect if medication is continued.  Was counseled that letrozole may cause idiopathic bone pain that generally resolves.  Hot flashes, headaches, and nausea were mentioned as the most commonly encountered side-effects of both drugs.  We discussed timing of intercourse as well as the use of ovulation predictor kits identify the patient's fertile window each month.     3) A total of  20  minutes were spent in face-to-face contact with the  patient during this encounter with over half of that time devoted to counseling and coordination of care.  4) Return in about 13 months (around 05/19/2022) for lab visit.     [name], MD, [facility], [facility] 05/03/2021, 4:00 PM

## 2021-05-03 NOTE — Patient Instructions (Signed)
Email me with your next cycle if you want to start the letrozole at [email or web address]  Fibroids: 1) Do nothing  2) Do nothing and start letrozole 3) Try hormonal treatment with Barbette Merino or Myfembree 4) Depo Lupron injection for 6 months to shrink fibroids 5) Surgery to remove fibroids (myomectomy)

## 2021-05-05 ENCOUNTER — Other Ambulatory Visit: Payer: Self-pay | Admitting: Obstetrics and Gynecology

## 2021-05-09 ENCOUNTER — Other Ambulatory Visit: Payer: Self-pay | Admitting: Obstetrics and Gynecology

## 2021-05-19 ENCOUNTER — Other Ambulatory Visit: Payer: Medicare Other

## 2021-05-19 ENCOUNTER — Other Ambulatory Visit: Payer: Self-pay

## 2021-05-19 DIAGNOSIS — N970 Female infertility associated with anovulation: Secondary | ICD-10-CM

## 2021-05-20 LAB — PROGESTERONE: Progesterone: 9.9 ng/mL

## 2021-05-24 ENCOUNTER — Telehealth: Payer: Self-pay

## 2021-05-24 NOTE — Telephone Encounter (Signed)
Pt calling for results from 05/19/21,  [phone]

## 2021-06-06 ENCOUNTER — Telehealth: Payer: Self-pay

## 2021-06-06 NOTE — Telephone Encounter (Signed)
Left vm to confirm 06/07/21 appointment-Toni 

## 2021-06-07 ENCOUNTER — Encounter: Payer: Self-pay | Admitting: Nurse Practitioner

## 2021-06-23 ENCOUNTER — Other Ambulatory Visit: Payer: Self-pay | Admitting: Obstetrics and Gynecology

## 2021-06-23 MED ORDER — LETROZOLE 2.5 MG PO TABS: 2.5000 mg | ORAL_TABLET | Freq: Every day | ORAL | 0 refills | Status: AC

## 2021-06-27 ENCOUNTER — Other Ambulatory Visit: Payer: Self-pay | Admitting: Obstetrics and Gynecology

## 2021-06-29 ENCOUNTER — Other Ambulatory Visit: Payer: Self-pay | Admitting: Obstetrics and Gynecology

## 2021-06-30 ENCOUNTER — Encounter: Payer: BC Managed Care – PPO | Admitting: Nurse Practitioner

## 2021-08-02 ENCOUNTER — Ambulatory Visit (INDEPENDENT_AMBULATORY_CARE_PROVIDER_SITE_OTHER): Payer: Medicaid Other | Admitting: Obstetrics and Gynecology

## 2021-08-02 ENCOUNTER — Encounter: Payer: Self-pay | Admitting: Obstetrics and Gynecology

## 2021-08-02 ENCOUNTER — Other Ambulatory Visit (HOSPITAL_COMMUNITY)
Admission: RE | Admit: 2021-08-02 | Discharge: 2021-08-02 | Disposition: A | Discharge status: Home / Self Care | Payer: Medicaid Other | Source: Ambulatory Visit | Attending: Obstetrics and Gynecology | Admitting: Obstetrics and Gynecology

## 2021-08-02 ENCOUNTER — Other Ambulatory Visit: Payer: Self-pay

## 2021-08-02 VITALS — BP 120/70 | Ht 63.0 in | Wt 236.0 lb

## 2021-08-02 DIAGNOSIS — B9689 Other specified bacterial agents as the cause of diseases classified elsewhere: Secondary | ICD-10-CM | POA: Insufficient documentation

## 2021-08-02 DIAGNOSIS — Z113 Encounter for screening for infections with a predominantly sexual mode of transmission: Secondary | ICD-10-CM

## 2021-08-02 DIAGNOSIS — R399 Unspecified symptoms and signs involving the genitourinary system: Secondary | ICD-10-CM | POA: Diagnosis not present

## 2021-08-02 DIAGNOSIS — N760 Acute vaginitis: Secondary | ICD-10-CM | POA: Insufficient documentation

## 2021-08-02 LAB — POCT WET PREP WITH KOH
Clue Cells Wet Prep HPF POC: POSITIVE
KOH Prep POC: POSITIVE — AB
Trichomonas, UA: NEGATIVE
Yeast Wet Prep HPF POC: NEGATIVE

## 2021-08-02 LAB — POCT URINALYSIS DIPSTICK
Bilirubin, UA: NEGATIVE
Blood, UA: NEGATIVE
Glucose, UA: NEGATIVE
Ketones, UA: NEGATIVE
Leukocytes, UA: NEGATIVE
Nitrite, UA: NEGATIVE
Protein, UA: NEGATIVE
Spec Grav, UA: 1.02 (ref 1.010–1.025)
pH, UA: 6 (ref 5.0–8.0)

## 2021-08-02 MED ORDER — METRONIDAZOLE 500 MG PO TABS: 500.0000 mg | ORAL_TABLET | Freq: Two times a day (BID) | ORAL | 0 refills | Status: AC

## 2021-08-02 NOTE — Progress Notes (Signed)
Jonetta Osgood, NP   Chief Complaint  Patient presents with   Urinary Tract Infection    Frequency urinating, pelvic and back pain since last week   Vaginal Discharge    Itchiness, no odor since last week    HPI:      [name] is a 35 y.o. G1P0010 whose LMP was Patient's last menstrual period was 07/12/2021 (exact date)., presents today for urinary frequency with good flow for the past wk with LBP and pelvic pain, no dysuria, hematuria, fevers. Drinks minimal caffeine, mostly water and juice. Also with increased d/c with irritation and fishy odor. No meds to treat, no recent abx use.  She is sex active, no new partners but she and partner broke up last wk and pt wants STD testing to be safe.     Patient Active Problem List   Diagnosis Date Noted   BV (bacterial vaginosis) 08/02/2021   Recurrent pansinusitis 10/08/2020   Abnormal biochemical finding on antenatal screening of mother 04/01/2020   Vaginal candida 03/18/2019   CIN I (cervical intraepithelial neoplasia I) 02/13/2019   Acute otitis media 01/06/2019   Acute upper respiratory infection 12/08/2018   Fever and chills 12/08/2018   Sore throat 12/08/2018   Hiatal hernia 06/23/2018   Left hip pain 06/23/2018   Needs flu shot 06/23/2018   Uterine fibroid 06/13/2018   IBS (irritable bowel syndrome) 05/29/2018   Low grade squamous intraepithelial lesion (LGSIL) on cervical Pap smear 05/27/2018   Encounter for general adult medical examination with abnormal findings 04/03/2018   Gastroesophageal reflux disease without esophagitis 04/03/2018   Chronic midline low back pain without sciatica 04/03/2018   Chronic midline thoracic back pain 04/03/2018   Urinary tract infection without hematuria 04/03/2018   Dysuria 04/03/2018   Chest pain 01/08/2018   Epigastric abdominal pain 01/08/2018   Abnormal findings-gastrointestinal tract    Rathke's cleft cyst ([place]) 02/02/2016   Anemia 04/29/2014   Fatigue  04/29/2014   Hyperprolactinemia ([place]) 04/29/2014   Vitamin B 12 deficiency 04/29/2014   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 04/29/2014   Pituitary lesion ([place]) 02/19/2014   New onset of headaches 02/19/2014   Other enthesopathy of unspecified foot 12/18/2013   Pain in foot 12/18/2013   Dupuytren's contracture of foot 12/18/2013   Plantar fasciitis 12/18/2013   Calcaneal bursitis 12/18/2013   Plantar fascial fibromatosis 12/18/2013    Past Surgical History:  Procedure Laterality Date   COLONOSCOPY     CYSTOSCOPY  05/10/2016   WNL   DILATION AND EVACUATION N/A 04/13/2020   Procedure: SUCTION DILATION AND CURETTAGE;  Surgeon: [name], MD;  Location: [facility];  Service: Gynecology;  Laterality: N/A;   ESOPHAGOGASTRODUODENOSCOPY (EGD) WITH PROPOFOL N/A 02/18/2016   Procedure: ESOPHAGOGASTRODUODENOSCOPY (EGD) WITH PROPOFOL;  Surgeon: Lucilla Lame, MD;  Location: [place];  Service: Endoscopy;  Laterality: N/A;   TONSILLECTOMY  2017   TONSILLECTOMY      Family History  Problem Relation Age of Onset   Diabetes Mother    Hypertension Mother    Congestive Heart Failure Mother    CVA Father        Had meningioma   Diabetes Father    Hypertension Father    Bladder Cancer Paternal Aunt    Colon cancer Neg Hx    Esophageal cancer Neg Hx    Stomach cancer Neg Hx    Rectal cancer Neg Hx     Social History   Socioeconomic  History   Marital status: Single    Spouse name: Not on file   Number of children: 0   Years of education: 14   Highest education level: Not on file  Occupational History   Occupation: sales associate  Tobacco Use   Smoking status: Never   Smokeless tobacco: Never  Vaping Use   Vaping Use: Never used  Substance and Sexual Activity   Alcohol use: No   Drug use: No   Sexual activity: Yes    Birth control/protection: None  Other Topics Concern   Not on file  Social History Narrative   Not on file    Social Determinants of Health   Financial Resource Strain: Not on file  Food Insecurity: Not on file  Transportation Needs: Not on file  Physical Activity: Not on file  Stress: Not on file  Social Connections: Not on file  Intimate Partner Violence: Not on file    No outpatient medications prior to visit.   No facility-administered medications prior to visit.      ROS:  Review of Systems  Constitutional:  Negative for fever.  Gastrointestinal:  Negative for blood in stool, constipation, diarrhea, nausea and vomiting.  Genitourinary:  Positive for frequency, pelvic pain and vaginal discharge. Negative for dyspareunia, dysuria, flank pain, hematuria, urgency, vaginal bleeding and vaginal pain.  Musculoskeletal:  Positive for back pain.  Skin:  Negative for rash.  BREAST: No symptoms   OBJECTIVE:   Vitals:  BP 120/70   Ht 5\' 3"  (1.6 m)   Wt 236 lb (107 kg)   LMP 07/12/2021 (Exact Date)   BMI 41.81 kg/m   Physical Exam Vitals reviewed.  Constitutional:      Appearance: She is well-developed. She is not ill-appearing or toxic-appearing.  Pulmonary:     Effort: Pulmonary effort is normal.  Abdominal:     Palpations: Abdomen is soft.     Tenderness: There is abdominal tenderness in the suprapubic area. There is no right CVA tenderness or left CVA tenderness.  Genitourinary:    General: Normal vulva.     Pubic Area: No rash.      Labia:        Right: No rash, tenderness or lesion.        Left: No rash, tenderness or lesion.      Vagina: Vaginal discharge present. No erythema or tenderness.     Cervix: Normal.     Uterus: Normal. Tender. Not enlarged.      Adnexa: Right adnexa normal and left adnexa normal.       Right: No mass or tenderness.         Left: No mass or tenderness.    Musculoskeletal:        General: Normal range of motion.     Cervical back: Normal range of motion.  Skin:    General: Skin is warm and dry.  Neurological:     General: No focal  deficit present.     Mental Status: She is alert and oriented to person, place, and time.     Cranial Nerves: No cranial nerve deficit.  Psychiatric:        Mood and Affect: Mood normal.        Behavior: Behavior normal.        Thought Content: Thought content normal.        Judgment: Judgment normal.    Results: Results for orders placed or performed in visit on 08/02/21 (from the past 24 hour(s))  POCT Wet Prep with KOH     Status: Abnormal   Collection Time: 08/02/21  4:03 PM  Result Value Ref Range   Trichomonas, UA Negative    Clue Cells Wet Prep HPF POC pos    Epithelial Wet Prep HPF POC     Yeast Wet Prep HPF POC neg    Bacteria Wet Prep HPF POC     RBC Wet Prep HPF POC     WBC Wet Prep HPF POC     KOH Prep POC Positive (A) Negative  POCT Urinalysis Dipstick     Status: Normal   Collection Time: 08/02/21  4:04 PM  Result Value Ref Range   Color, UA yellow    Clarity, UA clear    Glucose, UA Negative Negative   Bilirubin, UA neg    Ketones, UA neg    Spec Grav, UA 1.020 1.010 - 1.025   Blood, UA neg    pH, UA 6.0 5.0 - 8.0   Protein, UA Negative Negative   Urobilinogen, UA     Nitrite, UA neg    Leukocytes, UA Negative Negative   Appearance     Odor       Assessment/Plan: BV (bacterial vaginosis) - Plan: metroNIDAZOLE (FLAGYL) 500 MG tablet, POCT Wet Prep with KOH; pos sx and wet prep. Rx flagyl, no EtOH. Will RF if sx recur. May be cause of pelvic pain; f/u if sx persist after tx.   UTI symptoms - Plan: POCT Urinalysis Dipstick, Urine Culture; neg UA. Check C&S. Will f/u if pos.   Screening for STD (sexually transmitted disease) - Plan: Cervicovaginal ancillary only    Meds ordered this encounter  Medications   metroNIDAZOLE (FLAGYL) 500 MG tablet    Sig: Take 1 tablet (500 mg total) by mouth 2 (two) times daily for 7 days.    Dispense:  14 tablet    Refill:  0    Order Specific Question:   Supervising Provider    Answer:   Gae Dry [[id number]]       Return if symptoms worsen or fail to improve.  Kellsey Sansone B. Jerad Dunlap, PA-C 08/02/2021 4:05 PM

## 2021-08-04 LAB — CERVICOVAGINAL ANCILLARY ONLY
Chlamydia: NEGATIVE
Comment: NEGATIVE
Comment: NEGATIVE
Comment: NORMAL
Neisseria Gonorrhea: NEGATIVE
Trichomonas: NEGATIVE

## 2021-08-06 LAB — URINE CULTURE

## 2021-08-16 ENCOUNTER — Encounter: Payer: Self-pay | Admitting: Nurse Practitioner

## 2021-08-16 ENCOUNTER — Ambulatory Visit (INDEPENDENT_AMBULATORY_CARE_PROVIDER_SITE_OTHER): Payer: BC Managed Care – PPO | Admitting: Nurse Practitioner

## 2021-08-16 ENCOUNTER — Other Ambulatory Visit: Payer: Self-pay

## 2021-08-16 VITALS — BP 124/84 | HR 105 | Temp 98.7°F | Resp 16 | Ht 63.0 in | Wt 233.0 lb

## 2021-08-16 DIAGNOSIS — R6889 Other general symptoms and signs: Secondary | ICD-10-CM | POA: Diagnosis not present

## 2021-08-16 DIAGNOSIS — J0110 Acute frontal sinusitis, unspecified: Secondary | ICD-10-CM | POA: Diagnosis not present

## 2021-08-16 DIAGNOSIS — R051 Acute cough: Secondary | ICD-10-CM | POA: Diagnosis not present

## 2021-08-16 LAB — POCT INFLUENZA A/B
Influenza A, POC: NEGATIVE
Influenza B, POC: NEGATIVE

## 2021-08-16 MED ORDER — BENZONATATE 100 MG PO CAPS: 100.0000 mg | ORAL_CAPSULE | Freq: Two times a day (BID) | ORAL | 0 refills | Status: DC | PRN

## 2021-08-16 MED ORDER — HYDROCOD POLST-CPM POLST ER 10-8 MG/5ML PO SUER: 5.0000 mL | Freq: Two times a day (BID) | ORAL | 0 refills | Status: DC | PRN

## 2021-08-16 MED ORDER — AMOXICILLIN-POT CLAVULANATE 875-125 MG PO TABS
1.0000 | ORAL_TABLET | Freq: Two times a day (BID) | ORAL | 0 refills | Status: DC
Start: 2021-08-16 — End: 2021-10-20

## 2021-08-16 NOTE — Progress Notes (Signed)
[facility] [place], [place] [address]  Internal MEDICINE  Office Visit Note  Patient Name: Shannon Foley  [id number]  [id number]  Date of Service: 08/16/2021  Chief Complaint  Patient presents with   Acute Visit    Possible flu or sinus infection, lower abd pain, lower back pain, pt went to GYN and they told her it was BV   Sinusitis     HPI Shannon Foley presents for an acute sick visit for symptoms of sinusitis.  She also has lower abdominal pain, back pain and when she went to her gynecologist she was told she had bacterial vaginosis.  Her symptoms of sinusitis started around November 11 and have persisted for the past couple of weeks.  She reports having nasal congestion, postnasal drip, runny nose, sneezing, cough, headaches, and chills.  She would like to be tested for the flu and would like to be prescribed something for her cough.   Current Medication:  Outpatient Encounter Medications as of 08/16/2021  Medication Sig   amoxicillin-clavulanate (AUGMENTIN) 875-125 MG tablet Take 1 tablet by mouth 2 (two) times daily.   benzonatate (TESSALON) 100 MG capsule Take 1 capsule (100 mg total) by mouth 2 (two) times daily as needed for cough.   chlorpheniramine-HYDROcodone (TUSSIONEX PENNKINETIC ER) 10-8 MG/5ML SUER Take 5 mLs by mouth every 12 (twelve) hours as needed for cough.   No facility-administered encounter medications on file as of 08/16/2021.      Medical History: Past Medical History:  Diagnosis Date   Back pain    mid and lower back   Chronic abdominal pain    Chronic constipation    Fibroids    one submucosal and one intramural (both  < 1 inch in size)   Headache    caused by pituitary cyst   IBS (irritable bowel syndrome)    Missed abortion    Motion sickness cars   Obesity    Pituitary cyst ([place])    Rathke's pouch cyst     Vital Signs: BP 124/84   Pulse (!) 105   Temp 98.7 F (37.1 C)   Resp 16   Ht 5\' 3"  (1.6 m)    Wt 233 lb (105.7 kg)   SpO2 99%   BMI 41.27 kg/m    Review of Systems  Constitutional:  Positive for chills. Negative for fever.  HENT:  Positive for congestion, postnasal drip, rhinorrhea and sneezing. Negative for sinus pressure, sinus pain, sore throat and trouble swallowing.   Eyes:  Negative for pain.  Respiratory:  Positive for cough. Negative for chest tightness, wheezing and stridor.   Cardiovascular: Negative.  Negative for chest pain and palpitations.  Gastrointestinal:  Positive for nausea and vomiting. Negative for abdominal pain, constipation and diarrhea.  Musculoskeletal:  Positive for myalgias.  Neurological:  Positive for headaches. Negative for dizziness and numbness.   Physical Exam Constitutional:      General: She is not in acute distress.    Appearance: Normal appearance. She is ill-appearing.  HENT:     Head: Normocephalic and atraumatic.     Right Ear: Tympanic membrane, ear canal and external ear normal.     Left Ear: Tympanic membrane, ear canal and external ear normal.     Nose: Congestion and rhinorrhea present.     Mouth/Throat:     Mouth: Mucous membranes are moist.     Pharynx: Posterior oropharyngeal erythema present.  Eyes:     Pupils: Pupils are equal, round, and  reactive to light.  Cardiovascular:     Rate and Rhythm: Normal rate and regular rhythm.     Pulses: Normal pulses.     Heart sounds: Normal heart sounds. No murmur heard. Pulmonary:     Effort: Pulmonary effort is normal. No respiratory distress.     Breath sounds: Normal breath sounds. No wheezing.  Neurological:     Mental Status: She is alert and oriented to person, place, and time.     Cranial Nerves: No cranial nerve deficit.     Gait: Gait normal.  Psychiatric:        Mood and Affect: Mood normal.        Behavior: Behavior normal.      Assessment/Plan: 1. Acute non-recurrent frontal sinusitis Empiric antibiotic treatment prescribed. - amoxicillin-clavulanate  (AUGMENTIN) 875-125 MG tablet; Take 1 tablet by mouth 2 (two) times daily.  Dispense: 20 tablet; Refill: 0  2. Flu-like symptoms Due to flulike symptoms, patient tested for flu and was negative for flu a and B - POCT Influenza A/B  3. Acute cough Medication prescribed for symptomatic relief of cough - chlorpheniramine-HYDROcodone (TUSSIONEX PENNKINETIC ER) 10-8 MG/5ML SUER; Take 5 mLs by mouth every 12 (twelve) hours as needed for cough.  Dispense: 140 mL; Refill: 0 - benzonatate (TESSALON) 100 MG capsule; Take 1 capsule (100 mg total) by mouth 2 (two) times daily as needed for cough.  Dispense: 30 capsule; Refill: 0   General Counseling: Shannon Foley verbalizes understanding of the findings of todays visit and agrees with plan of treatment. I have discussed any further diagnostic evaluation that may be needed or ordered today. We also reviewed her medications today. she has been encouraged to call the office with any questions or concerns that should arise related to todays visit.    Counseling:    Orders Placed This Encounter  Procedures   POCT Influenza A/B    Meds ordered this encounter  Medications   amoxicillin-clavulanate (AUGMENTIN) 875-125 MG tablet    Sig: Take 1 tablet by mouth 2 (two) times daily.    Dispense:  20 tablet    Refill:  0   chlorpheniramine-HYDROcodone (TUSSIONEX PENNKINETIC ER) 10-8 MG/5ML SUER    Sig: Take 5 mLs by mouth every 12 (twelve) hours as needed for cough.    Dispense:  140 mL    Refill:  0   benzonatate (TESSALON) 100 MG capsule    Sig: Take 1 capsule (100 mg total) by mouth 2 (two) times daily as needed for cough.    Dispense:  30 capsule    Refill:  0    Return if symptoms worsen or fail to improve.  [place] Controlled Substance Database was reviewed by me for overdose risk score (ORS)  Time spent:30 Minutes Time spent with patient included reviewing progress notes, labs, imaging studies, and discussing plan for follow up.   This patient  was seen by Jonetta Osgood, FNP-C in collaboration with Dr. Clayborn Bigness as a part of collaborative care agreement.  Gulianna Hornsby R. Valetta Fuller, MSN, FNP-C Internal Medicine

## 2021-08-30 ENCOUNTER — Ambulatory Visit: Payer: Medicaid Other | Admitting: Gastroenterology

## 2021-08-31 ENCOUNTER — Encounter: Payer: Self-pay | Admitting: Obstetrics and Gynecology

## 2021-09-07 ENCOUNTER — Telehealth: Payer: Self-pay

## 2021-09-07 NOTE — Telephone Encounter (Signed)
Left vm and sent mychart message to confirm 09/08/21 appointment-Toni

## 2021-09-08 ENCOUNTER — Encounter: Payer: BC Managed Care – PPO | Admitting: Nurse Practitioner

## 2021-09-13 ENCOUNTER — Encounter: Payer: Self-pay | Admitting: Internal Medicine

## 2021-09-15 ENCOUNTER — Encounter: Payer: Self-pay | Admitting: Nurse Practitioner

## 2021-09-28 ENCOUNTER — Ambulatory Visit (INDEPENDENT_AMBULATORY_CARE_PROVIDER_SITE_OTHER): Payer: Medicaid Other | Admitting: Obstetrics and Gynecology

## 2021-09-28 ENCOUNTER — Encounter: Payer: Self-pay | Admitting: Obstetrics and Gynecology

## 2021-09-28 ENCOUNTER — Other Ambulatory Visit: Payer: Self-pay

## 2021-09-28 DIAGNOSIS — R399 Unspecified symptoms and signs involving the genitourinary system: Secondary | ICD-10-CM

## 2021-09-28 LAB — POCT URINALYSIS DIPSTICK
Bilirubin, UA: NEGATIVE
Glucose, UA: NEGATIVE
Ketones, UA: NEGATIVE
Nitrite, UA: POSITIVE
Protein, UA: POSITIVE — AB
Spec Grav, UA: 1.025 (ref 1.010–1.025)
Urobilinogen, UA: 0.2 E.U./dL
pH, UA: 5 (ref 5.0–8.0)

## 2021-09-28 MED ORDER — NITROFURANTOIN MONOHYD MACRO 100 MG PO CAPS: 100.0000 mg | ORAL_CAPSULE | Freq: Two times a day (BID) | ORAL | 0 refills | Status: AC

## 2021-09-28 NOTE — Progress Notes (Signed)
Pt aware.

## 2021-09-28 NOTE — Progress Notes (Signed)
Pls send for C&S and notify pt macrobid eRxd. F/u prn.

## 2021-09-28 NOTE — Progress Notes (Signed)
Pt with UTI sx, pos UA with WBC, nitrites. Rx macrobid, check C&S

## 2021-09-30 DIAGNOSIS — E236 Other disorders of pituitary gland: Secondary | ICD-10-CM | POA: Diagnosis not present

## 2021-09-30 DIAGNOSIS — D352 Benign neoplasm of pituitary gland: Secondary | ICD-10-CM | POA: Diagnosis not present

## 2021-09-30 DIAGNOSIS — E237 Disorder of pituitary gland, unspecified: Secondary | ICD-10-CM | POA: Diagnosis not present

## 2021-09-30 LAB — URINE CULTURE

## 2021-10-03 ENCOUNTER — Ambulatory Visit: Payer: Medicaid Other | Admitting: Gastroenterology

## 2021-10-07 ENCOUNTER — Ambulatory Visit: Payer: Medicaid Other | Admitting: Gastroenterology

## 2021-10-17 ENCOUNTER — Telehealth: Payer: Self-pay

## 2021-10-17 NOTE — Telephone Encounter (Signed)
Left vm to confirm 10/20/21 appointment-Toni

## 2021-10-20 ENCOUNTER — Encounter: Payer: Self-pay | Admitting: Nurse Practitioner

## 2021-10-20 ENCOUNTER — Ambulatory Visit (INDEPENDENT_AMBULATORY_CARE_PROVIDER_SITE_OTHER): Payer: BC Managed Care – PPO | Admitting: Nurse Practitioner

## 2021-10-20 ENCOUNTER — Other Ambulatory Visit: Payer: Self-pay

## 2021-10-20 VITALS — BP 136/86 | HR 85 | Temp 98.3°F | Resp 16 | Ht 63.0 in | Wt 235.0 lb

## 2021-10-20 DIAGNOSIS — Z0001 Encounter for general adult medical examination with abnormal findings: Secondary | ICD-10-CM

## 2021-10-20 DIAGNOSIS — M545 Low back pain, unspecified: Secondary | ICD-10-CM | POA: Diagnosis not present

## 2021-10-20 DIAGNOSIS — R079 Chest pain, unspecified: Secondary | ICD-10-CM | POA: Diagnosis not present

## 2021-10-20 DIAGNOSIS — S29011A Strain of muscle and tendon of front wall of thorax, initial encounter: Secondary | ICD-10-CM

## 2021-10-20 DIAGNOSIS — R631 Polydipsia: Secondary | ICD-10-CM | POA: Diagnosis not present

## 2021-10-20 DIAGNOSIS — R300 Dysuria: Secondary | ICD-10-CM | POA: Diagnosis not present

## 2021-10-20 DIAGNOSIS — R0789 Other chest pain: Secondary | ICD-10-CM | POA: Diagnosis not present

## 2021-10-20 DIAGNOSIS — E538 Deficiency of other specified B group vitamins: Secondary | ICD-10-CM

## 2021-10-20 DIAGNOSIS — E782 Mixed hyperlipidemia: Secondary | ICD-10-CM

## 2021-10-20 DIAGNOSIS — E559 Vitamin D deficiency, unspecified: Secondary | ICD-10-CM

## 2021-10-20 DIAGNOSIS — D649 Anemia, unspecified: Secondary | ICD-10-CM

## 2021-10-20 DIAGNOSIS — G8929 Other chronic pain: Secondary | ICD-10-CM

## 2021-10-20 LAB — POCT URINALYSIS DIPSTICK
Bilirubin, UA: NEGATIVE
Glucose, UA: NEGATIVE
Leukocytes, UA: NEGATIVE
Nitrite, UA: NEGATIVE
Protein, UA: POSITIVE — AB
Spec Grav, UA: 1.015 (ref 1.010–1.025)
Urobilinogen, UA: 0.2 E.U./dL
pH, UA: 8 (ref 5.0–8.0)

## 2021-10-20 LAB — POCT GLYCOSYLATED HEMOGLOBIN (HGB A1C): Hemoglobin A1C: 5.4 % (ref 4.0–5.6)

## 2021-10-20 MED ORDER — TRAMADOL HCL 50 MG PO TABS: 50.0000 mg | ORAL_TABLET | Freq: Four times a day (QID) | ORAL | 0 refills | Status: AC | PRN

## 2021-10-20 MED ORDER — METHOCARBAMOL 500 MG PO TABS: 500.0000 mg | ORAL_TABLET | Freq: Three times a day (TID) | ORAL | 0 refills | Status: DC | PRN

## 2021-10-20 NOTE — Progress Notes (Signed)
[facility] [place], [place] [address]  Internal MEDICINE  Office Visit Note  Patient Name: Shannon Foley  [id number]  [id number]  Date of Service: 10/20/2021  Chief Complaint  Patient presents with   Annual Exam    Lower back pain, abd pain, chest pain, started Monday     HPI Shannon Foley presents for an annual well visit and physical exam. She is a well appearing 36 yo female with IBS and GERD. She is due for routine pap in 2024. And she is not due for a routine mammogrsam for 4 more years.  Patient reports lower back pain, no radiation or sciatica that started on Monday.  She is also experiencing chest pain that she describes as sharp  and shooting on the left side of the chest. EKG done in office was  normal. Patient reports that the pain happens at work while moving around especially when doing repetitive work like folding towels.  She is taking a prenatal vitamin.  She has noticed that she has had increased thirst and has diabetes in her family. A1C checked today and was normal at 5.4    Current Medication: Outpatient Encounter Medications as of 10/20/2021  Medication Sig   benzonatate (TESSALON) 100 MG capsule Take 1 capsule (100 mg total) by mouth 2 (two) times daily as needed for cough.   chlorpheniramine-HYDROcodone (TUSSIONEX PENNKINETIC ER) 10-8 MG/5ML SUER Take 5 mLs by mouth every 12 (twelve) hours as needed for cough.   methocarbamol (ROBAXIN) 500 MG tablet Take 1 tablet (500 mg total) by mouth every 8 (eight) hours as needed for muscle spasms.   traMADol (ULTRAM) 50 MG tablet Take 1 tablet (50 mg total) by mouth every 6 (six) hours as needed for up to 5 days.   [DISCONTINUED] amoxicillin-clavulanate (AUGMENTIN) 875-125 MG tablet Take 1 tablet by mouth 2 (two) times daily.   No facility-administered encounter medications on file as of 10/20/2021.    Surgical History: Past Surgical History:  Procedure Laterality Date   COLONOSCOPY      CYSTOSCOPY  05/10/2016   WNL   DILATION AND EVACUATION N/A 04/13/2020   Procedure: SUCTION DILATION AND CURETTAGE;  Surgeon: [name], MD;  Location: [facility];  Service: Gynecology;  Laterality: N/A;   ESOPHAGOGASTRODUODENOSCOPY (EGD) WITH PROPOFOL N/A 02/18/2016   Procedure: ESOPHAGOGASTRODUODENOSCOPY (EGD) WITH PROPOFOL;  Surgeon: Lucilla Lame, MD;  Location: [facility];  Service: Endoscopy;  Laterality: N/A;   TONSILLECTOMY  2017   TONSILLECTOMY      Medical History: Past Medical History:  Diagnosis Date   Back pain    mid and lower back   Chronic abdominal pain    Chronic constipation    Fibroids    one submucosal and one intramural (both  < 1 inch in size)   Headache    caused by pituitary cyst   IBS (irritable bowel syndrome)    Missed abortion    Motion sickness cars   Obesity    Pituitary cyst ([place])    Rathke's pouch cyst    Family History: Family History  Problem Relation Age of Onset   Diabetes Mother    Hypertension Mother    Congestive Heart Failure Mother    CVA Father        Had meningioma   Diabetes Father    Hypertension Father    Bladder Cancer Paternal Aunt    Colon cancer Neg Hx    Esophageal cancer Neg Hx    Stomach cancer  Neg Hx    Rectal cancer Neg Hx     Social History   Socioeconomic History   Marital status: Single    Spouse name: Not on file   Number of children: 0   Years of education: 14   Highest education level: Not on file  Occupational History   Occupation: sales associate  Tobacco Use   Smoking status: Never   Smokeless tobacco: Never  Vaping Use   Vaping Use: Never used  Substance and Sexual Activity   Alcohol use: No   Drug use: No   Sexual activity: Yes    Birth control/protection: None  Other Topics Concern   Not on file  Social History Narrative   Not on file   Social Determinants of Health   Financial Resource Strain: Not on file  Food Insecurity: Not on file  Transportation Needs: Not on  file  Physical Activity: Not on file  Stress: Not on file  Social Connections: Not on file  Intimate Partner Violence: Not on file      Review of Systems  Constitutional:  Negative for activity change, appetite change, chills, fatigue, fever and unexpected weight change.  HENT: Negative.  Negative for congestion, ear pain, rhinorrhea, sore throat and trouble swallowing.   Eyes: Negative.   Respiratory: Negative.  Negative for cough, chest tightness, shortness of breath and wheezing.   Cardiovascular: Negative.  Negative for chest pain and palpitations.  Gastrointestinal: Negative.  Negative for abdominal pain, blood in stool, constipation, diarrhea, nausea and vomiting.  Endocrine: Positive for polydipsia.  Genitourinary: Negative.  Negative for difficulty urinating, dysuria, frequency, hematuria and urgency.  Musculoskeletal:  Positive for back pain and myalgias. Negative for arthralgias, joint swelling and neck pain.  Skin: Negative.  Negative for rash and wound.  Allergic/Immunologic: Negative.  Negative for immunocompromised state.  Neurological: Negative.  Negative for dizziness, seizures, numbness and headaches.  Hematological: Negative.   Psychiatric/Behavioral: Negative.  Negative for behavioral problems, self-injury and suicidal ideas. The patient is not nervous/anxious.    Vital Signs: BP 136/86    Pulse 85    Temp 98.3 F (36.8 C)    Resp 16    Ht 5' 3"  (1.6 m)    Wt 235 lb (106.6 kg)    SpO2 99%    BMI 41.63 kg/m    Physical Exam Vitals reviewed.  Constitutional:      General: She is awake. She is not in acute distress.    Appearance: Normal appearance. She is well-developed and well-groomed. She is obese. She is not ill-appearing or diaphoretic.  HENT:     Head: Normocephalic and atraumatic.     Right Ear: Tympanic membrane, ear canal and external ear normal.     Left Ear: Tympanic membrane, ear canal and external ear normal.     Nose: Nose normal. No congestion  or rhinorrhea.     Mouth/Throat:     Lips: Pink.     Mouth: Mucous membranes are moist.     Pharynx: Oropharynx is clear. Uvula midline. No oropharyngeal exudate or posterior oropharyngeal erythema.  Eyes:     General: Lids are normal. Vision grossly intact. Gaze aligned appropriately. No scleral icterus.       Right eye: No discharge.        Left eye: No discharge.     Extraocular Movements: Extraocular movements intact.     Conjunctiva/sclera: Conjunctivae normal.     Pupils: Pupils are equal, round, and reactive to light.  Funduscopic exam:    Right eye: Red reflex present.        Left eye: Red reflex present. Neck:     Thyroid: No thyromegaly.     Vascular: No carotid bruit or JVD.     Trachea: No tracheal deviation.  Cardiovascular:     Rate and Rhythm: Normal rate and regular rhythm.     Pulses: Normal pulses.     Heart sounds: Normal heart sounds, S1 normal and S2 normal. No murmur heard.   No friction rub. No gallop.  Pulmonary:     Effort: Pulmonary effort is normal. No accessory muscle usage or respiratory distress.     Breath sounds: Normal breath sounds and air entry. No stridor. No wheezing or rales.  Chest:     Chest wall: No tenderness.  Breasts:    Right: No swelling, bleeding, inverted nipple, mass, nipple discharge, skin change or tenderness.     Left: No swelling, bleeding, inverted nipple, mass, nipple discharge, skin change or tenderness.  Abdominal:     General: Bowel sounds are normal. There is no distension.     Palpations: Abdomen is soft. There is no shifting dullness, fluid wave, mass or pulsatile mass.     Tenderness: There is no abdominal tenderness. There is no guarding or rebound.  Musculoskeletal:        General: No tenderness or deformity. Normal range of motion.     Cervical back: Normal range of motion and neck supple.     Right lower leg: 1+ Pitting Edema present.     Left lower leg: 1+ Pitting Edema present.  Lymphadenopathy:      Cervical: No cervical adenopathy.     Upper Body:     Right upper body: No supraclavicular, axillary or pectoral adenopathy.     Left upper body: No supraclavicular, axillary or pectoral adenopathy.  Skin:    General: Skin is warm and dry.     Capillary Refill: Capillary refill takes less than 2 seconds.     Coloration: Skin is not pale.     Findings: No erythema or rash.  Neurological:     Mental Status: She is alert and oriented to person, place, and time.     Cranial Nerves: No cranial nerve deficit.     Motor: No abnormal muscle tone.     Coordination: Coordination normal.     Gait: Gait normal.     Deep Tendon Reflexes: Reflexes are normal and symmetric.  Psychiatric:        Mood and Affect: Mood and affect normal.        Behavior: Behavior normal. Behavior is cooperative.        Thought Content: Thought content normal.        Judgment: Judgment normal.       Assessment/Plan: 1. Encounter for routine adult health examination with abnormal findings Age-appropriate preventive screenings and vaccinations discussed, annual physical exam completed. Routine labs for health maintenance ordered, see below. PHM updated.  2. Atypical chest pain EKG is normal, chest pain is most likely related to muscle strain from repetitive movements at work. Routine labs ordered.  - EKG 12-Lead - CMP14+EGFR - TSH + free T4  3. Muscle strain of anterior chest wall Pain medication and muscle relaxant prescribed. May also use ice, heat and light stretches.  - methocarbamol (ROBAXIN) 500 MG tablet; Take 1 tablet (500 mg total) by mouth every 8 (eight) hours as needed for muscle spasms.  Dispense: 60 tablet; Refill:  0 - traMADol (ULTRAM) 50 MG tablet; Take 1 tablet (50 mg total) by mouth every 6 (six) hours as needed for up to 5 days.  Dispense: 15 tablet; Refill: 0  4. Chronic bilateral low back pain without sciatica Lumbar spine xray ordered, refer to ortho, muscle relaxer and pain medication  prescribed.  - Ambulatory referral to Orthopedic Surgery - DG Lumbar Spine Complete; Future - methocarbamol (ROBAXIN) 500 MG tablet; Take 1 tablet (500 mg total) by mouth every 8 (eight) hours as needed for muscle spasms.  Dispense: 60 tablet; Refill: 0 - traMADol (ULTRAM) 50 MG tablet; Take 1 tablet (50 mg total) by mouth every 6 (six) hours as needed for up to 5 days.  Dispense: 15 tablet; Refill: 0  5. Polydipsia Routine labs ordered, A1C is normal, 5.4. - POCT glycosylated hemoglobin (Hb A1C) - CMP14+EGFR  6. Anemia, unspecified type Routine labs ordered - CMP14+EGFR - CBC with Differential/Platelet - B12 and Folate Panel - TSH + free T4  7. Vitamin D deficiency Routine lab ordered.  - Vitamin D (25 hydroxy)  8. Vitamin B 12 deficiency Routine labs ordered - CMP14+EGFR - B12 and Folate Panel  9. Mixed hyperlipidemia Routine labs ordered.  - Lipid Profile - CMP14+EGFR - TSH + free T4  10. Dysuria Routine urinalysis done - POCT Urinalysis Dipstick - UA/M w/rflx Culture, Routine - CULTURE, URINE COMPREHENSIVE - Microscopic Examination - Urine Culture, Reflex      General Counseling: Regina verbalizes understanding of the findings of todays visit and agrees with plan of treatment. I have discussed any further diagnostic evaluation that may be needed or ordered today. We also reviewed her medications today. she has been encouraged to call the office with any questions or concerns that should arise related to todays visit.    Orders Placed This Encounter  Procedures   CULTURE, URINE COMPREHENSIVE   Microscopic Examination   Urine Culture, Reflex   DG Lumbar Spine Complete   Lipid Profile   CMP14+EGFR   CBC with Differential/Platelet   Vitamin D (25 hydroxy)   B12 and Folate Panel   TSH + free T4   UA/M w/rflx Culture, Routine   Ambulatory referral to Orthopedic Surgery   POCT Urinalysis Dipstick   POCT glycosylated hemoglobin (Hb A1C)   EKG 12-Lead     Meds ordered this encounter  Medications   methocarbamol (ROBAXIN) 500 MG tablet    Sig: Take 1 tablet (500 mg total) by mouth every 8 (eight) hours as needed for muscle spasms.    Dispense:  60 tablet    Refill:  0   traMADol (ULTRAM) 50 MG tablet    Sig: Take 1 tablet (50 mg total) by mouth every 6 (six) hours as needed for up to 5 days.    Dispense:  15 tablet    Refill:  0    Return in about 6 months (around 04/19/2022) for F/U, Bradan Congrove PCP.   Total time spent:30 Minutes Time spent includes review of chart, medications, test results, and follow up plan with the patient.   [place] Controlled Substance Database was reviewed by me.  This patient was seen by Jonetta Osgood, FNP-C in collaboration with Dr. Clayborn Bigness as a part of collaborative care agreement.  Serra Younan R. Valetta Fuller, MSN, FNP-C Internal medicine

## 2021-10-21 ENCOUNTER — Telehealth: Payer: Self-pay

## 2021-10-21 ENCOUNTER — Encounter: Payer: Self-pay | Admitting: Nurse Practitioner

## 2021-10-21 NOTE — Telephone Encounter (Signed)
Awaiting on 10/20/21 office notes for ortho surgery referral-Toni

## 2021-10-23 ENCOUNTER — Encounter: Payer: Self-pay | Admitting: Nurse Practitioner

## 2021-10-23 LAB — UA/M W/RFLX CULTURE, ROUTINE
Bilirubin, UA: NEGATIVE
Glucose, UA: NEGATIVE
Nitrite, UA: NEGATIVE
Protein,UA: NEGATIVE
RBC, UA: NEGATIVE
Specific Gravity, UA: 1.021 (ref 1.005–1.030)
Urobilinogen, Ur: 0.2 mg/dL (ref 0.2–1.0)
pH, UA: 8 — ABNORMAL HIGH (ref 5.0–7.5)

## 2021-10-23 LAB — MICROSCOPIC EXAMINATION
Bacteria, UA: NONE SEEN
Casts: NONE SEEN /lpf

## 2021-10-23 LAB — URINE CULTURE, REFLEX

## 2021-10-24 ENCOUNTER — Telehealth: Payer: Self-pay

## 2021-10-24 NOTE — Telephone Encounter (Signed)
Emailing to her now

## 2021-10-24 NOTE — Telephone Encounter (Signed)
Referral sent via [facility] to [facility]

## 2021-10-24 NOTE — Telephone Encounter (Signed)
Per patient's request. I emailed work note to her-Shannon Foley

## 2021-10-25 ENCOUNTER — Ambulatory Visit: Payer: Medicaid Other | Admitting: Gastroenterology

## 2021-10-26 LAB — CULTURE, URINE COMPREHENSIVE

## 2021-10-28 NOTE — Telephone Encounter (Signed)
Referral closed by [facility] due to patient not responding. They left 2 vms & sent sms message-Toni

## 2021-10-31 DIAGNOSIS — E782 Mixed hyperlipidemia: Secondary | ICD-10-CM | POA: Diagnosis not present

## 2021-10-31 DIAGNOSIS — R631 Polydipsia: Secondary | ICD-10-CM | POA: Diagnosis not present

## 2021-10-31 DIAGNOSIS — R079 Chest pain, unspecified: Secondary | ICD-10-CM | POA: Diagnosis not present

## 2021-10-31 DIAGNOSIS — E538 Deficiency of other specified B group vitamins: Secondary | ICD-10-CM | POA: Diagnosis not present

## 2021-10-31 DIAGNOSIS — E559 Vitamin D deficiency, unspecified: Secondary | ICD-10-CM | POA: Diagnosis not present

## 2021-10-31 DIAGNOSIS — D649 Anemia, unspecified: Secondary | ICD-10-CM | POA: Diagnosis not present

## 2021-10-31 DIAGNOSIS — R300 Dysuria: Secondary | ICD-10-CM | POA: Diagnosis not present

## 2021-11-01 ENCOUNTER — Other Ambulatory Visit: Payer: Self-pay

## 2021-11-01 ENCOUNTER — Telehealth: Payer: Self-pay

## 2021-11-01 LAB — CBC WITH DIFFERENTIAL/PLATELET
Basophils Absolute: 0 10*3/uL (ref 0.0–0.2)
Basos: 1 %
EOS (ABSOLUTE): 0.2 10*3/uL (ref 0.0–0.4)
Eos: 2 %
Hematocrit: 42.1 % (ref 34.0–46.6)
Hemoglobin: 14.1 g/dL (ref 11.1–15.9)
Immature Grans (Abs): 0 10*3/uL (ref 0.0–0.1)
Immature Granulocytes: 0 %
Lymphocytes Absolute: 2.8 10*3/uL (ref 0.7–3.1)
Lymphs: 31 %
MCH: 28.9 pg (ref 26.6–33.0)
MCHC: 33.5 g/dL (ref 31.5–35.7)
MCV: 86 fL (ref 79–97)
Monocytes Absolute: 0.7 10*3/uL (ref 0.1–0.9)
Monocytes: 8 %
Neutrophils Absolute: 5.1 10*3/uL (ref 1.4–7.0)
Neutrophils: 58 %
Platelets: 467 10*3/uL — ABNORMAL HIGH (ref 150–450)
RBC: 4.88 x10E6/uL (ref 3.77–5.28)
RDW: 13.3 % (ref 11.7–15.4)
WBC: 8.8 10*3/uL (ref 3.4–10.8)

## 2021-11-01 LAB — TSH+FREE T4
Free T4: 0.94 ng/dL (ref 0.82–1.77)
TSH: 3.07 u[IU]/mL (ref 0.450–4.500)

## 2021-11-01 LAB — VITAMIN D 25 HYDROXY (VIT D DEFICIENCY, FRACTURES): Vit D, 25-Hydroxy: 15.1 ng/mL — ABNORMAL LOW (ref 30.0–100.0)

## 2021-11-01 LAB — LIPID PANEL
Chol/HDL Ratio: 2.4 ratio (ref 0.0–4.4)
Cholesterol, Total: 180 mg/dL (ref 100–199)
HDL: 74 mg/dL (ref >39)
LDL Chol Calc (NIH): 98 mg/dL (ref 0–99)
Triglycerides: 38 mg/dL (ref 0–149)
VLDL Cholesterol Cal: 8 mg/dL (ref 5–40)

## 2021-11-01 LAB — CMP14+EGFR
ALT: 7 IU/L (ref 0–32)
AST: 14 IU/L (ref 0–40)
Albumin/Globulin Ratio: 1.3 (ref 1.2–2.2)
Albumin: 4.2 g/dL (ref 3.8–4.8)
Alkaline Phosphatase: 88 IU/L (ref 44–121)
BUN/Creatinine Ratio: 13 (ref 9–23)
BUN: 10 mg/dL (ref 6–20)
Bilirubin Total: 0.3 mg/dL (ref 0.0–1.2)
CO2: 20 mmol/L (ref 20–29)
Calcium: 9.2 mg/dL (ref 8.7–10.2)
Chloride: 103 mmol/L (ref 96–106)
Creatinine, Ser: 0.79 mg/dL (ref 0.57–1.00)
Globulin, Total: 3.2 g/dL (ref 1.5–4.5)
Glucose: 90 mg/dL (ref 70–99)
Potassium: 4.5 mmol/L (ref 3.5–5.2)
Sodium: 135 mmol/L (ref 134–144)
Total Protein: 7.4 g/dL (ref 6.0–8.5)
eGFR: 99 mL/min/{1.73_m2} (ref >59)

## 2021-11-01 LAB — B12 AND FOLATE PANEL
Folate: 7.7 ng/mL (ref >3.0)
Vitamin B-12: 301 pg/mL (ref 232–1245)

## 2021-11-01 MED ORDER — ERGOCALCIFEROL 1.25 MG (50000 UT) PO CAPS: 50000.0000 [IU] | ORAL_CAPSULE | ORAL | 5 refills | Status: DC

## 2021-11-01 NOTE — Telephone Encounter (Signed)
-----   Message from Jonetta Osgood, NP sent at 11/01/2021  5:56 AM EST ----- Please call patient with results:  -thyroid levels are normal -cholesterol levels are normal -metabolic panel is normal. Kidney and liver function are normal. -CBC is grossly normal, she has no anemia, platelets are only slightly elevated, this level can fluctuate some times is not clinically significant at this time.  -vitamin D level is significantly low at 15.1. please send prescription for vitamin D 50,000 unit capsule, take 1 capsule by mouth once weekly. Will repeat level in 3-6 months.  -B12 is borderline low. Recommend monthly B12 injections for at least 3 months, please have her schedule a nurse visit if she is agreeable. Will repeat level in 3-6 months.  -Folate level is normal for now.

## 2021-11-01 NOTE — Progress Notes (Signed)
Please call patient with results:  -thyroid levels are normal -cholesterol levels are normal -metabolic panel is normal. Kidney and liver function are normal. -CBC is grossly normal, she has no anemia, platelets are only slightly elevated, this level can fluctuate some times is not clinically significant at this time.  -vitamin D level is significantly low at 15.1. please send prescription for vitamin D 50,000 unit capsule, take 1 capsule by mouth once weekly. Will repeat level in 3-6 months.  -B12 is borderline low. Recommend monthly B12 injections for at least 3 months, please have her schedule a nurse visit if she is agreeable. Will repeat level in 3-6 months.  -Folate level is normal for now.

## 2021-11-01 NOTE — Telephone Encounter (Signed)
LMOM need to review results with pt. Sent Vitamin D to pharmacy. Pt needs appt for B12

## 2021-11-01 NOTE — Telephone Encounter (Signed)
Pt informed of her lab results and that her Vit D level was low and we sent Drisdol to the pharmacy and also her B12 was low and need to schedule nurse visits for once a month for 3 month for b12 injections.  Pt spoke with Vivien Rota and got appts scheduled

## 2021-11-03 ENCOUNTER — Ambulatory Visit: Payer: BC Managed Care – PPO

## 2021-11-04 ENCOUNTER — Encounter: Payer: Self-pay | Admitting: Nurse Practitioner

## 2021-11-04 MED ORDER — CYANOCOBALAMIN 1000 MCG/ML IJ SOLN: 1000.0000 ug | INTRAMUSCULAR | 0 refills | Status: DC

## 2021-11-04 MED ORDER — "SYRINGE 22G X 1"" 3 ML MISC": 1.0000 | 0 refills | Status: DC

## 2021-11-14 ENCOUNTER — Encounter: Payer: Self-pay | Admitting: Gastroenterology

## 2021-11-14 ENCOUNTER — Other Ambulatory Visit: Payer: Self-pay

## 2021-11-14 ENCOUNTER — Ambulatory Visit (INDEPENDENT_AMBULATORY_CARE_PROVIDER_SITE_OTHER): Payer: Medicaid Other | Admitting: Gastroenterology

## 2021-11-14 VITALS — BP 126/80 | HR 80 | Ht 63.0 in | Wt 234.0 lb

## 2021-11-14 DIAGNOSIS — R109 Unspecified abdominal pain: Secondary | ICD-10-CM | POA: Diagnosis not present

## 2021-11-14 DIAGNOSIS — R1084 Generalized abdominal pain: Secondary | ICD-10-CM | POA: Diagnosis not present

## 2021-11-14 MED ORDER — DICYCLOMINE HCL 10 MG PO CAPS: 10.0000 mg | ORAL_CAPSULE | Freq: Four times a day (QID) | ORAL | 3 refills | Status: DC | PRN

## 2021-11-14 NOTE — Patient Instructions (Addendum)
If you are age 36 or older, your body mass index should be between 23-30. Your Body mass index is 41.45 kg/m. If this is out of the aforementioned range listed, please consider follow up with your Primary Care Provider.  If you are age 102 or younger, your body mass index should be between 19-25. Your Body mass index is 41.45 kg/m. If this is out of the aformentioned range listed, please consider follow up with your Primary Care Provider.   ________________________________________________________  The [facility] providers would like to encourage you to use [facility] to communicate with providers for non-urgent requests or questions.  Due to long hold times on the telephone, sending your provider a message by [facility] may be a faster and more efficient way to get a response.  Please allow 48 business hours for a response.  Please remember that this is for non-urgent requests.  _______________________________________________________  We have sent the following medications to your pharmacy for you to pick up at your convenience: Bentyl  Please call with any questions or concerns.  Please call in 3 months to make an appointment  Low-FODMAP Eating Plan FODMAP stands for fermentable oligosaccharides, disaccharides, monosaccharides, and polyols. These are sugars that are hard for some people to digest. A low-FODMAP eating plan may help some people who have irritable bowel syndrome (IBS) and certain other bowel (intestinal) diseases to manage their symptoms. This meal plan can be complicated to follow. Work with a diet and nutrition specialist (dietitian) to make a low-FODMAP eating plan that is right for you. A dietitian can help make sure that you get enough nutrition from this diet. What are tips for following this plan? Reading food labels Check labels for hidden FODMAPs such as: High-fructose syrup. Honey. Agave. Natural fruit flavors. Onion or garlic powder. Choose low-FODMAP foods that  contain 3-4 grams of fiber per serving. Check food labels for serving sizes. Eat only one serving at a time to make sure FODMAP levels stay low. Shopping Shop with a list of foods that are recommended on this diet and make a meal plan. Meal planning Follow a low-FODMAP eating plan for up to 6 weeks, or as told by your health care provider or dietitian. To follow the eating plan: Eliminate high-FODMAP foods from your diet completely. Choose only low-FODMAP foods to eat. You will do this for 2-6 weeks. Gradually reintroduce high-FODMAP foods into your diet one at a time. Most people should wait a few days before introducing the next new high-FODMAP food into their meal plan. Your dietitian can recommend how quickly you may reintroduce foods. Keep a daily record of what and how much you eat and drink. Make note of any symptoms that you have after eating. Review your daily record with a dietitian regularly to identify which foods you can eat and which foods you should avoid. General tips Drink enough fluid each day to keep your urine pale yellow. Avoid processed foods. These often have added sugar and may be high in FODMAPs. Avoid most dairy products, whole grains, and sweeteners. Work with a dietitian to make sure you get enough fiber in your diet. Avoid high FODMAP foods at meals to manage symptoms. Recommended foods Fruits Bananas, oranges, tangerines, lemons, limes, blueberries, raspberries, strawberries, grapes, cantaloupe, honeydew melon, kiwi, papaya, passion fruit, and pineapple. Limited amounts of dried cranberries, banana chips, and shredded coconut. Vegetables Eggplant, zucchini, cucumber, peppers, green beans, bean sprouts, lettuce, arugula, kale, Swiss chard, spinach, collard greens, bok choy, summer squash, potato, and  tomato. Limited amounts of corn, carrot, and sweet potato. Green parts of scallions. Grains Gluten-free grains, such as rice, oats, buckwheat, quinoa, corn, polenta,  and millet. Gluten-free pasta, bread, or cereal. Rice noodles. Corn tortillas. Meats and other proteins Unseasoned beef, pork, poultry, or fish. Eggs. Berniece Salines. Tofu (firm) and tempeh. Limited amounts of nuts and seeds, such as almonds, walnuts, [place] nuts, pecans, peanuts, nut butters, pumpkin seeds, chia seeds, and sunflower seeds. Dairy Lactose-free milk, yogurt, and kefir. Lactose-free cottage cheese and ice cream. Non-dairy milks, such as almond, coconut, hemp, and rice milk. Non-dairy yogurt. Limited amounts of goat cheese, brie, mozzarella, parmesan, swiss, and other hard cheeses. Fats and oils Butter-free spreads. Vegetable oils, such as olive, canola, and sunflower oil. Seasoning and other foods Artificial sweeteners with names that do not end in "ol," such as aspartame, saccharine, and stevia. Maple syrup, white table sugar, raw sugar, brown sugar, and molasses. Mayonnaise, soy sauce, and tamari. Fresh basil, coriander, parsley, rosemary, and thyme. Beverages Water and mineral water. Sugar-sweetened soft drinks. Small amounts of orange juice or cranberry juice. Black and green tea. Most dry wines. Coffee. The items listed above may not be a complete list of foods and beverages you can eat. Contact a dietitian for more information. Foods to avoid Fruits Fresh, dried, and juiced forms of apple, pear, watermelon, peach, plum, cherries, apricots, blackberries, boysenberries, figs, nectarines, and mango. Avocado. Vegetables Chicory root, artichoke, asparagus, cabbage, snow peas, Brussels sprouts, broccoli, sugar snap peas, mushrooms, celery, and cauliflower. Onions, garlic, leeks, and the white part of scallions. Grains Wheat, including kamut, durum, and semolina. Barley and bulgur. Couscous. Wheat-based cereals. Wheat noodles, bread, crackers, and pastries. Meats and other proteins Fried or fatty meat. Sausage. Cashews and pistachios. Soybeans, baked beans, black beans, chickpeas, kidney  beans, fava beans, navy beans, lentils, black-eyed peas, and split peas. Dairy Milk, yogurt, ice cream, and soft cheese. Cream and sour cream. Milk-based sauces. Custard. Buttermilk. Soy milk. Seasoning and other foods Any sugar-free gum or candy. Foods that contain artificial sweeteners such as sorbitol, mannitol, isomalt, or xylitol. Foods that contain honey, high-fructose corn syrup, or agave. Bouillon, vegetable stock, beef stock, and chicken stock. Garlic and onion powder. Condiments made with onion, such as hummus, chutney, pickles, relish, salad dressing, and salsa. Tomato paste. Beverages Chicory-based drinks. Coffee substitutes. Chamomile tea. Fennel tea. Sweet or fortified wines such as port or sherry. Diet soft drinks made with isomalt, mannitol, maltitol, sorbitol, or xylitol. Apple, pear, and mango juice. Juices with high-fructose corn syrup. The items listed above may not be a complete list of foods and beverages you should avoid. Contact a dietitian for more information. Summary FODMAP stands for fermentable oligosaccharides, disaccharides, monosaccharides, and polyols. These are sugars that are hard for some people to digest. A low-FODMAP eating plan is a short-term diet that helps to ease symptoms of certain bowel diseases. The eating plan usually lasts up to 6 weeks. After that, high-FODMAP foods are reintroduced gradually and one at a time. This can help you find out which foods may be causing symptoms. A low-FODMAP eating plan can be complicated. It is best to work with a dietitian who has experience with this type of plan. This information is not intended to replace advice given to you by your health care provider. Make sure you discuss any questions you have with your health care provider. Document Revised: 01/22/2020 Document Reviewed: 01/22/2020 Elsevier Patient Education  [place].

## 2021-11-14 NOTE — Progress Notes (Signed)
Chief Complaint:    Abdominal pain  GI History: 36 year old female with a history of chronic constipation since at least 2017 with extensive work-up as outlined below. Has previously trialed Linzess without response. Did not trial Amitiza. Trialed fiber supplement and Dulcolax without response  -CT abdomen/pelvis 12/2015: Questionable thickening of the duodenal wall, lobular uterus with small uterine fibroids, tiny hepatic cyst -EGD (03/2016): Normal -Repeat CT (03/2016): Normal.  Stable incidental hepatic cyst, small uterine fibroids -2017: Normal thyroid function studies -Trial of Linzess 72 mcg with diarrhea after first dose stopped -Trial of low-dose Amitiza -Abdominal x-ray (2017): Normal -Was seen by [facility] (Dr. Prescott Parma) in 05/2017: Diagnosed with IBS-C, recommended MiraLAX -UGI (6/19): Minimal hiatal hernia, spontaneous reflux -Then seen in the [place] [facility] 05/2018 (Dr. Alice Reichert) and treated for IBS with low FODMAP diet, MiraLAX, OTC fiber supplement, physical activity/exercise and labs, with consideration for TCA future -05/2018: Normal CBC, CMP, ESR, CRP, celiac panel -04/2019: Normal TSH, CMP, CBC (WBC 11.6). Vit D 17.1--> started Ergocalciferol - 01/01/2020.  Initial appointment with me in GI clinic.  Recommended colonoscopy, MiraLAX, hydration, and if unrevealing ARM -01/30/2020: Colonoscopy: Normal  HPI:     Patient is a 36 y.o. female presenting to the Gastroenterology Clinic for evaluation of upper abdominal pain for the last week or so.  Described as achy generalized pain.  Same pain as she has had intermittently over the years.  Fortunately, her constipation has abated, and she has titrated off all laxatives.  Having regular BMs without straining.  No longer taking any laxatives, stools softeners, etc. No hematochezia, melena, diarrhea, nausea, vomiting.  Pain can occur a few days/week, lasting on/off throughout the day. Not related to PO intake. Occasional nocturnal sxs. Does  not take any meds, OTC, etc for the sxs.  Otherwise good p.o. intake.  Recent labs as below.  Also with vitamin D deficiency (15.1), and normal B12 and folate.  Was started on ergocalciferol and B12 injections.  Normal TSH.  No recent abdominal imaging for review.  CBC Latest Ref Rng & Units 10/31/2021 02/02/2021 04/09/2020  WBC 3.4 - 10.8 x10E3/uL 8.8 7.8 10.4  Hemoglobin 11.1 - 15.9 g/dL 14.1 13.6 13.3  Hematocrit 34.0 - 46.6 % 42.1 40.4 38.3  Platelets 150 - 450 x10E3/uL 467(H) 388 413(H)    CMP Latest Ref Rng & Units 10/31/2021 02/02/2021 04/30/2019  Glucose 70 - 99 mg/dL 90 106(H) 95  BUN 6 - 20 mg/dL 10 11 13   Creatinine 0.57 - 1.00 mg/dL 0.79 0.79 0.65  Sodium 134 - 144 mmol/L 135 138 136  Potassium 3.5 - 5.2 mmol/L 4.5 3.9 3.9  Chloride 96 - 106 mmol/L 103 106 104  CO2 20 - 29 mmol/L 20 25 24   Calcium 8.7 - 10.2 mg/dL 9.2 8.8(L) 8.6(L)  Total Protein 6.0 - 8.5 g/dL 7.4 7.7 7.4  Total Bilirubin 0.0 - 1.2 mg/dL 0.3 0.6 0.2(L)  Alkaline Phos 44 - 121 IU/L 88 68 76  AST 0 - 40 IU/L 14 16 16   ALT 0 - 32 IU/L 7 7 10     Review of systems:     No chest pain, no SOB, no fevers, no urinary sx   Past Medical History:  Diagnosis Date   Back pain    mid and lower back   Chronic abdominal pain    Chronic constipation    Fibroids    one submucosal and one intramural (both  < 1 inch in size)   Headache  caused by pituitary cyst   IBS (irritable bowel syndrome)    Missed abortion    Motion sickness cars   Obesity    Pituitary cyst ([place])    Rathke's pouch cyst    Patient's surgical history, family medical history, social history, medications and allergies were all reviewed in Epic    Current Outpatient Medications  Medication Sig Dispense Refill   cyanocobalamin (,VITAMIN B-12,) 1000 MCG/ML injection Inject 1 mL (1,000 mcg total) into the muscle every 30 (thirty) days. Per month ( pt needs syringes with needles as well ) self administered 3 mL 0   ergocalciferol (VITAMIN D2)  1.25 MG (50000 UT) capsule Take 1 capsule (50,000 Units total) by mouth once a week. 4 capsule 5   methocarbamol (ROBAXIN) 500 MG tablet Take 1 tablet (500 mg total) by mouth every 8 (eight) hours as needed for muscle spasms. 60 tablet 0   Syringe/Needle, Disp, (SYRINGE 3CC/22GX1") 22G X 1" 3 ML MISC 1 Device by Does not apply route every 30 (thirty) days. 3 each 0   No current facility-administered medications for this visit.    Physical Exam:     BP 126/80    Pulse 80    Ht 5' 3"  (1.6 m)    Wt 234 lb (106.1 kg)    SpO2 99%    BMI 41.45 kg/m   GENERAL:  Pleasant female in NAD CARDIAC:  RRR, no murmur heard, no peripheral edema PULM: Normal respiratory effort, lungs CTA bilaterally, no wheezing ABDOMEN: Mild discomfort in bilateral lower abdomen.  No rebound or guarding.  No peritoneal signs.  Nondistended, soft. No obvious masses, no hepatomegaly,  normal bowel sounds SKIN:  turgor, no lesions seen Musculoskeletal:  Normal muscle tone, normal strength NEURO: Alert and oriented x 3, no focal neurologic deficits   IMPRESSION and PLAN:    1) Abdominal cramping 2) Generalized abdominal  - Trial course of Bentyl - Low FODMAP diet.  Provided with handout and detailed instructions today - Increase daily water intake to least 64 ounces/day - If ongoing symptoms, can plan for either cross-sectional imaging or upper endoscopy  - RTC in 3 months or sooner as needed         Lavena Bullion ,DO, FACG 11/14/2021, 3:32 PM

## 2021-11-18 ENCOUNTER — Encounter: Payer: Self-pay | Admitting: Nurse Practitioner

## 2021-12-01 ENCOUNTER — Ambulatory Visit: Payer: BC Managed Care – PPO

## 2021-12-06 NOTE — Telephone Encounter (Signed)
Please make app

## 2021-12-07 ENCOUNTER — Encounter: Payer: Self-pay | Admitting: Nurse Practitioner

## 2021-12-07 ENCOUNTER — Ambulatory Visit (INDEPENDENT_AMBULATORY_CARE_PROVIDER_SITE_OTHER): Payer: BC Managed Care – PPO | Admitting: Nurse Practitioner

## 2021-12-07 ENCOUNTER — Other Ambulatory Visit: Payer: Self-pay

## 2021-12-07 ENCOUNTER — Other Ambulatory Visit
Admission: RE | Admit: 2021-12-07 | Discharge: 2021-12-07 | Disposition: A | Discharge status: Home / Self Care | Payer: BC Managed Care – PPO | Attending: Nurse Practitioner | Admitting: Nurse Practitioner

## 2021-12-07 VITALS — BP 136/90 | HR 80 | Temp 98.6°F | Resp 16 | Ht 63.0 in | Wt 237.0 lb

## 2021-12-07 DIAGNOSIS — M25511 Pain in right shoulder: Secondary | ICD-10-CM

## 2021-12-07 DIAGNOSIS — O210 Mild hyperemesis gravidarum: Secondary | ICD-10-CM | POA: Insufficient documentation

## 2021-12-07 DIAGNOSIS — L738 Other specified follicular disorders: Secondary | ICD-10-CM

## 2021-12-07 DIAGNOSIS — M778 Other enthesopathies, not elsewhere classified: Secondary | ICD-10-CM | POA: Diagnosis not present

## 2021-12-07 LAB — CBC WITH DIFFERENTIAL/PLATELET
Abs Immature Granulocytes: 0.04 10*3/uL (ref 0.00–0.07)
Basophils Absolute: 0 10*3/uL (ref 0.0–0.1)
Basophils Relative: 0 %
Eosinophils Absolute: 0.1 10*3/uL (ref 0.0–0.5)
Eosinophils Relative: 1 %
HCT: 40.8 % (ref 36.0–46.0)
Hemoglobin: 13.4 g/dL (ref 12.0–15.0)
Immature Granulocytes: 0 %
Lymphocytes Relative: 28 %
Lymphs Abs: 2.7 10*3/uL (ref 0.7–4.0)
MCH: 28.8 pg (ref 26.0–34.0)
MCHC: 32.8 g/dL (ref 30.0–36.0)
MCV: 87.6 fL (ref 80.0–100.0)
Monocytes Absolute: 0.5 10*3/uL (ref 0.1–1.0)
Monocytes Relative: 6 %
Neutro Abs: 6 10*3/uL (ref 1.7–7.7)
Neutrophils Relative %: 65 %
Platelets: 446 10*3/uL — ABNORMAL HIGH (ref 150–400)
RBC: 4.66 MIL/uL (ref 3.87–5.11)
RDW: 13.6 % (ref 11.5–15.5)
WBC: 9.3 10*3/uL (ref 4.0–10.5)
nRBC: 0 % (ref 0.0–0.2)

## 2021-12-07 LAB — HCG, QUANTITATIVE, PREGNANCY: hCG, Beta Chain, Quant, S: <1 m[IU]/mL (ref <5)

## 2021-12-07 MED ORDER — MUPIROCIN 2 % EX OINT: 1.0000 "application " | TOPICAL_OINTMENT | Freq: Two times a day (BID) | CUTANEOUS | 2 refills | Status: DC

## 2021-12-07 MED ORDER — PREDNISONE 10 MG PO TABS: ORAL_TABLET | ORAL | 0 refills | Status: DC

## 2021-12-07 NOTE — Progress Notes (Signed)
[facility] ?[address] ?[place], [place] [address] ? ?Internal MEDICINE  ?Office Visit Note ? ?Patient Name: Shannon Foley ? [id number]  ?[id number] ? ?Date of Service: 12/07/2021 ? ?Chief Complaint  ?Patient presents with  ? Acute Visit  ?  Little bumps on chest and neck and face, the ones on chest are itchy, noticed them last week, right shoulder started hurting monday  ? light headed  ?  Started Monday   ? Headache  ?  On Tuesday    ? ? ? ?HPI ?Shannon Foley presents for an acute sick visit for intermittent lightheadedness, dizziness and nausea over the past couple of days.  She is requesting a serum hCG pregnancy test to make sure she is not pregnant.  Her last menstrual period she reports was technically on March 18 but she only bled for 2 days and it was light.  Her last menstrual period before then was February 21. ?She is also having right shoulder pain and right wrist pain.  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 she denies any decreased range of motion or decreased in strength or having any sort of muscle weakness in her arm.  Has tried taking Motrin but states it did not help.  The pain started a few days ago. ?She also has scattered itchy red bumps that sometimes look like little pimples on her chest neck and lower part of her face. ? ? ?Current Medication: ? ?Outpatient Encounter Medications as of 12/07/2021  ?Medication Sig  ? cyanocobalamin (,VITAMIN B-12,) 1000 MCG/ML injection Inject 1 mL (1,000 mcg total) into the muscle every 30 (thirty) days. Per month ( pt needs syringes with needles as well ) self administered  ? dicyclomine (BENTYL) 10 MG capsule Take 1 capsule (10 mg total) by mouth every 6 (six) hours as needed for spasms.  ? ergocalciferol (VITAMIN D2) 1.25 MG (50000 UT) capsule Take 1 capsule (50,000 Units total) by mouth once a week.  ? methocarbamol  (ROBAXIN) 500 MG tablet Take 1 tablet (500 mg total) by mouth every 8 (eight) hours as needed for muscle spasms.  ? mupirocin ointment (BACTROBAN) 2 % Apply 1 application. topically 2 (two) times daily. To affected area until resolved.  ? predniSONE (DELTASONE) 10 MG tablet Take one tab 3 x day for 3 days, then take one tab 2 x a day for 3 days and then take one tab a day for 3 days for tendinitis  ? Syringe/Needle, Disp, (SYRINGE 3CC/22GX1") 22G X 1" 3 ML MISC 1 Device by Does not apply route every 30 (thirty) days.  ? ?No facility-administered encounter medications on file as of 12/07/2021.  ? ? ? ? ?Medical History: ?Past Medical History:  ?Diagnosis Date  ? Back pain   ? mid and lower back  ? Chronic abdominal pain   ? Chronic constipation   ? Fibroids   ? one submucosal and one intramural (both  < 1 inch in size)  ? Headache   ? caused by pituitary cyst  ? IBS (irritable bowel syndrome)   ? Missed abortion   ? Motion sickness cars  ? Obesity   ? Pituitary cyst ([place])   ? Rathke's pouch cyst  ? ? ? ?Vital Signs: ?BP 136/90   Pulse 80   Temp 98.6 ?F (37 ?C)   Resp 16   Ht '5\' 3"'$  (1.6  m)   Wt 237 lb (107.5 kg)   SpO2 98%   BMI 41.98 kg/m?  ? ? ?Review of Systems  ?Constitutional:  Positive for fatigue. Negative for chills and unexpected weight change.  ?HENT:  Negative for congestion, rhinorrhea, sneezing and sore throat.   ?Eyes:  Negative for redness.  ?Respiratory:  Negative for cough, chest tightness and shortness of breath.   ?Cardiovascular:  Negative for chest pain and palpitations.  ?Gastrointestinal:  Positive for nausea. Negative for abdominal pain, constipation, diarrhea and vomiting.  ?Genitourinary:  Negative for dysuria and frequency.  ?Musculoskeletal:  Negative for arthralgias, back pain, joint swelling and neck pain.  ?Skin:  Positive for rash (chest, neck and face).  ?Neurological:  Positive for dizziness, light-headedness and headaches. Negative for tremors and numbness.  ?Hematological:   Negative for adenopathy. Does not bruise/bleed easily.  ?Psychiatric/Behavioral:  Negative for behavioral problems (Depression), sleep disturbance and suicidal ideas. The patient is not nervous/anxious.   ? ?Physical Exam ?Vitals reviewed.  ?Constitutional:   ?   General: She is not in acute distress. ?   Appearance: Normal appearance. She is obese. She is not ill-appearing.  ?HENT:  ?   Head: Normocephalic and atraumatic.  ?Eyes:  ?   Pupils: Pupils are equal, round, and reactive to light.  ?Cardiovascular:  ?   Rate and Rhythm: Normal rate and regular rhythm.  ?Pulmonary:  ?   Effort: Pulmonary effort is normal. No respiratory distress.  ?Musculoskeletal:  ?   Right wrist: Tenderness present. No swelling. Decreased range of motion.  ?   Left wrist: Normal.  ?Skin: ?   Findings: Rash present. Rash is papular (sometimes itchy).  ?Neurological:  ?   Mental Status: She is alert and oriented to person, place, and time.  ?Psychiatric:     ?   Mood and Affect: Mood normal.     ?   Behavior: Behavior normal.  ? ? ? ? ?Assessment/Plan: ?1. Bacterial folliculitis ?Instructed patient to clean area of rash with benzoyl peroxide and may apply mupircin twice daily until rash is resolved ?- mupirocin ointment (BACTROBAN) 2 %; Apply 1 application. topically 2 (two) times daily. To affected area until resolved.  Dispense: 30 g; Refill: 2 ? ?2. Tendinitis of right wrist ?Prednisone taper prescribed. Instructed patient to rest her wrist, may apply ice/heat if desired. Stretches provided to patient that may help improve pain and strengthen her wrist.  ?- predniSONE (DELTASONE) 10 MG tablet; Take one tab 3 x day for 3 days, then take one tab 2 x a day for 3 days and then take one tab a day for 3 days for tendinitis  Dispense: 18 tablet; Refill: 0 ? ?3. Acute pain of right shoulder ?Prednisone taper prescribed to help decrease inflammation, instructed to rest, may apply ice/heat if desired, shoulder exercises provided to patient.  ?-  predniSONE (DELTASONE) 10 MG tablet; Take one tab 3 x day for 3 days, then take one tab 2 x a day for 3 days and then take one tab a day for 3 days for tendinitis  Dispense: 18 tablet; Refill: 0 ? ?4. Morning sickness ?Labs ordered, patient wants to be tested for pregnancy ?- CBC with Differential/Platelet ?- B-HCG Quant ?- Beta HCG, Quant ? ? ?General Counseling: Elizbeth verbalizes understanding of the findings of todays visit and agrees with plan of treatment. I have discussed any further diagnostic evaluation that may be needed or ordered today. We also reviewed her medications today. she has been  encouraged to call the office with any questions or concerns that should arise related to todays visit. ? ? ? ?Counseling: ? ? ? ?Orders Placed This Encounter  ?Procedures  ? CBC with Differential/Platelet  ? B-HCG Quant  ? Beta HCG, Quant  ? ? ?Meds ordered this encounter  ?Medications  ? mupirocin ointment (BACTROBAN) 2 %  ?  Sig: Apply 1 application. topically 2 (two) times daily. To affected area until resolved.  ?  Dispense:  30 g  ?  Refill:  2  ? predniSONE (DELTASONE) 10 MG tablet  ?  Sig: Take one tab 3 x day for 3 days, then take one tab 2 x a day for 3 days and then take one tab a day for 3 days for tendinitis  ?  Dispense:  18 tablet  ?  Refill:  0  ? ? ?Return if symptoms worsen or fail to improve, for please give pt doc note for work. ? ?[place] Controlled Substance Database was reviewed by me for overdose risk score (ORS) ? ?Time spent:30 Minutes ?Time spent with patient included reviewing progress notes, labs, imaging studies, and discussing plan for follow up.  ? ?This patient was seen by Jonetta Osgood, FNP-C in collaboration with Dr. Clayborn Bigness as a part of collaborative care agreement. ? ?Tawnia Schirm R. Valetta Fuller, MSN, FNP-C ?Internal Medicine ?

## 2021-12-08 ENCOUNTER — Encounter: Payer: Self-pay | Admitting: Nurse Practitioner

## 2021-12-26 ENCOUNTER — Encounter: Payer: Self-pay | Admitting: Nurse Practitioner

## 2021-12-26 DIAGNOSIS — H65196 Other acute nonsuppurative otitis media, recurrent, bilateral: Secondary | ICD-10-CM

## 2021-12-26 DIAGNOSIS — L309 Dermatitis, unspecified: Secondary | ICD-10-CM

## 2021-12-26 DIAGNOSIS — J0141 Acute recurrent pansinusitis: Secondary | ICD-10-CM

## 2021-12-27 ENCOUNTER — Telehealth: Payer: Self-pay

## 2021-12-27 MED ORDER — TRIAMCINOLONE ACETONIDE 0.1 % EX CREA
1.0000 | TOPICAL_CREAM | Freq: Two times a day (BID) | CUTANEOUS | 0 refills | Status: DC
Start: 2021-12-27 — End: 2022-05-26

## 2021-12-28 NOTE — Telephone Encounter (Signed)
Error

## 2021-12-30 NOTE — Telephone Encounter (Signed)
Spoke to pt, she received meds and started them yesterday. Pt will call if there are any issues.  ?

## 2022-01-05 ENCOUNTER — Ambulatory Visit: Payer: BC Managed Care – PPO

## 2022-01-05 NOTE — Telephone Encounter (Signed)
Can someone please assist patient with this question? Does she need appointment or can she just be referred to dermatologist? ?

## 2022-01-11 ENCOUNTER — Telehealth: Payer: Self-pay

## 2022-01-11 NOTE — Telephone Encounter (Signed)
Dermatology referral sent via Epic to [facility] ?

## 2022-01-11 NOTE — Telephone Encounter (Signed)
Otolaryngology referral sent via [facility] to [facility] 

## 2022-01-27 ENCOUNTER — Encounter: Payer: Self-pay | Admitting: Nurse Practitioner

## 2022-01-31 ENCOUNTER — Ambulatory Visit: Payer: BC Managed Care – PPO | Admitting: Dermatology

## 2022-02-20 ENCOUNTER — Telehealth: Payer: Self-pay

## 2022-02-20 NOTE — Telephone Encounter (Signed)
Patient had refused dermatology referral to [facility]. Per her request, referral faxed to [facility]

## 2022-03-01 NOTE — Telephone Encounter (Signed)
Per Doren Custard, referral denied. Patient did not return calls to schedule[facility]

## 2022-03-14 ENCOUNTER — Ambulatory Visit: Payer: BC Managed Care – PPO | Admitting: Nurse Practitioner

## 2022-04-17 ENCOUNTER — Ambulatory Visit (INDEPENDENT_AMBULATORY_CARE_PROVIDER_SITE_OTHER): Payer: BC Managed Care – PPO | Admitting: Physician Assistant

## 2022-04-17 ENCOUNTER — Encounter: Payer: Self-pay | Admitting: Physician Assistant

## 2022-04-17 ENCOUNTER — Encounter: Payer: Self-pay | Admitting: Nurse Practitioner

## 2022-04-17 ENCOUNTER — Other Ambulatory Visit: Payer: Self-pay

## 2022-04-17 ENCOUNTER — Ambulatory Visit: Payer: BC Managed Care – PPO | Admitting: Nurse Practitioner

## 2022-04-17 VITALS — BP 128/80 | HR 78 | Temp 98.0°F | Resp 16 | Ht 63.0 in | Wt 236.0 lb

## 2022-04-17 DIAGNOSIS — M25471 Effusion, right ankle: Secondary | ICD-10-CM | POA: Diagnosis not present

## 2022-04-17 DIAGNOSIS — M722 Plantar fascial fibromatosis: Secondary | ICD-10-CM | POA: Diagnosis not present

## 2022-04-17 DIAGNOSIS — E538 Deficiency of other specified B group vitamins: Secondary | ICD-10-CM

## 2022-04-17 DIAGNOSIS — R200 Anesthesia of skin: Secondary | ICD-10-CM | POA: Diagnosis not present

## 2022-04-17 DIAGNOSIS — M25472 Effusion, left ankle: Secondary | ICD-10-CM

## 2022-04-17 DIAGNOSIS — R202 Paresthesia of skin: Secondary | ICD-10-CM

## 2022-04-17 DIAGNOSIS — M25475 Effusion, left foot: Secondary | ICD-10-CM

## 2022-04-17 DIAGNOSIS — M25474 Effusion, right foot: Secondary | ICD-10-CM

## 2022-04-17 MED ORDER — CYANOCOBALAMIN 1000 MCG/ML IJ SOLN
INTRAMUSCULAR | 4 refills | Status: DC
Start: 2022-04-17 — End: 2022-12-01

## 2022-04-17 MED ORDER — "SYRINGE 22G X 1"" 3 ML MISC": 1.0000 | 3 refills | Status: DC

## 2022-04-17 MED ORDER — CYANOCOBALAMIN 1000 MCG/ML IJ SOLN
1000.0000 ug | Freq: Once | INTRAMUSCULAR | Status: AC
  Administered 2022-04-17: 1000 ug via INTRAMUSCULAR

## 2022-04-17 NOTE — Progress Notes (Unsigned)
[facility] [place], [place] [address]  Internal MEDICINE  Office Visit Note  Patient Name: Shannon Foley  [id number]  [id number]  Date of Service: 04/18/2022  Chief Complaint  Patient presents with   Acute Visit    Both feet swelling and numbness going for 2 weeks and numbness in both hands   Abdominal Pain    Few months and pt see GI      HPI Pt is here for a sick visit. -Swelling in feet during the day and some numbness. Swelling goes down but numbness stays. Numbness in hands as well.  -Does not wear compression stockings and stands all day for work. -Hx bilateral plantar fasciitis and previously had injections, but not in a long time. -Discussed utilizing compression stockings and rolling foot on ice water bottle to help with plantar fasciitis as well as ibuprofen as needed. May need to follow up with podiatry if worsening. Elevate legs when possible. -No SOB -Did have low B12 in feb and was advised to start B12 injections however did not do this and may be contributing to numbness. Will move forward with this now. -Does also mention some abdominal pain at times for the past months and does have hx of uterine fibroids as well as IBS. Had visit with GI a few months ago with trial of bentyl and was supposed to have follow up for further evaluation if not improved, but has not scheduled this yet and is going to now.  Current Medication:  Outpatient Encounter Medications as of 04/17/2022  Medication Sig   dicyclomine (BENTYL) 10 MG capsule Take 1 capsule (10 mg total) by mouth every 6 (six) hours as needed for spasms.   ergocalciferol (VITAMIN D2) 1.25 MG (50000 UT) capsule Take 1 capsule (50,000 Units total) by mouth once a week.   methocarbamol (ROBAXIN) 500 MG tablet Take 1 tablet (500 mg total) by mouth every 8 (eight) hours as needed for muscle spasms.   mupirocin ointment (BACTROBAN) 2 % Apply 1 application. topically 2 (two) times daily. To  affected area until resolved.   triamcinolone cream (KENALOG) 0.1 % Apply 1 application. topically 2 (two) times daily. To affected area.   [DISCONTINUED] cyanocobalamin (,VITAMIN B-12,) 1000 MCG/ML injection Inject 1 mL (1,000 mcg total) into the muscle every 30 (thirty) days. Per month ( pt needs syringes with needles as well ) self administered   [DISCONTINUED] predniSONE (DELTASONE) 10 MG tablet Take one tab 3 x day for 3 days, then take one tab 2 x a day for 3 days and then take one tab a day for 3 days for tendinitis   [DISCONTINUED] Syringe/Needle, Disp, (SYRINGE 3CC/22GX1") 22G X 1" 3 ML MISC 1 Device by Does not apply route every 30 (thirty) days.   [EXPIRED] cyanocobalamin (VITAMIN B12) injection 1,000 mcg    No facility-administered encounter medications on file as of 04/17/2022.      Medical History: Past Medical History:  Diagnosis Date   Back pain    mid and lower back   Chronic abdominal pain    Chronic constipation    Fibroids    one submucosal and one intramural (both  < 1 inch in size)   Headache    caused by pituitary cyst   IBS (irritable bowel syndrome)    Missed abortion    Motion sickness cars   Obesity    Pituitary cyst (Shannon Foley)    Rathke's pouch cyst     Vital Signs: BP  128/80   Pulse 78   Temp 98 F (36.7 C)   Resp 16   Ht '5\' 3"'$  (1.6 m)   Wt 236 lb (107 kg)   SpO2 99%   BMI 41.81 kg/m    Review of Systems  Constitutional:  Negative for fatigue and fever.  HENT:  Negative for congestion, mouth sores and postnasal drip.   Respiratory:  Negative for cough.   Cardiovascular:  Negative for chest pain.  Genitourinary:  Negative for flank pain.  Musculoskeletal:  Positive for arthralgias.       Feet swelling  Neurological:  Positive for numbness.  Psychiatric/Behavioral: Negative.      Physical Exam Vitals and nursing note reviewed.  Constitutional:      General: She is not in acute distress.    Appearance: Normal appearance. She is  well-developed. She is obese. She is not diaphoretic.  HENT:     Head: Normocephalic and atraumatic.     Mouth/Throat:     Pharynx: No oropharyngeal exudate.  Eyes:     Pupils: Pupils are equal, round, and reactive to light.  Neck:     Thyroid: No thyromegaly.     Vascular: No JVD.     Trachea: No tracheal deviation.  Cardiovascular:     Rate and Rhythm: Normal rate and regular rhythm.     Heart sounds: Normal heart sounds. No murmur heard.    No friction rub. No gallop.  Pulmonary:     Effort: Pulmonary effort is normal. No respiratory distress.     Breath sounds: No wheezing or rales.  Chest:     Chest wall: No tenderness.  Abdominal:     General: Bowel sounds are normal.     Palpations: Abdomen is soft.  Musculoskeletal:        General: Swelling present. Normal range of motion.     Cervical back: Normal range of motion and neck supple.     Comments: Min swelling at bilateral ankles, some tenderness on bottom of feet  Lymphadenopathy:     Cervical: No cervical adenopathy.  Skin:    General: Skin is warm and dry.     Findings: No erythema.  Neurological:     Mental Status: She is alert and oriented to person, place, and time.     Cranial Nerves: No cranial nerve deficit.  Psychiatric:        Behavior: Behavior normal.        Thought Content: Thought content normal.        Judgment: Judgment normal.       Assessment/Plan: 1. Vitamin B 12 deficiency Will restart on B12 injections - cyanocobalamin (VITAMIN B12) injection 1,000 mcg  2. Numbness and tingling of upper and lower extremities of both sides Likely due to low b12 and will start injections again  3. Bilateral swelling of feet and ankles Likely due to prolonged standing and hx of plantar fasciitis. Will start wearing compression stockings and icing with frozen water bottle under feet when able. Elevate feet when able and wear supportive shoes.  4. Plantar fasciitis Will try ibuprofen as needed and try  icing with frozen water bottle. Wear supportive shoes. May need podiatry follow up if worsening.   General Counseling: [name] verbalizes understanding of the findings of todays visit and agrees with plan of treatment. I have discussed any further diagnostic evaluation that may be needed or ordered today. We also reviewed her medications today. she has been encouraged to call the office with any  questions or concerns that should arise related to todays visit.    Counseling:    No orders of the defined types were placed in this encounter.   Meds ordered this encounter  Medications   cyanocobalamin (VITAMIN B12) injection 1,000 mcg    Time spent:30 Minutes

## 2022-04-18 NOTE — Patient Instructions (Signed)
Plantar Fasciitis  Plantar fasciitis is a painful foot condition that affects the heel. It occurs when the band of tissue that connects the toes to the heel bone (plantar fascia) becomes irritated. This can happen as the result of exercising too much or doing other repetitive activities (overuse injury). Plantar fasciitis can cause mild irritation to severe pain that makes it difficult to walk or move. The pain is usually worse in the morning after sleeping, or after sitting or lying down for a period of time. Pain may also be worse after long periods of walking or standing. What are the causes? This condition may be caused by: Standing for long periods of time. Wearing shoes that do not have good arch support. Doing activities that put stress on joints (high-impact activities). This includes ballet and exercise that makes your heart beat faster (aerobic exercise), such as running. Being overweight. An abnormal way of walking (gait). Tight muscles in the back of your lower leg (calf). High arches in your feet or flat feet. Starting a new athletic activity. What are the signs or symptoms? The main symptom of this condition is heel pain. Pain may get worse after the following: Taking the first steps after a time of rest, especially in the morning after awakening, or after you have been sitting or lying down for a while. Long periods of standing still. Pain may decrease after 30-45 minutes of activity, such as gentle walking. How is this diagnosed? This condition may be diagnosed based on your medical history, a physical exam, and your symptoms. Your health care provider will check for: A tender area on the bottom of your foot. A high arch in your foot or flat feet. Pain when you move your foot. Difficulty moving your foot. You may have imaging tests to confirm the diagnosis, such as: X-rays. Ultrasound. MRI. How is this treated? Treatment for plantar fasciitis depends on how severe your  condition is. Treatment may include: Rest, ice, pressure (compression), and raising (elevating) the affected foot. This is called RICE therapy. Your health care provider may recommend RICE therapy along with over-the-counter pain medicines to manage your pain. Exercises to stretch your calves and your plantar fascia. A splint that holds your foot in a stretched, upward position while you sleep (night splint). Physical therapy to relieve symptoms and prevent problems in the future. Injections of steroid medicine (cortisone) to relieve pain and inflammation. Stimulating your plantar fascia with electrical impulses (extracorporeal shock wave therapy). This is usually the last treatment option before surgery. Surgery, if other treatments have not worked after 12 months. Follow these instructions at home: Managing pain, stiffness, and swelling  If directed, put ice on the painful area. To do this: Put ice in a plastic bag, or use a frozen bottle of water. Place a towel between your skin and the bag or bottle. Roll the bottom of your foot over the bag or bottle. Do this for 20 minutes, 2-3 times a day. Wear athletic shoes that have air-sole or gel-sole cushions, or try soft shoe inserts that are designed for plantar fasciitis. Elevate your foot above the level of your heart while you are sitting or lying down. Activity Avoid activities that cause pain. Ask your health care provider what activities are safe for you. Do physical therapy exercises and stretches as told by your health care provider. Try activities and forms of exercise that are easier on your joints (low impact). Examples include swimming, water aerobics, and biking. General instructions Take over-the-counter   and prescription medicines only as told by your health care provider. Wear a night splint while sleeping, if told by your health care provider. Loosen the splint if your toes tingle, become numb, or turn cold and blue. Maintain a  healthy weight, or work with your health care provider to lose weight as needed. Keep all follow-up visits. This is important. Contact a health care provider if you have: Symptoms that do not go away with home treatment. Pain that gets worse. Pain that affects your ability to move or do daily activities. Summary Plantar fasciitis is a painful foot condition that affects the heel. It occurs when the band of tissue that connects the toes to the heel bone (plantar fascia) becomes irritated. Heel pain is the main symptom of this condition. It may get worse after exercising too much or standing still for a long time. Treatment varies, but it usually starts with rest, ice, pressure (compression), and raising (elevating) the affected foot. This is called RICE therapy. Over-the-counter medicines can also be used to manage pain. This information is not intended to replace advice given to you by your health care provider. Make sure you discuss any questions you have with your health care provider. Document Revised: 12/22/2019 Document Reviewed: 12/22/2019 Elsevier Patient Education  2023 Elsevier Inc.  

## 2022-04-19 ENCOUNTER — Other Ambulatory Visit: Payer: Self-pay | Admitting: Nurse Practitioner

## 2022-04-20 ENCOUNTER — Ambulatory Visit: Payer: BC Managed Care – PPO | Admitting: Nurse Practitioner

## 2022-04-23 ENCOUNTER — Encounter: Payer: Self-pay | Admitting: Physician Assistant

## 2022-04-24 NOTE — Telephone Encounter (Signed)
Please see this

## 2022-04-25 ENCOUNTER — Telehealth: Payer: Self-pay

## 2022-04-25 NOTE — Telephone Encounter (Signed)
Lvm to schedule f/u with Lauren-Toni

## 2022-04-28 ENCOUNTER — Encounter: Payer: Self-pay | Admitting: Nurse Practitioner

## 2022-04-28 ENCOUNTER — Ambulatory Visit: Payer: BC Managed Care – PPO | Admitting: Nurse Practitioner

## 2022-04-28 ENCOUNTER — Ambulatory Visit (INDEPENDENT_AMBULATORY_CARE_PROVIDER_SITE_OTHER): Payer: BC Managed Care – PPO | Admitting: Nurse Practitioner

## 2022-04-28 VITALS — BP 129/81 | HR 77 | Temp 98.2°F | Resp 16 | Ht 63.0 in | Wt 238.2 lb

## 2022-04-28 DIAGNOSIS — M25472 Effusion, left ankle: Secondary | ICD-10-CM

## 2022-04-28 DIAGNOSIS — M722 Plantar fascial fibromatosis: Secondary | ICD-10-CM

## 2022-04-28 DIAGNOSIS — R109 Unspecified abdominal pain: Secondary | ICD-10-CM | POA: Diagnosis not present

## 2022-04-28 DIAGNOSIS — M25471 Effusion, right ankle: Secondary | ICD-10-CM | POA: Diagnosis not present

## 2022-04-28 DIAGNOSIS — R140 Abdominal distension (gaseous): Secondary | ICD-10-CM | POA: Diagnosis not present

## 2022-04-28 DIAGNOSIS — M25474 Effusion, right foot: Secondary | ICD-10-CM | POA: Diagnosis not present

## 2022-04-28 DIAGNOSIS — M25475 Effusion, left foot: Secondary | ICD-10-CM

## 2022-04-28 LAB — POCT PREGNANCY, URINE

## 2022-04-28 MED ORDER — DICYCLOMINE HCL 10 MG PO CAPS: 10.0000 mg | ORAL_CAPSULE | Freq: Four times a day (QID) | ORAL | 3 refills | Status: DC | PRN

## 2022-04-28 NOTE — Progress Notes (Signed)
[facility] [place], [place] [address]  Internal MEDICINE  Office Visit Note  Patient Name: Shannon Foley  [id number]  [id number]  Date of Service: 04/28/2022  Chief Complaint  Patient presents with   Follow-up   Headache   GI Problem    Having Gi issues - possible pregnancy, pt requesting testing - lower back pain, movement in stomach    HPI Shannon Foley presents for a follow up visit for headache and GI issues.  Reports possible pregnancy and is requesting urine pregnancy test, states that she has lower back pain and feels movement in her stomach. --In office urine pregnancy test was negative -- Confirms abdominal distention.  Denies change in bowel pattern, nausea, vomiting, bloating, cramping, change in appetite, or urinary tract symptoms. -- States that the movement that she feels in her stomach feels like a baby kicking but not true abdominal pain. -- Feet are also bothering her, reports swelling in her lower extremities, sharp pain in her heel that radiates down the arch of her foot bilaterally   Current Medication: Outpatient Encounter Medications as of 04/28/2022  Medication Sig   cyanocobalamin (VITAMIN B12) 1000 MCG/ML injection Inject once a week for 2 weeks and then 1 Per month ( pt needs syringes with needles as well ) self administered   methocarbamol (ROBAXIN) 500 MG tablet Take 1 tablet (500 mg total) by mouth every 8 (eight) hours as needed for muscle spasms.   mupirocin ointment (BACTROBAN) 2 % Apply 1 application. topically 2 (two) times daily. To affected area until resolved.   Syringe/Needle, Disp, (SYRINGE 3CC/22GX1") 22G X 1" 3 ML MISC 1 Device by Does not apply route every 30 (thirty) days.   triamcinolone cream (KENALOG) 0.1 % Apply 1 application. topically 2 (two) times daily. To affected area.   Vitamin D, Ergocalciferol, (DRISDOL) 1.25 MG (50000 UNIT) CAPS capsule TAKE 1 CAPSULE BY MOUTH 1 TIME A WEEK   [DISCONTINUED] dicyclomine  (BENTYL) 10 MG capsule Take 1 capsule (10 mg total) by mouth every 6 (six) hours as needed for spasms.   dicyclomine (BENTYL) 10 MG capsule Take 1 capsule (10 mg total) by mouth every 6 (six) hours as needed for spasms.   No facility-administered encounter medications on file as of 04/28/2022.    Surgical History: Past Surgical History:  Procedure Laterality Date   COLONOSCOPY     CYSTOSCOPY  05/10/2016   WNL   DILATION AND EVACUATION N/A 04/13/2020   Procedure: SUCTION DILATION AND CURETTAGE;  Surgeon: [name], MD;  Location: [facility];  Service: Gynecology;  Laterality: N/A;   ESOPHAGOGASTRODUODENOSCOPY (EGD) WITH PROPOFOL N/A 02/18/2016   Procedure: ESOPHAGOGASTRODUODENOSCOPY (EGD) WITH PROPOFOL;  Surgeon: Lucilla Lame, MD;  Location: [place];  Service: Endoscopy;  Laterality: N/A;   TONSILLECTOMY  2017   TONSILLECTOMY      Medical History: Past Medical History:  Diagnosis Date   Back pain    mid and lower back   Chronic abdominal pain    Chronic constipation    Fibroids    one submucosal and one intramural (both  < 1 inch in size)   Headache    caused by pituitary cyst   IBS (irritable bowel syndrome)    Missed abortion    Motion sickness cars   Obesity    Pituitary cyst ([place])    Rathke's pouch cyst    Family History: Family History  Problem Relation Age of Onset   Diabetes Mother    Hypertension  Mother    Congestive Heart Failure Mother    CVA Father        Had meningioma   Diabetes Father    Hypertension Father    Bladder Cancer Paternal Aunt    Colon cancer Neg Hx    Esophageal cancer Neg Hx    Stomach cancer Neg Hx    Rectal cancer Neg Hx     Social History   Socioeconomic History   Marital status: Single    Spouse name: Not on file   Number of children: 0   Years of education: 14   Highest education level: Not on file  Occupational History    Comment: [employer]  Tobacco Use   Smoking status: Never   Smokeless tobacco:  Never  Vaping Use   Vaping Use: Never used  Substance and Sexual Activity   Alcohol use: No   Drug use: No   Sexual activity: Yes    Birth control/protection: None  Other Topics Concern   Not on file  Social History Narrative   Not on file   Social Determinants of Health   Financial Resource Strain: Not on file  Food Insecurity: Not on file  Transportation Needs: Not on file  Physical Activity: Not on file  Stress: Not on file  Social Connections: Not on file  Intimate Partner Violence: Not on file      Review of Systems  Constitutional:  Negative for chills, fatigue and unexpected weight change.  HENT:  Negative for congestion, rhinorrhea, sneezing and sore throat.   Eyes:  Negative for redness.  Respiratory: Negative.  Negative for cough, chest tightness, shortness of breath and wheezing.   Cardiovascular:  Positive for leg swelling. Negative for chest pain and palpitations.  Gastrointestinal:  Positive for abdominal distention and abdominal pain. Negative for constipation, diarrhea, nausea and vomiting.       Feels like a baby kicking in her abdomen  Genitourinary: Negative.  Negative for dysuria and frequency.  Musculoskeletal:  Negative for arthralgias, back pain, joint swelling and neck pain.  Skin:  Negative for rash.  Neurological: Negative.  Negative for tremors and numbness.  Hematological:  Negative for adenopathy. Does not bruise/bleed easily.  Psychiatric/Behavioral:  Negative for behavioral problems (Depression), sleep disturbance and suicidal ideas. The patient is not nervous/anxious.     Vital Signs: BP 129/81   Pulse 77   Temp 98.2 F (36.8 C)   Resp 16   Ht '5\' 3"'$  (1.6 m)   Wt 238 lb 3.2 oz (108 kg)   LMP 04/07/2022 (Exact Date)   SpO2 98%   BMI 42.20 kg/m    Physical Exam Vitals reviewed.  Constitutional:      General: She is not in acute distress.    Appearance: Normal appearance. She is obese. She is not ill-appearing.  HENT:     Head:  Normocephalic and atraumatic.  Cardiovascular:     Rate and Rhythm: Normal rate and regular rhythm.  Pulmonary:     Effort: Pulmonary effort is normal. No respiratory distress.  Abdominal:     General: There is distension.  Musculoskeletal:     Right lower leg: 1+ Edema present.     Left lower leg: 1+ Edema present.  Neurological:     Mental Status: She is alert and oriented to person, place, and time.  Psychiatric:        Mood and Affect: Mood normal.        Behavior: Behavior normal.  Assessment/Plan: 1. Abdominal pain, unspecified abdominal location Negative urine pregnancy test, abdominal ultrasound ordered to further evaluate abdominal pain and distention Dicyclomine prescribed to help alleviate abdominal pain and distention, will discuss effectiveness of medication with patient at her follow-up visit  - POCT Pregnancy, Urine - US Abdomen Complete; Future  2. Abdominal distention See problem #1  - US Abdomen Complete; Future  3. Plantar fasciitis podiatry referral ordered as requested by patient - Ambulatory referral to Podiatry  4. Bilateral swelling of feet and ankles Dependent edema, fluid retention in lower extremities, typically happens when she has been standing all day or sitting for long period of time.  Swelling is minimal.  If worsens, may consider diuretic.  Make sure to elevate lower extremities at the end of the day and when possible throughout the day   General Counseling: Georgetta verbalizes understanding of the findings of todays visit and agrees with plan of treatment. I have discussed any further diagnostic evaluation that may be needed or ordered today. We also reviewed her medications today. she has been encouraged to call the office with any questions or concerns that should arise related to todays visit.    Orders Placed This Encounter  Procedures   US Abdomen Complete   Ambulatory referral to Podiatry    Meds ordered this encounter   Medications   dicyclomine (BENTYL) 10 MG capsule    Sig: Take 1 capsule (10 mg total) by mouth every 6 (six) hours as needed for spasms.    Dispense:  30 capsule    Refill:  3    Return for F/U, U/S @ [facility] PCP.   Total time spent:30 Minutes Time spent includes review of chart, medications, test results, and follow up plan with the patient.    Controlled Substance Database was reviewed by me.  This patient was seen by Jonetta Osgood, FNP-C in collaboration with Dr. Clayborn Bigness as a part of collaborative care agreement.   Loni Abdon R. Valetta Fuller, MSN, FNP-C Internal medicine

## 2022-05-01 ENCOUNTER — Emergency Department
Admission: EM | Admit: 2022-05-01 | Discharge: 2022-05-01 | Disposition: A | Discharge status: Home / Self Care | Payer: BC Managed Care – PPO | Attending: Emergency Medicine | Admitting: Emergency Medicine

## 2022-05-01 ENCOUNTER — Telehealth: Payer: Self-pay

## 2022-05-01 ENCOUNTER — Other Ambulatory Visit: Payer: BC Managed Care – PPO

## 2022-05-01 ENCOUNTER — Emergency Department: Payer: BC Managed Care – PPO

## 2022-05-01 ENCOUNTER — Other Ambulatory Visit: Payer: Self-pay

## 2022-05-01 DIAGNOSIS — K5900 Constipation, unspecified: Secondary | ICD-10-CM | POA: Diagnosis not present

## 2022-05-01 DIAGNOSIS — K2900 Acute gastritis without bleeding: Secondary | ICD-10-CM | POA: Insufficient documentation

## 2022-05-01 DIAGNOSIS — R1012 Left upper quadrant pain: Secondary | ICD-10-CM

## 2022-05-01 DIAGNOSIS — K760 Fatty (change of) liver, not elsewhere classified: Secondary | ICD-10-CM | POA: Diagnosis not present

## 2022-05-01 DIAGNOSIS — R1011 Right upper quadrant pain: Secondary | ICD-10-CM | POA: Diagnosis not present

## 2022-05-01 DIAGNOSIS — R112 Nausea with vomiting, unspecified: Secondary | ICD-10-CM | POA: Diagnosis not present

## 2022-05-01 LAB — COMPREHENSIVE METABOLIC PANEL WITH GFR
ALT: 9 U/L (ref 0–44)
AST: 16 U/L (ref 15–41)
Albumin: 3.8 g/dL (ref 3.5–5.0)
Alkaline Phosphatase: 69 U/L (ref 38–126)
Anion gap: 5 (ref 5–15)
BUN: 12 mg/dL (ref 6–20)
CO2: 23 mmol/L (ref 22–32)
Calcium: 8.7 mg/dL — ABNORMAL LOW (ref 8.9–10.3)
Chloride: 108 mmol/L (ref 98–111)
Creatinine, Ser: 0.76 mg/dL (ref 0.44–1.00)
GFR, Estimated: >60 mL/min (ref >60)
Glucose, Bld: 99 mg/dL (ref 70–99)
Potassium: 3.7 mmol/L (ref 3.5–5.1)
Sodium: 136 mmol/L (ref 135–145)
Total Bilirubin: 0.7 mg/dL (ref 0.3–1.2)
Total Protein: 7.7 g/dL (ref 6.5–8.1)

## 2022-05-01 LAB — URINALYSIS, ROUTINE W REFLEX MICROSCOPIC
Bacteria, UA: NONE SEEN
Bilirubin Urine: NEGATIVE
Glucose, UA: NEGATIVE mg/dL
Hgb urine dipstick: NEGATIVE
Ketones, ur: NEGATIVE mg/dL
Nitrite: NEGATIVE
Protein, ur: NEGATIVE mg/dL
Specific Gravity, Urine: 1.025 (ref 1.005–1.030)
pH: 5 (ref 5.0–8.0)

## 2022-05-01 LAB — CBC
HCT: 41.6 % (ref 36.0–46.0)
Hemoglobin: 13.7 g/dL (ref 12.0–15.0)
MCH: 29 pg (ref 26.0–34.0)
MCHC: 32.9 g/dL (ref 30.0–36.0)
MCV: 87.9 fL (ref 80.0–100.0)
Platelets: 409 10*3/uL — ABNORMAL HIGH (ref 150–400)
RBC: 4.73 MIL/uL (ref 3.87–5.11)
RDW: 13.7 % (ref 11.5–15.5)
WBC: 9.7 10*3/uL (ref 4.0–10.5)
nRBC: 0 % (ref 0.0–0.2)

## 2022-05-01 LAB — LIPASE, BLOOD: Lipase: 30 U/L (ref 11–51)

## 2022-05-01 LAB — PREGNANCY, URINE: Preg Test, Ur: NEGATIVE

## 2022-05-01 MED ORDER — OMEPRAZOLE MAGNESIUM 20 MG PO TBEC
20.0000 mg | DELAYED_RELEASE_TABLET | Freq: Every day | ORAL | 1 refills | Status: DC
Start: 2022-05-01 — End: 2022-06-20

## 2022-05-01 NOTE — ED Triage Notes (Signed)
Pt to ED via POV from home. Pt reports abdominal pain that started yesterday. Pt reports nausea, vomiting and some constipation.

## 2022-05-01 NOTE — ED Provider Notes (Signed)
-----------------------------------------   11:55 AM on 05/01/2022 -----------------------------------------  Blood pressure (!) 148/80, pulse 78, temperature 98.6 F (37 C), temperature source Oral, resp. rate 16, height '5\' 3"'$  (1.6 m), weight 104.3 kg, last menstrual period 04/07/2022, SpO2 100 %.  Assuming care from [facility].  In short, Shannon Foley is a 36 y.o. female with a chief complaint of Abdominal Pain .  Refer to the original H&P for additional details.  The current plan of care is to follow-up pregnancy testing and reassess.  ----------------------------------------- 2:29 PM on 05/01/2022 ----------------------------------------- Pregnancy testing is negative, on reassessment patient is primarily complaining of pain in her epigastrium and left upper quadrant.  She states that she had been scheduled for right upper quadrant ultrasound later today by her PCP and is requesting this be performed here in the ED.  Upper quadrant ultrasound performed and unremarkable with no evidence of biliary pathology.  Given reassuring work-up, patient is appropriate for discharge home with PCP follow-up.  Suspect her symptoms are secondary to gastritis and we will start her on omeprazole.  She was counseled to return to the ED for new or worsening symptoms, patient agrees with plan.    Blake Divine, MD 05/01/22 1430

## 2022-05-01 NOTE — Telephone Encounter (Signed)
Patient discharged due to noncompliant. Letter mailed to patient-Shannon Foley

## 2022-05-01 NOTE — ED Provider Notes (Signed)
[facility] Provider Note    Event Date/Time   First MD Initiated Contact with Patient 05/01/22 1135     (approximate)   History   Abdominal Pain   HPI  Shannon Foley is a 36 y.o. female with history of IBS and as listed below presents to the emergency department for treatment and evaluation of abdominal pain with nausea, vomiting, and some constipation that started yesterday.  Past Medical History:  Diagnosis Date   Back pain    mid and lower back   Chronic abdominal pain    Chronic constipation    Fibroids    one submucosal and one intramural (both  < 1 inch in size)   Headache    caused by pituitary cyst   IBS (irritable bowel syndrome)    Missed abortion    Motion sickness cars   Obesity    Pituitary cyst [facility])    Rathke's pouch cyst     Physical Exam   Triage Vital Signs: ED Triage Vitals  Enc Vitals Group     BP 05/01/22 0952 (!) 150/84     Pulse Rate 05/01/22 0952 81     Resp 05/01/22 0952 16     Temp 05/01/22 0952 98.6 F (37 C)     Temp Source 05/01/22 0952 Oral     SpO2 05/01/22 0952 100 %     Weight 05/01/22 0952 230 lb (104.3 kg)     Height 05/01/22 0952 '5\' 3"'$  (1.6 m)     Head Circumference --      Peak Flow --      Pain Score 05/01/22 0958 8     Pain Loc --      Pain Edu? --      Excl. in [place]? --     Most recent vital signs: Vitals:   05/01/22 1142 05/01/22 1359  BP: (!) 148/80 126/74  Pulse: 78 77  Resp: 16 17  Temp:  98.1 F (36.7 C)  SpO2: 100% 99%    General: Awake, no distress.  CV:  Good peripheral perfusion.  Resp:  Normal effort.  Abd:  No distention.  No focal abdominal tenderness on exam. Other:     ED Results / Procedures / Treatments   Labs (all labs ordered are listed, but only abnormal results are displayed) Labs Reviewed  COMPREHENSIVE METABOLIC PANEL - Abnormal; Notable for the following components:      Result Value   Calcium 8.7 (*)    All other components within normal  limits  CBC - Abnormal; Notable for the following components:   Platelets 409 (*)    All other components within normal limits  URINALYSIS, ROUTINE W REFLEX MICROSCOPIC - Abnormal; Notable for the following components:   Color, Urine YELLOW (*)    APPearance HAZY (*)    Leukocytes,Ua SMALL (*)    All other components within normal limits  LIPASE, BLOOD  PREGNANCY, URINE  POC URINE PREG, ED     EKG     RADIOLOGY    I have independently reviewed and interpreted imaging as well as reviewed report from radiology.  PROCEDURES:  Critical Care performed: No  Procedures   MEDICATIONS ORDERED IN ED:  Medications - No data to display   IMPRESSION / MDM / [place] / ED COURSE   I reviewed the triage vital signs and the nursing notes.  Differential diagnosis includes, but is not limited to: Constipation, IBS, colitis, pancreatitis  Patient's presentation  is most consistent with acute complicated illness / injury requiring diagnostic workup.  37 year old female presenting to the emergency department for treatment and evaluation of abdominal pain.  Labs and urinalysis are pending.  Exam is overall reassuring.  Patient care transition to Dr. Charna Archer who will follow up on labs, pregnancy test, urinalysis, and reassess.      FINAL CLINICAL IMPRESSION(S) / ED DIAGNOSES   Final diagnoses:  Left upper quadrant abdominal pain  Acute gastritis without hemorrhage, unspecified gastritis type     Rx / DC Orders   ED Discharge Orders          Ordered    omeprazole (PRILOSEC OTC) 20 MG tablet  Daily        05/01/22 1429             Note:  This document was prepared using Dragon voice recognition software and may include unintentional dictation errors.   Victorino Dike, FNP 05/01/22 5852    Blake Divine, MD 05/01/22 [phone]

## 2022-05-02 ENCOUNTER — Telehealth: Payer: Self-pay

## 2022-05-02 NOTE — Telephone Encounter (Signed)
Work note emailed to patient. Sent to be scanned-Toni

## 2022-05-12 ENCOUNTER — Ambulatory Visit: Payer: BC Managed Care – PPO | Admitting: Nurse Practitioner

## 2022-05-26 ENCOUNTER — Encounter: Payer: Self-pay | Admitting: Nurse Practitioner

## 2022-05-26 ENCOUNTER — Ambulatory Visit (INDEPENDENT_AMBULATORY_CARE_PROVIDER_SITE_OTHER): Payer: BC Managed Care – PPO | Admitting: Nurse Practitioner

## 2022-05-26 VITALS — BP 139/81 | HR 60 | Ht 63.0 in | Wt 239.8 lb

## 2022-05-26 DIAGNOSIS — B9689 Other specified bacterial agents as the cause of diseases classified elsewhere: Secondary | ICD-10-CM | POA: Diagnosis not present

## 2022-05-26 DIAGNOSIS — N760 Acute vaginitis: Secondary | ICD-10-CM

## 2022-05-26 DIAGNOSIS — Z7689 Persons encountering health services in other specified circumstances: Secondary | ICD-10-CM | POA: Diagnosis not present

## 2022-05-26 DIAGNOSIS — Z6841 Body Mass Index (BMI) 40.0 and over, adult: Secondary | ICD-10-CM | POA: Diagnosis not present

## 2022-05-26 MED ORDER — METRONIDAZOLE 500 MG PO TABS: 500.0000 mg | ORAL_TABLET | Freq: Two times a day (BID) | ORAL | 0 refills | Status: DC

## 2022-05-26 NOTE — Progress Notes (Signed)
New Patient Office Visit  Subjective    Patient ID: Shannon Foley, female    DOB: 05/22/1986  Age: 36 y.o. MRN: [id number]  CC:  Chief Complaint  Patient presents with   New Patient (Initial Visit)    HPI Shannon Foley presents to establish care Transferring from another office in [place] Has had abdominal discomfort.  Went to ER 05/01/2022.  -negative ultrasound  -leukocytes present in urine -calcium level low -has had vaginal discharge for past month. Started when abdominal pain started    Outpatient Encounter Medications as of 05/26/2022  Medication Sig   cyanocobalamin (VITAMIN B12) 1000 MCG/ML injection Inject once a week for 2 weeks and then 1 Per month ( pt needs syringes with needles as well ) self administered   methocarbamol (ROBAXIN) 500 MG tablet Take 1 tablet (500 mg total) by mouth every 8 (eight) hours as needed for muscle spasms.   metroNIDAZOLE (FLAGYL) 500 MG tablet Take 1 tablet (500 mg total) by mouth 2 (two) times daily.   omeprazole (PRILOSEC OTC) 20 MG tablet Take 1 tablet (20 mg total) by mouth daily.   Syringe/Needle, Disp, (SYRINGE 3CC/22GX1") 22G X 1" 3 ML MISC 1 Device by Does not apply route every 30 (thirty) days.   Vitamin D, Ergocalciferol, (DRISDOL) 1.25 MG (50000 UNIT) CAPS capsule TAKE 1 CAPSULE BY MOUTH 1 TIME A WEEK   [DISCONTINUED] dicyclomine (BENTYL) 10 MG capsule Take 1 capsule (10 mg total) by mouth every 6 (six) hours as needed for spasms.   [DISCONTINUED] mupirocin ointment (BACTROBAN) 2 % Apply 1 application. topically 2 (two) times daily. To affected area until resolved.   [DISCONTINUED] triamcinolone cream (KENALOG) 0.1 % Apply 1 application. topically 2 (two) times daily. To affected area.   No facility-administered encounter medications on file as of 05/26/2022.    Past Medical History:  Diagnosis Date   Back pain    mid and lower back   Chronic abdominal pain    Chronic constipation    Fibroids    one submucosal  and one intramural (both  < 1 inch in size)   Headache    caused by pituitary cyst   IBS (irritable bowel syndrome)    Missed abortion    Motion sickness cars   Obesity    Pituitary cyst ([place])    Rathke's pouch cyst    Past Surgical History:  Procedure Laterality Date   COLONOSCOPY     CYSTOSCOPY  05/10/2016   WNL   DILATION AND EVACUATION N/A 04/13/2020   Procedure: SUCTION DILATION AND CURETTAGE;  Surgeon: [name], MD;  Location: [facility];  Service: Gynecology;  Laterality: N/A;   ESOPHAGOGASTRODUODENOSCOPY (EGD) WITH PROPOFOL N/A 02/18/2016   Procedure: ESOPHAGOGASTRODUODENOSCOPY (EGD) WITH PROPOFOL;  Surgeon: Lucilla Lame, MD;  Location: [facility];  Service: Endoscopy;  Laterality: N/A;   TONSILLECTOMY  2017   TONSILLECTOMY      Family History  Problem Relation Age of Onset   Diabetes Mother    Hypertension Mother    Congestive Heart Failure Mother    CVA Father        Had meningioma   Diabetes Father    Hypertension Father    Bladder Cancer Paternal Aunt    Colon cancer Neg Hx    Esophageal cancer Neg Hx    Stomach cancer Neg Hx    Rectal cancer Neg Hx     Social History   Socioeconomic History   Marital status: Single  Spouse name: Not on file   Number of children: 0   Years of education: 14   Highest education level: Not on file  Occupational History    Comment: [employer]  Tobacco Use   Smoking status: Never   Smokeless tobacco: Never  Vaping Use   Vaping Use: Never used  Substance and Sexual Activity   Alcohol use: No   Drug use: No   Sexual activity: Yes    Birth control/protection: None  Other Topics Concern   Not on file  Social History Narrative   Not on file   Social Determinants of Health   Financial Resource Strain: Not on file  Food Insecurity: Not on file  Transportation Needs: Not on file  Physical Activity: Not on file  Stress: Not on file  Social Connections: Not on file  Intimate Partner Violence: Not on  file    Review of Systems  Constitutional:  Positive for malaise/fatigue. Negative for chills and fever.  HENT:  Negative for congestion, sinus pain and sore throat.   Eyes: Negative.   Respiratory:  Negative for cough, shortness of breath and wheezing.   Cardiovascular:  Negative for chest pain, palpitations and leg swelling.  Gastrointestinal:  Positive for abdominal pain. Negative for constipation, diarrhea, nausea and vomiting.  Genitourinary:        Vaginal discourage   Musculoskeletal:  Negative for myalgias.  Skin: Negative.   Neurological:  Negative for dizziness and headaches.  Endo/Heme/Allergies:  Does not bruise/bleed easily.  Psychiatric/Behavioral:  Negative for depression. The patient is nervous/anxious.         Objective    Today's Vitals   05/26/22 1049  BP: 139/81  Pulse: 60  SpO2: 99%  Weight: 239 lb 12.8 oz (108.8 kg)  Height: '5\' 3"'$  (1.6 m)   Body mass index is 42.48 kg/m.   Physical Exam Vitals and nursing note reviewed.  Constitutional:      Appearance: Normal appearance. She is well-developed. She is obese.  HENT:     Head: Normocephalic and atraumatic.     Nose: Nose normal.     Mouth/Throat:     Mouth: Mucous membranes are moist.     Pharynx: Oropharynx is clear.  Eyes:     Extraocular Movements: Extraocular movements intact.     Conjunctiva/sclera: Conjunctivae normal.     Pupils: Pupils are equal, round, and reactive to light.  Cardiovascular:     Rate and Rhythm: Normal rate and regular rhythm.     Pulses: Normal pulses.     Heart sounds: Normal heart sounds.  Pulmonary:     Effort: Pulmonary effort is normal.     Breath sounds: Normal breath sounds.  Abdominal:     General: Bowel sounds are normal. There is no distension.     Palpations: Abdomen is soft. There is no mass.     Tenderness: There is abdominal tenderness. There is no right CVA tenderness, left CVA tenderness, guarding or rebound.     Hernia: No hernia is present.   Musculoskeletal:        General: Normal range of motion.     Cervical back: Normal range of motion and neck supple.  Lymphadenopathy:     Cervical: No cervical adenopathy.  Skin:    General: Skin is warm and dry.     Capillary Refill: Capillary refill takes less than 2 seconds.  Neurological:     General: No focal deficit present.     Mental Status: She is  alert and oriented to person, place, and time.  Psychiatric:        Mood and Affect: Mood normal.        Behavior: Behavior normal.        Thought Content: Thought content normal.        Judgment: Judgment normal.        Assessment & Plan:  1. Bacterial vaginitis Reviewed u/a from recent ER visit. Did have trace WBC in the urine. Will treat for bacterial vaginitis with metronidazole 500 mg twice daily for 7 days. Will repeat u/a if indicated for persistent symptoms.  - metroNIDAZOLE (FLAGYL) 500 MG tablet; Take 1 tablet (500 mg total) by mouth 2 (two) times daily.  Dispense: 14 tablet; Refill: 0  2. BMI 40.0-44.9, adult (HCC) Discussed lowering calorie intake to 1500 calories per day and incorporating exercise into daily routine to help lose weight.   3. Encounter to establish care Appointment today to establish new primary care provider      Problem List Items Addressed This Visit       Genitourinary   Bacterial vaginitis - Primary   Relevant Medications   metroNIDAZOLE (FLAGYL) 500 MG tablet     Other   BMI 40.0-44.9, adult ([place])   Other Visit Diagnoses     Encounter to establish care           Return in about 3 weeks (around 06/16/2022) for abdominal pain .   Ronnell Freshwater, NP

## 2022-06-11 DIAGNOSIS — Z6841 Body Mass Index (BMI) 40.0 and over, adult: Secondary | ICD-10-CM | POA: Insufficient documentation

## 2022-06-16 ENCOUNTER — Encounter: Payer: Self-pay | Admitting: Obstetrics and Gynecology

## 2022-06-18 ENCOUNTER — Encounter: Payer: Self-pay | Admitting: Nurse Practitioner

## 2022-06-19 NOTE — Progress Notes (Signed)
Ronnell Freshwater, NP   Chief Complaint  Patient presents with   Amenorrhea    Monthly cycles 5 days moderate flow moderate pain, September cycle was spotting for a few mins, vaginal sharp pain x few days    HPI:      [name] is a 36 y.o. G1P0010 whose LMP was Patient's last menstrual period was 05/04/2022 (approximate)., presents today for irregular menses since 05/04/22. Had only a few days light spotting 9/23 with neg home UPT. Menses usually monthly, lasting 5 days, mod flow. Hx of pituitary cyst, no tx indicated in past. No recent prolactin labs. Had normal TSH 2/23. No increased stress, med changes, sickness. Hx of several leio, largest 4.0 cm on GYN u/s 10/21. No recent cramping.  Pt also with vaginal pains the past few days. Sx are sharp, intermittent, and last a few hrs. No vaginal bleeding. No aggrav/allev factors. Has had increased vag d/c with irritation, no odor. No recent abx use, no new soaps/detergents. Hx of BV and UTI. Has been having LBP, no urin sx except frequency with good flow. No GI sx. No fevers. Hx of chronic LBP.  Last sexually active 7/23, no new partners, no STD concern. No pain/bleeding with sex.  Would like referral to REI. Would like to conceive and wants sperm donation. S/p SAB 2021, no conception since.   Patient Active Problem List   Diagnosis Date Noted   BMI 40.0-44.9, adult ([place]) 06/11/2022   Bacterial vaginitis 08/02/2021   Recurrent pansinusitis 10/08/2020   Abnormal biochemical finding on antenatal screening of mother 04/01/2020   Vaginal candida 03/18/2019   CIN I (cervical intraepithelial neoplasia I) 02/13/2019   Acute otitis media 01/06/2019   Acute upper respiratory infection 12/08/2018   Fever and chills 12/08/2018   Sore throat 12/08/2018   Hiatal hernia 06/23/2018   Left hip pain 06/23/2018   Needs flu shot 06/23/2018   Uterine fibroid 06/13/2018   IBS (irritable bowel syndrome) 05/29/2018   Low grade squamous  intraepithelial lesion (LGSIL) on cervical Pap smear 05/27/2018   Encounter for general adult medical examination with abnormal findings 04/03/2018   Gastroesophageal reflux disease without esophagitis 04/03/2018   Chronic midline low back pain without sciatica 04/03/2018   Chronic midline thoracic back pain 04/03/2018   Urinary tract infection without hematuria 04/03/2018   Dysuria 04/03/2018   Chest pain 01/08/2018   Epigastric abdominal pain 01/08/2018   Abnormal findings-gastrointestinal tract    Rathke's cleft cyst ([place]) 02/02/2016   Anemia 04/29/2014   Fatigue 04/29/2014   Hyperprolactinemia ([place] [address]) 04/29/2014   Vitamin B 12 deficiency 04/29/2014   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 04/29/2014   Pituitary cyst ([place]) 02/19/2014   New onset of headaches 02/19/2014   Other enthesopathy of unspecified foot 12/18/2013   Pain in foot 12/18/2013   Dupuytren's contracture of foot 12/18/2013   Plantar fasciitis 12/18/2013   Calcaneal bursitis 12/18/2013   Plantar fascial fibromatosis 12/18/2013    Past Surgical History:  Procedure Laterality Date   COLONOSCOPY     CYSTOSCOPY  05/10/2016   WNL   DILATION AND EVACUATION N/A 04/13/2020   Procedure: SUCTION DILATION AND CURETTAGE;  Surgeon: [name], MD;  Location: [facility];  Service: Gynecology;  Laterality: N/A;   ESOPHAGOGASTRODUODENOSCOPY (EGD) WITH PROPOFOL N/A 02/18/2016   Procedure: ESOPHAGOGASTRODUODENOSCOPY (EGD) WITH PROPOFOL;  Surgeon: Lucilla Lame, MD;  Location: [facility];  Service: Endoscopy;  Laterality: N/A;   TONSILLECTOMY  2017  TONSILLECTOMY      Family History  Problem Relation Age of Onset   Diabetes Mother    Hypertension Mother    Congestive Heart Failure Mother    CVA Father        Had meningioma   Diabetes Father    Hypertension Father    Bladder Cancer Paternal Aunt    Colon cancer Neg Hx    Esophageal cancer Neg Hx    Stomach cancer Neg Hx     Rectal cancer Neg Hx     Social History   Socioeconomic History   Marital status: Single    Spouse name: Not on file   Number of children: 0   Years of education: 14   Highest education level: Not on file  Occupational History    Comment: [employer]  Tobacco Use   Smoking status: Never   Smokeless tobacco: Never  Vaping Use   Vaping Use: Never used  Substance and Sexual Activity   Alcohol use: No   Drug use: No   Sexual activity: Not Currently    Birth control/protection: None  Other Topics Concern   Not on file  Social History Narrative   Not on file   Social Determinants of Health   Financial Resource Strain: Not on file  Food Insecurity: Not on file  Transportation Needs: Not on file  Physical Activity: Not on file  Stress: Not on file  Social Connections: Not on file  Intimate Partner Violence: Not on file    Outpatient Medications Prior to Visit  Medication Sig Dispense Refill   cyanocobalamin (VITAMIN B12) 1000 MCG/ML injection Inject once a week for 2 weeks and then 1 Per month ( pt needs syringes with needles as well ) self administered 2 mL 4   Syringe/Needle, Disp, (SYRINGE 3CC/22GX1") 22G X 1" 3 ML MISC 1 Device by Does not apply route every 30 (thirty) days. 10 each 3   Vitamin D, Ergocalciferol, (DRISDOL) 1.25 MG (50000 UNIT) CAPS capsule TAKE 1 CAPSULE BY MOUTH 1 TIME A WEEK 4 capsule 1   methocarbamol (ROBAXIN) 500 MG tablet Take 1 tablet (500 mg total) by mouth every 8 (eight) hours as needed for muscle spasms. 60 tablet 0   metroNIDAZOLE (FLAGYL) 500 MG tablet Take 1 tablet (500 mg total) by mouth 2 (two) times daily. 14 tablet 0   omeprazole (PRILOSEC OTC) 20 MG tablet Take 1 tablet (20 mg total) by mouth daily. 28 tablet 1   No facility-administered medications prior to visit.      ROS:  Review of Systems  Constitutional:  Negative for fever.  Gastrointestinal:  Negative for blood in stool, constipation, diarrhea, nausea and vomiting.   Genitourinary:  Positive for frequency, menstrual problem, vaginal discharge and vaginal pain. Negative for dyspareunia, dysuria, flank pain, hematuria, urgency and vaginal bleeding.  Musculoskeletal:  Positive for back pain.  Skin:  Negative for rash.   BREAST: No symptoms   OBJECTIVE:   Vitals:  BP 120/80   Ht '5\' 3"'$  (1.6 m)   Wt 237 lb (107.5 kg)   LMP 05/04/2022 (Approximate)   BMI 41.98 kg/m   Physical Exam Vitals reviewed.  Constitutional:      Appearance: She is well-developed.  Pulmonary:     Effort: Pulmonary effort is normal.  Abdominal:     Tenderness: There is abdominal tenderness in the left lower quadrant. There is no right CVA tenderness or left CVA tenderness.  Genitourinary:    General: Normal vulva.  Pubic Area: No rash.      Labia:        Right: No rash, tenderness or lesion.        Left: No rash, tenderness or lesion.      Vagina: Normal. No vaginal discharge, erythema or tenderness.     Cervix: Normal.     Uterus: Normal. Not enlarged and not tender.      Adnexa: Right adnexa normal.       Right: No mass or tenderness.         Left: Tenderness present. No mass.    Musculoskeletal:        General: Normal range of motion.       Arms:     Cervical back: Normal range of motion.  Skin:    General: Skin is warm and dry.  Neurological:     General: No focal deficit present.     Mental Status: She is alert and oriented to person, place, and time.  Psychiatric:        Mood and Affect: Mood normal.        Behavior: Behavior normal.        Thought Content: Thought content normal.        Judgment: Judgment normal.     Results: Results for orders placed or performed in visit on 06/20/22 (from the past 24 hour(s))  POCT urine pregnancy     Status: Normal   Collection Time: 06/20/22 11:38 AM  Result Value Ref Range   Preg Test, Ur Negative Negative  POCT Urinalysis Dipstick     Status: Abnormal   Collection Time: 06/20/22 11:38 AM  Result Value  Ref Range   Color, UA     Clarity, UA     Glucose, UA Negative Negative   Bilirubin, UA neg    Ketones, UA neg    Spec Grav, UA 1.020 1.010 - 1.025   Blood, UA neg    pH, UA 7.0 5.0 - 8.0   Protein, UA Positive (A) Negative   Urobilinogen, UA 0.2 0.2 or 1.0 E.U./dL   Nitrite, UA neg    Leukocytes, UA Negative Negative   Appearance     Odor    POCT Wet Prep with KOH     Status: Normal   Collection Time: 06/20/22 12:33 PM  Result Value Ref Range   Trichomonas, UA Negative    Clue Cells Wet Prep HPF POC neg    Epithelial Wet Prep HPF POC     Yeast Wet Prep HPF POC neg    Bacteria Wet Prep HPF POC     RBC Wet Prep HPF POC     WBC Wet Prep HPF POC     KOH Prep POC Negative Negative     Assessment/Plan: Irregular periods - Plan: POCT urine pregnancy, POCT Urinalysis Dipstick, Prolactin; for 1 cycle, neg UPT. Check prolactin. If WNL, see if menses resumes 10/23. If not, will do provera challenge.  Pituitary cyst ([place]) - Plan: Prolactin  Vaginal pain--neg exam, can't reproduce on exam. Question related to irreg menses. Reassurance. F/u prn.   Vaginal discharge - Plan: POCT Wet Prep with KOH; neg wet prep/exam. F/u prn.   Low back pain--neg UA, neg exam. Question MSK. F/u prn.   Infertility counseling - Plan: Ambulatory referral to Infertility; refer to REI.     Return if symptoms worsen or fail to improve.  Alauna Hayden B. Sofiah Lyne, PA-C 06/20/2022 12:35 PM

## 2022-06-19 NOTE — Telephone Encounter (Signed)
Pls. call pt to schedule appt..

## 2022-06-19 NOTE — Telephone Encounter (Signed)
I contacted patient via phone. Patient is scheduled for 06/20/22 at 11:15 with [facility]

## 2022-06-20 ENCOUNTER — Encounter: Payer: Self-pay | Admitting: Obstetrics and Gynecology

## 2022-06-20 ENCOUNTER — Ambulatory Visit (INDEPENDENT_AMBULATORY_CARE_PROVIDER_SITE_OTHER): Payer: BC Managed Care – PPO | Admitting: Obstetrics and Gynecology

## 2022-06-20 VITALS — BP 120/80 | Ht 63.0 in | Wt 237.0 lb

## 2022-06-20 DIAGNOSIS — N898 Other specified noninflammatory disorders of vagina: Secondary | ICD-10-CM

## 2022-06-20 DIAGNOSIS — E236 Other disorders of pituitary gland: Secondary | ICD-10-CM

## 2022-06-20 DIAGNOSIS — R102 Pelvic and perineal pain: Secondary | ICD-10-CM

## 2022-06-20 DIAGNOSIS — M545 Low back pain, unspecified: Secondary | ICD-10-CM

## 2022-06-20 DIAGNOSIS — Z3202 Encounter for pregnancy test, result negative: Secondary | ICD-10-CM

## 2022-06-20 DIAGNOSIS — N926 Irregular menstruation, unspecified: Secondary | ICD-10-CM

## 2022-06-20 DIAGNOSIS — G8929 Other chronic pain: Secondary | ICD-10-CM

## 2022-06-20 DIAGNOSIS — Z3169 Encounter for other general counseling and advice on procreation: Secondary | ICD-10-CM

## 2022-06-20 LAB — POCT URINALYSIS DIPSTICK
Bilirubin, UA: NEGATIVE
Blood, UA: NEGATIVE
Glucose, UA: NEGATIVE
Ketones, UA: NEGATIVE
Leukocytes, UA: NEGATIVE
Nitrite, UA: NEGATIVE
Protein, UA: POSITIVE — AB
Spec Grav, UA: 1.02 (ref 1.010–1.025)
Urobilinogen, UA: 0.2 E.U./dL
pH, UA: 7 (ref 5.0–8.0)

## 2022-06-20 LAB — POCT WET PREP WITH KOH
Clue Cells Wet Prep HPF POC: NEGATIVE
KOH Prep POC: NEGATIVE
Trichomonas, UA: NEGATIVE
Yeast Wet Prep HPF POC: NEGATIVE

## 2022-06-20 LAB — POCT URINE PREGNANCY: Preg Test, Ur: NEGATIVE

## 2022-06-21 LAB — PROLACTIN: Prolactin: 14 ng/mL (ref 4.8–23.3)

## 2022-06-23 ENCOUNTER — Ambulatory Visit: Payer: Medicare Other | Admitting: Podiatry

## 2022-06-23 ENCOUNTER — Encounter: Payer: Self-pay | Admitting: Nurse Practitioner

## 2022-06-23 ENCOUNTER — Ambulatory Visit (INDEPENDENT_AMBULATORY_CARE_PROVIDER_SITE_OTHER): Payer: BC Managed Care – PPO | Admitting: Nurse Practitioner

## 2022-06-23 VITALS — BP 131/85 | HR 71 | Ht 63.0 in | Wt 236.1 lb

## 2022-06-23 DIAGNOSIS — E559 Vitamin D deficiency, unspecified: Secondary | ICD-10-CM

## 2022-06-23 DIAGNOSIS — N390 Urinary tract infection, site not specified: Secondary | ICD-10-CM | POA: Diagnosis not present

## 2022-06-23 DIAGNOSIS — Z6841 Body Mass Index (BMI) 40.0 and over, adult: Secondary | ICD-10-CM

## 2022-06-23 DIAGNOSIS — R7301 Impaired fasting glucose: Secondary | ICD-10-CM

## 2022-06-23 DIAGNOSIS — R5383 Other fatigue: Secondary | ICD-10-CM

## 2022-06-23 DIAGNOSIS — Z0000 Encounter for general adult medical examination without abnormal findings: Secondary | ICD-10-CM | POA: Diagnosis not present

## 2022-06-23 LAB — POCT URINALYSIS DIP (CLINITEK)
Bilirubin, UA: NEGATIVE
Blood, UA: NEGATIVE
Glucose, UA: NEGATIVE mg/dL
Ketones, POC UA: NEGATIVE mg/dL
Nitrite, UA: NEGATIVE
POC PROTEIN,UA: NEGATIVE
Spec Grav, UA: 1.025 (ref 1.010–1.025)
Urobilinogen, UA: 0.2 E.U./dL
pH, UA: 5.5 (ref 5.0–8.0)

## 2022-06-23 MED ORDER — AMOXICILLIN-POT CLAVULANATE 875-125 MG PO TABS: 1.0000 | ORAL_TABLET | Freq: Two times a day (BID) | ORAL | 0 refills | Status: DC

## 2022-06-23 NOTE — Progress Notes (Signed)
Established patient visit   Patient: Shannon Foley   DOB: 09-Mar-1986   36 y.o. Female  MRN: [id number] Visit Date: 06/23/2022   Chief Complaint  Patient presents with   Abdominal Pain   Subjective    HPI  Lower abdominal pain and flank pain.  -few weeks  -did see OB/GYN provider  --Wet prep was normal --u/a did show small protein.  -today, u/a showing trace WBC without protein -she is concerned about diabetes  -has not had HgbA1c checked in some time.  -denies nausea and vomiting -denies fever.    Medications: Outpatient Medications Prior to Visit  Medication Sig   cyanocobalamin (VITAMIN B12) 1000 MCG/ML injection Inject once a week for 2 weeks and then 1 Per month ( pt needs syringes with needles as well ) self administered   Syringe/Needle, Disp, (SYRINGE 3CC/22GX1") 22G X 1" 3 ML MISC 1 Device by Does not apply route every 30 (thirty) days.   Vitamin D, Ergocalciferol, (DRISDOL) 1.25 MG (50000 UNIT) CAPS capsule TAKE 1 CAPSULE BY MOUTH 1 TIME A WEEK   No facility-administered medications prior to visit.    Review of Systems  Constitutional:  Positive for appetite change and fatigue. Negative for activity change, chills and fever.  HENT:  Negative for congestion, postnasal drip, rhinorrhea, sinus pressure, sinus pain, sneezing and sore throat.   Eyes: Negative.   Respiratory:  Negative for cough, chest tightness, shortness of breath and wheezing.   Cardiovascular:  Negative for chest pain and palpitations.  Gastrointestinal:  Positive for abdominal pain and nausea. Negative for constipation, diarrhea and vomiting.  Endocrine: Negative for cold intolerance, heat intolerance, polydipsia and polyuria.  Genitourinary:  Positive for dysuria, flank pain, frequency, pelvic pain and urgency. Negative for dyspareunia.  Musculoskeletal:  Negative for arthralgias, back pain and myalgias.  Skin:  Negative for rash.  Allergic/Immunologic: Negative for environmental allergies.   Neurological:  Negative for dizziness, weakness and headaches.  Hematological:  Negative for adenopathy.  Psychiatric/Behavioral:  The patient is not nervous/anxious.     Last CBC Lab Results  Component Value Date   WBC 8.7 06/23/2022   HGB 14.1 06/23/2022   HCT 41.6 06/23/2022   MCV 89 06/23/2022   MCH 30.1 06/23/2022   RDW 13.8 06/23/2022   PLT 438 62/70/3500   Last metabolic panel Lab Results  Component Value Date   GLUCOSE 82 06/23/2022   NA 137 06/23/2022   K 4.5 06/23/2022   CL 101 06/23/2022   CO2 22 06/23/2022   BUN 10 06/23/2022   CREATININE 0.79 06/23/2022   EGFR 99 06/23/2022   CALCIUM 9.2 06/23/2022   PROT 7.3 06/23/2022   ALBUMIN 4.2 06/23/2022   LABGLOB 3.1 06/23/2022   AGRATIO 1.4 06/23/2022   BILITOT 0.3 06/23/2022   ALKPHOS 96 06/23/2022   AST 13 06/23/2022   ALT 11 06/23/2022   ANIONGAP 5 05/01/2022   Last lipids Lab Results  Component Value Date   CHOL 173 06/23/2022   HDL 73 06/23/2022   LDLCALC 92 06/23/2022   TRIG 35 06/23/2022   CHOLHDL 2.4 06/23/2022   Last hemoglobin A1c Lab Results  Component Value Date   HGBA1C 5.8 (H) 06/23/2022   Last thyroid functions Lab Results  Component Value Date   TSH 1.770 06/23/2022   Last vitamin D Lab Results  Component Value Date   VD25OH 30.9 06/23/2022       Objective     Today's Vitals   06/23/22 1026 06/23/22 1106  BP: (  Abnormal) 133/90 131/85  Pulse: 71   SpO2: 97%   Weight: 236 lb 1.9 oz (107.1 kg)   Height: 5' 3"  (1.6 m)    Body mass index is 41.83 kg/m.   BP Readings from Last 3 Encounters:  06/23/22 131/85  06/20/22 120/80  05/26/22 139/81    Wt Readings from Last 3 Encounters:  06/23/22 236 lb 1.9 oz (107.1 kg)  06/20/22 237 lb (107.5 kg)  05/26/22 239 lb 12.8 oz (108.8 kg)    Physical Exam Vitals and nursing note reviewed.  Constitutional:      Appearance: Normal appearance. She is well-developed. She is ill-appearing.  HENT:     Head: Normocephalic and  atraumatic.     Nose: Nose normal.     Mouth/Throat:     Mouth: Mucous membranes are moist.     Pharynx: Oropharynx is clear.  Eyes:     Extraocular Movements: Extraocular movements intact.     Conjunctiva/sclera: Conjunctivae normal.     Pupils: Pupils are equal, round, and reactive to light.  Cardiovascular:     Rate and Rhythm: Normal rate and regular rhythm.     Pulses: Normal pulses.     Heart sounds: Normal heart sounds.  Pulmonary:     Effort: Pulmonary effort is normal.     Breath sounds: Normal breath sounds.  Abdominal:     General: Bowel sounds are normal. There is no distension.     Palpations: Abdomen is soft. There is no mass.     Tenderness: There is abdominal tenderness. There is no right CVA tenderness, left CVA tenderness, guarding or rebound.     Hernia: No hernia is present.  Genitourinary:    Comments: Urine sample positive for trace WBC today.  Musculoskeletal:        General: Normal range of motion.     Cervical back: Normal range of motion and neck supple.  Lymphadenopathy:     Cervical: No cervical adenopathy.  Skin:    General: Skin is warm and dry.     Capillary Refill: Capillary refill takes less than 2 seconds.  Neurological:     General: No focal deficit present.     Mental Status: She is alert and oriented to person, place, and time.  Psychiatric:        Mood and Affect: Mood normal.        Behavior: Behavior normal.        Thought Content: Thought content normal.        Judgment: Judgment normal.     Results for orders placed or performed in visit on 06/23/22  Urine Culture   Specimen: Urine   Urine  Result Value Ref Range   Urine Culture, Routine Final report    Organism ID, Bacteria Comment   TSH + free T4  Result Value Ref Range   TSH 1.770 0.450 - 4.500 uIU/mL   Free T4 1.19 0.82 - 1.77 ng/dL  VITAMIN D 25 Hydroxy (Vit-D Deficiency, Fractures)  Result Value Ref Range   Vit D, 25-Hydroxy 30.9 30.0 - 100.0 ng/mL  Hemoglobin A1c   Result Value Ref Range   Hgb A1c MFr Bld 5.8 (H) 4.8 - 5.6 %   Est. average glucose Bld gHb Est-mCnc 120 mg/dL  Lipid panel  Result Value Ref Range   Cholesterol, Total 173 100 - 199 mg/dL   Triglycerides 35 0 - 149 mg/dL   HDL 73 >39 mg/dL   VLDL Cholesterol Cal 8 5 - 40 mg/dL  LDL Chol Calc (NIH) 92 0 - 99 mg/dL   Chol/HDL Ratio 2.4 0.0 - 4.4 ratio  Comprehensive metabolic panel  Result Value Ref Range   Glucose 82 70 - 99 mg/dL   BUN 10 6 - 20 mg/dL   Creatinine, Ser 0.79 0.57 - 1.00 mg/dL   eGFR 99 >59 mL/min/1.73   BUN/Creatinine Ratio 13 9 - 23   Sodium 137 134 - 144 mmol/L   Potassium 4.5 3.5 - 5.2 mmol/L   Chloride 101 96 - 106 mmol/L   CO2 22 20 - 29 mmol/L   Calcium 9.2 8.7 - 10.2 mg/dL   Total Protein 7.3 6.0 - 8.5 g/dL   Albumin 4.2 3.9 - 4.9 g/dL   Globulin, Total 3.1 1.5 - 4.5 g/dL   Albumin/Globulin Ratio 1.4 1.2 - 2.2   Bilirubin Total 0.3 0.0 - 1.2 mg/dL   Alkaline Phosphatase 96 44 - 121 IU/L   AST 13 0 - 40 IU/L   ALT 11 0 - 32 IU/L  CBC  Result Value Ref Range   WBC 8.7 3.4 - 10.8 x10E3/uL   RBC 4.68 3.77 - 5.28 x10E6/uL   Hemoglobin 14.1 11.1 - 15.9 g/dL   Hematocrit 41.6 34.0 - 46.6 %   MCV 89 79 - 97 fL   MCH 30.1 26.6 - 33.0 pg   MCHC 33.9 31.5 - 35.7 g/dL   RDW 13.8 11.7 - 15.4 %   Platelets 438 150 - 450 x10E3/uL  POCT URINALYSIS DIP (CLINITEK)  Result Value Ref Range   Color, UA yellow yellow   Clarity, UA clear clear   Glucose, UA negative negative mg/dL   Bilirubin, UA negative negative   Ketones, POC UA negative negative mg/dL   Spec Grav, UA 1.025 1.010 - 1.025   Blood, UA negative negative   pH, UA 5.5 5.0 - 8.0   POC PROTEIN,UA negative negative, trace   Urobilinogen, UA 0.2 0.2 or 1.0 E.U./dL   Nitrite, UA Negative Negative   Leukocytes, UA Trace (A) Negative    Assessment & Plan    1. Urinary tract infection without hematuria, site unspecified U/a positive fr trace WBC. Treat with augmentin 875 mg twice daily for 10  days. Send urine for culture and sensitivity and adjust treatment as indicate  - amoxicillin-clavulanate (AUGMENTIN) 875-125 MG tablet; Take 1 tablet by mouth 2 (two) times daily.  Dispense: 20 tablet; Refill: 0 - Urine Culture; Future - POCT URINALYSIS DIP (CLINITEK) - Urine Culture  2. Other fatigue Check thyroid panel, blood count, and HgbA1c for further evaluation.  - TSH + free T4 - Hemoglobin A1c - Lipid panel - Comprehensive metabolic panel - CBC  3. Vitamin D deficiency Check vitamin d level and treat deficiency as indicated  - VITAMIN D 25 Hydroxy (Vit-D Deficiency, Fractures)  4. Impaired fasting glucose HgbA1c 5.8 today. Advised she limit intake of carbohydrates and sugar and increase water in her diet. She should add exercise into her daily routine.  - Hemoglobin A1c - Comprehensive metabolic panel - CBC  5. BMI 40.0-44.9, adult (HCC) Discussed lowering calorie intake to 1500 calories per day and incorporating exercise into daily routine to help lose weight.  6. Healthcare maintenance Routine, fasting labs drawn during today's visit.  - TSH + free T4 - Lipid panel - Comprehensive metabolic panel - CBC    Problem List Items Addressed This Visit       Endocrine   Impaired fasting glucose   Relevant Orders   Hemoglobin  A1c (Completed)   Comprehensive metabolic panel (Completed)   CBC (Completed)     Genitourinary   Urinary tract infection without hematuria - Primary   Relevant Medications   amoxicillin-clavulanate (AUGMENTIN) 875-125 MG tablet   Other Relevant Orders   Urine Culture (Completed)   POCT URINALYSIS DIP (CLINITEK) (Completed)     Other   Fatigue   Relevant Orders   TSH + free T4 (Completed)   Hemoglobin A1c (Completed)   Lipid panel (Completed)   Comprehensive metabolic panel (Completed)   CBC (Completed)   BMI 40.0-44.9, adult (HCC)   Vitamin D deficiency   Relevant Orders   VITAMIN D 25 Hydroxy (Vit-D Deficiency, Fractures)  (Completed)   Other Visit Diagnoses     Healthcare maintenance       Relevant Orders   TSH + free T4 (Completed)   Lipid panel (Completed)   Comprehensive metabolic panel (Completed)   CBC (Completed)        Return for prn worsening or persistent symptoms.         Ronnell Freshwater, NP  [facility] [phone] (phone) [phone] (fax)  [place]

## 2022-06-24 LAB — COMPREHENSIVE METABOLIC PANEL
ALT: 11 IU/L (ref 0–32)
AST: 13 IU/L (ref 0–40)
Albumin/Globulin Ratio: 1.4 (ref 1.2–2.2)
Albumin: 4.2 g/dL (ref 3.9–4.9)
Alkaline Phosphatase: 96 IU/L (ref 44–121)
BUN/Creatinine Ratio: 13 (ref 9–23)
BUN: 10 mg/dL (ref 6–20)
Bilirubin Total: 0.3 mg/dL (ref 0.0–1.2)
CO2: 22 mmol/L (ref 20–29)
Calcium: 9.2 mg/dL (ref 8.7–10.2)
Chloride: 101 mmol/L (ref 96–106)
Creatinine, Ser: 0.79 mg/dL (ref 0.57–1.00)
Globulin, Total: 3.1 g/dL (ref 1.5–4.5)
Glucose: 82 mg/dL (ref 70–99)
Potassium: 4.5 mmol/L (ref 3.5–5.2)
Sodium: 137 mmol/L (ref 134–144)
Total Protein: 7.3 g/dL (ref 6.0–8.5)
eGFR: 99 mL/min/{1.73_m2} (ref >59)

## 2022-06-24 LAB — TSH+FREE T4
Free T4: 1.19 ng/dL (ref 0.82–1.77)
TSH: 1.77 u[IU]/mL (ref 0.450–4.500)

## 2022-06-24 LAB — HEMOGLOBIN A1C
Est. average glucose Bld gHb Est-mCnc: 120 mg/dL
Hgb A1c MFr Bld: 5.8 % — ABNORMAL HIGH (ref 4.8–5.6)

## 2022-06-24 LAB — CBC
Hematocrit: 41.6 % (ref 34.0–46.6)
Hemoglobin: 14.1 g/dL (ref 11.1–15.9)
MCH: 30.1 pg (ref 26.6–33.0)
MCHC: 33.9 g/dL (ref 31.5–35.7)
MCV: 89 fL (ref 79–97)
Platelets: 438 10*3/uL (ref 150–450)
RBC: 4.68 x10E6/uL (ref 3.77–5.28)
RDW: 13.8 % (ref 11.7–15.4)
WBC: 8.7 10*3/uL (ref 3.4–10.8)

## 2022-06-24 LAB — LIPID PANEL
Chol/HDL Ratio: 2.4 ratio (ref 0.0–4.4)
Cholesterol, Total: 173 mg/dL (ref 100–199)
HDL: 73 mg/dL (ref >39)
LDL Chol Calc (NIH): 92 mg/dL (ref 0–99)
Triglycerides: 35 mg/dL (ref 0–149)
VLDL Cholesterol Cal: 8 mg/dL (ref 5–40)

## 2022-06-24 LAB — VITAMIN D 25 HYDROXY (VIT D DEFICIENCY, FRACTURES): Vit D, 25-Hydroxy: 30.9 ng/mL (ref 30.0–100.0)

## 2022-06-26 LAB — URINE CULTURE

## 2022-07-03 ENCOUNTER — Encounter: Payer: Self-pay | Admitting: Nurse Practitioner

## 2022-07-07 ENCOUNTER — Ambulatory Visit: Payer: Medicare Other | Admitting: Podiatry

## 2022-07-13 DIAGNOSIS — R7301 Impaired fasting glucose: Secondary | ICD-10-CM | POA: Insufficient documentation

## 2022-07-13 DIAGNOSIS — E559 Vitamin D deficiency, unspecified: Secondary | ICD-10-CM | POA: Insufficient documentation

## 2022-07-14 ENCOUNTER — Ambulatory Visit: Payer: Medicare Other | Admitting: Podiatry

## 2022-08-04 ENCOUNTER — Encounter: Payer: Self-pay | Admitting: Certified Nurse Midwife

## 2022-08-04 ENCOUNTER — Ambulatory Visit (INDEPENDENT_AMBULATORY_CARE_PROVIDER_SITE_OTHER): Payer: BC Managed Care – PPO | Admitting: Certified Nurse Midwife

## 2022-08-04 VITALS — BP 125/87 | Ht 63.0 in | Wt 238.2 lb

## 2022-08-04 DIAGNOSIS — N939 Abnormal uterine and vaginal bleeding, unspecified: Secondary | ICD-10-CM | POA: Diagnosis not present

## 2022-08-04 DIAGNOSIS — R5383 Other fatigue: Secondary | ICD-10-CM | POA: Diagnosis not present

## 2022-08-04 MED ORDER — TRANEXAMIC ACID 650 MG PO TABS: 1300.0000 mg | ORAL_TABLET | Freq: Three times a day (TID) | ORAL | 0 refills | Status: AC

## 2022-08-04 NOTE — Progress Notes (Signed)
GYN ENCOUNTER NOTE  Subjective:       Shannon Foley is a 36 y.o. G58P0010 female is here for gynecologic evaluation of the following issues:  1. Abnormal uterine bleeding. Pt states she has been bleeding heavy since 07/04/22. States she is wearing super tampons that she is changing every 1 hr . She is passing clots grape to quarter size. She has been feeling dizzy and fatigued. She has history of uterine fibroids.    Gynecologic History Patient's last menstrual period was 07/10/2022 (exact date). Contraception: none Last Pap: 02/11/2020. Results were: normal/negative HPV Last mammogram: n/a.   Obstetric History OB History  Gravida Para Term Preterm AB Living  1 0 0 0 1 0  SAB IAB Ectopic Multiple Live Births  1 0 0 0 0    # Outcome Date GA Lbr Len/2nd Weight Sex Delivery Anes PTL Lv  1 SAB             Past Medical History:  Diagnosis Date   Back pain    mid and lower back   Chronic abdominal pain    Chronic constipation    Fibroids    one submucosal and one intramural (both  < 1 inch in size)   Headache    caused by pituitary cyst   IBS (irritable bowel syndrome)    Missed abortion    Motion sickness cars   Obesity    Pituitary cyst ([place])    Rathke's pouch cyst    Past Surgical History:  Procedure Laterality Date   COLONOSCOPY     CYSTOSCOPY  05/10/2016   WNL   DILATION AND EVACUATION N/A 04/13/2020   Procedure: SUCTION DILATION AND CURETTAGE;  Surgeon: [name], MD;  Location: [facility];  Service: Gynecology;  Laterality: N/A;   ESOPHAGOGASTRODUODENOSCOPY (EGD) WITH PROPOFOL N/A 02/18/2016   Procedure: ESOPHAGOGASTRODUODENOSCOPY (EGD) WITH PROPOFOL;  Surgeon: Lucilla Lame, MD;  Location: [facility];  Service: Endoscopy;  Laterality: N/A;   TONSILLECTOMY  2017   TONSILLECTOMY      Current Outpatient Medications on File Prior to Visit  Medication Sig Dispense Refill   amoxicillin-clavulanate (AUGMENTIN) 875-125 MG tablet Take 1 tablet by mouth 2  (two) times daily. 20 tablet 0   cyanocobalamin (VITAMIN B12) 1000 MCG/ML injection Inject once a week for 2 weeks and then 1 Per month ( pt needs syringes with needles as well ) self administered 2 mL 4   Syringe/Needle, Disp, (SYRINGE 3CC/22GX1") 22G X 1" 3 ML MISC 1 Device by Does not apply route every 30 (thirty) days. 10 each 3   Vitamin D, Ergocalciferol, (DRISDOL) 1.25 MG (50000 UNIT) CAPS capsule TAKE 1 CAPSULE BY MOUTH 1 TIME A WEEK 4 capsule 1   No current facility-administered medications on file prior to visit.    No Known Allergies  Social History   Socioeconomic History   Marital status: Single    Spouse name: Not on file   Number of children: 0   Years of education: 14   Highest education level: Not on file  Occupational History    Comment: [employer]  Tobacco Use   Smoking status: Never   Smokeless tobacco: Never  Vaping Use   Vaping Use: Never used  Substance and Sexual Activity   Alcohol use: No   Drug use: No   Sexual activity: Not Currently    Birth control/protection: None  Other Topics Concern   Not on file  Social History Narrative   Not on file  Social Determinants of Health   Financial Resource Strain: Not on file  Food Insecurity: Not on file  Transportation Needs: Not on file  Physical Activity: Not on file  Stress: Not on file  Social Connections: Not on file  Intimate Partner Violence: Not on file    Family History  Problem Relation Age of Onset   Diabetes Mother    Hypertension Mother    Congestive Heart Failure Mother    CVA Father        Had meningioma   Diabetes Father    Hypertension Father    Bladder Cancer Paternal Aunt    Colon cancer Neg Hx    Esophageal cancer Neg Hx    Stomach cancer Neg Hx    Rectal cancer Neg Hx     The following portions of the patient's history were reviewed and updated as appropriate: allergies, current medications, past family history, past medical history, past social history, past surgical  history and problem list.  Review of Systems Review of Systems - Negative except as mentioned in HPI Review of Systems - General ROS: negative for - chills, fatigue, fever, hot flashes, malaise or night sweats Hematological and Lymphatic ROS: negative for - bleeding problems or swollen lymph nodes Gastrointestinal ROS: negative for - abdominal pain, blood in stools, change in bowel habits and nausea/vomiting Musculoskeletal ROS: negative for - joint pain, muscle pain or muscular weakness Genito-Urinary ROS: negative for -  dysmenorrhea, dyspareunia, dysuria, genital discharge, genital ulcers, hematuria, incontinence, irregular/heavy menses, nocturia or pelvic pain. Positive for abnormal uterine bleeding  Objective:   BP (!) 150/83   Ht '5\' 3"'$  (1.6 m)   Wt 238 lb 3.2 oz (108 kg)   LMP 07/10/2022 (Exact Date)   BMI 42.20 kg/m  CONSTITUTIONAL: Well-developed, well-nourished female in no acute distress.  HENT:  Normocephalic, atraumatic.  NECK: Normal range of motion, supple, no masses.  Normal thyroid.  SKIN: Skin is warm and dry. No rash noted. Not diaphoretic. No erythema. No pallor. [place]: Alert and oriented to person, place, and time. PSYCHIATRIC: Normal mood and affect. Normal behavior. Normal judgment and thought content. CARDIOVASCULAR:Not Examined RESPIRATORY: Not Examined BREASTS: Not Examined ABDOMEN: Soft, non distended; Non tender.  No Organomegaly. PELVIC: not indicated  MUSCULOSKELETAL: Normal range of motion. No tenderness.  No cyanosis, clubbing, or edema.     Assessment:   Abnormal uterine bleeding    Plan:   Discussed treatment options including birth control , lysteda, progestin, use of over the counter Ibuprofen. Discussed u/s to evaluate change in fibroid and endometrium. Orders placed for lysteda. Pt denies any contra indications to use. Labs CBC and Iron due to fatigue and dizziness. Will follow up with results.   Face to face time 15 min.   Philip Aspen, CNM

## 2022-08-04 NOTE — Patient Instructions (Signed)
Abnormal Uterine Bleeding Abnormal uterine bleeding means bleeding more than normal from your womb (uterus). It can include: Bleeding after sex. Bleeding between monthly (menstrual) periods. Bleeding that is heavier than normal. Monthly periods that last longer than normal. Bleeding after you have stopped having your monthly period (menopause). You should see a doctor for any kind of bleeding that is not normal. Treatment depends on the cause of your bleeding and how much you bleed. Follow these instructions at home: Medicines Take over-the-counter and prescription medicines only as told by your doctor. Ask your doctor about: Taking medicines such as aspirin and ibuprofen. Do not take these medicines unless your doctor tells you to take them. Taking over-the-counter medicines, vitamins, herbs, and supplements. You may be given iron pills. Take them as told by your doctor. Managing constipation If you take iron pills, you may need to take these actions to prevent or treat trouble pooping (constipation): Drink enough fluid to keep your pee (urine) pale yellow. Take over-the-counter or prescription medicines. Eat foods that are high in fiber. These include beans, whole grains, and fresh fruits and vegetables. Limit foods that are high in fat and sugar. These include fried or sweet foods. Activity Change your activity to decrease bleeding if you need to change your sanitary pad more than one time every 2 hours: Lie in bed with your feet raised (elevated). Place a cold pack on your lower belly. Rest as much as you are able until the bleeding stops or slows down. General instructions Do not use tampons, douche, or have sex until your doctor says these things are okay. Change your pads often. Get regular exams. These include: Pelvic exams. Screenings for cancer of the cervix. It is up to you to get the results of any tests that are done. Ask how to get your results when they are  ready. Watch for any changes in your bleeding. For 2 months, write down: When your monthly period starts. When your monthly period ends. When you get any abnormal bleeding from your vagina. What problems you notice. Keep all follow-up visits. Contact a doctor if: The bleeding lasts more than one week. You feel dizzy at times. You feel like you may vomit (nausea). You vomit. You feel light-headed or weak. Your symptoms get worse. Get help right away if: You faint. You have to change pads every hour. You have pain in your belly. You have a fever or chills. You get sweaty or weak. You pass large blood clots from your vagina. These symptoms may be an emergency. Get help right away. Call your local emergency services (911 in the [place] Do not wait to see if the symptoms will go away. Do not drive yourself to the hospital. Summary Abnormal uterine bleeding means bleeding more than normal from your womb (uterus). Any kind of bleeding that is not normal should be checked by a doctor. Treatment depends on the cause of your bleeding and how much you bleed. Get help right away if you faint, you have to change pads every hour, or you pass large blood clots from your vagina. This information is not intended to replace advice given to you by your health care provider. Make sure you discuss any questions you have with your health care provider. Document Revised: 01/04/2021 Document Reviewed: 01/04/2021 Elsevier Patient Education  2023 Elsevier Inc.  

## 2022-08-05 LAB — CBC
Hematocrit: 40.3 % (ref 34.0–46.6)
Hemoglobin: 13.2 g/dL (ref 11.1–15.9)
MCH: 29.7 pg (ref 26.6–33.0)
MCHC: 32.8 g/dL (ref 31.5–35.7)
MCV: 91 fL (ref 79–97)
Platelets: 416 10*3/uL (ref 150–450)
RBC: 4.45 x10E6/uL (ref 3.77–5.28)
RDW: 13.3 % (ref 11.7–15.4)
WBC: 9.7 10*3/uL (ref 3.4–10.8)

## 2022-08-05 LAB — FERRITIN: Ferritin: 39 ng/mL (ref 15–150)

## 2022-08-07 ENCOUNTER — Encounter: Payer: Self-pay | Admitting: Certified Nurse Midwife

## 2022-08-07 ENCOUNTER — Ambulatory Visit (INDEPENDENT_AMBULATORY_CARE_PROVIDER_SITE_OTHER): Payer: BC Managed Care – PPO

## 2022-08-07 DIAGNOSIS — N939 Abnormal uterine and vaginal bleeding, unspecified: Secondary | ICD-10-CM | POA: Diagnosis not present

## 2022-08-14 ENCOUNTER — Encounter: Payer: Self-pay | Admitting: Certified Nurse Midwife

## 2022-08-23 ENCOUNTER — Encounter: Payer: Self-pay | Admitting: Nurse Practitioner

## 2022-08-24 ENCOUNTER — Ambulatory Visit: Payer: BC Managed Care – PPO | Admitting: Obstetrics and Gynecology

## 2022-08-25 ENCOUNTER — Encounter: Payer: Self-pay | Admitting: Obstetrics and Gynecology

## 2022-08-28 ENCOUNTER — Telehealth: Payer: Self-pay | Admitting: *Deleted

## 2022-08-28 NOTE — Telephone Encounter (Signed)
NO openings in the scheduled until 1/18 with Dr. Hulan Fray per protocol patient needs to be seen in 1 to 2 weeks. Please advise?

## 2022-08-28 NOTE — Telephone Encounter (Signed)
I contacted patient via phone. I is scheduled for 12/12 with Dr Hulan Fray at 3:55 pm due to cancellation.

## 2022-08-28 NOTE — Telephone Encounter (Signed)
Pt called back before closing this message and said she did not need the appointment anymore on Friday. Informed patient we would cancel that.    LVM for pt to call office wanted to see if we could get patient to come in on 08/31/2022 at 9:30am for the appointment that is scheduled on 09/01/2022 at 9:50am. Alexandrina Fiorini [place], CMA

## 2022-08-29 ENCOUNTER — Ambulatory Visit (INDEPENDENT_AMBULATORY_CARE_PROVIDER_SITE_OTHER): Payer: Medicaid Other | Admitting: Obstetrics & Gynecology

## 2022-08-29 ENCOUNTER — Other Ambulatory Visit (HOSPITAL_COMMUNITY)
Admission: RE | Admit: 2022-08-29 | Discharge: 2022-08-29 | Disposition: A | Discharge status: Home / Self Care | Payer: Medicaid Other | Source: Ambulatory Visit | Attending: Obstetrics & Gynecology | Admitting: Obstetrics & Gynecology

## 2022-08-29 VITALS — Ht 63.0 in | Wt 238.0 lb

## 2022-08-29 DIAGNOSIS — Z3202 Encounter for pregnancy test, result negative: Secondary | ICD-10-CM

## 2022-08-29 DIAGNOSIS — N939 Abnormal uterine and vaginal bleeding, unspecified: Secondary | ICD-10-CM

## 2022-08-29 LAB — POCT URINE PREGNANCY: Preg Test, Ur: NEGATIVE

## 2022-08-29 NOTE — Progress Notes (Signed)
   Established Patient Office Visit  Subjective   Patient ID: Shannon Foley, female    DOB: April 15, 1986  Age: 36 y.o. MRN: [id number]  Chief Complaint  Patient presents with   Follow-up    AUB follow up and EMB    HPI    36 yo G1P0A1 (spontaneous miscarriage in 2021) here for a EMBX to help evaluate DUB. She tells me that she has bled almost daily since September of this year. She would like a pregnancy and plans to make an appt with a REI. Her ultrasound showed multiple fibroids and a thickened endometrium.  Objective:     Ht '5\' 3"'$  (1.6 m)   Wt 238 lb (108 kg)   LMP 07/10/2022 (Exact Date)   BMI 42.16 kg/m    Physical Exam Well nourished, well hydrated Black female, no apparent distress   UPT negative, consent signed, time out done Cervix prepped with betadine and sprayed with Hurricaine spray. I then grasped with a single tooth tenaculum. Uterus sounded to 9 cm Pipelle used for 2 passes with a very large amount of tissue obtained. She tolerated the procedure well.    Assessment & Plan:   Problem List Items Addressed This Visit   None Visit Diagnoses     Abnormal uterine bleeding    -  Primary   Relevant Orders   Surgical pathology      She will come back next week to discuss the results.   Emily Filbert, MD

## 2022-08-31 LAB — SURGICAL PATHOLOGY

## 2022-09-01 ENCOUNTER — Ambulatory Visit: Payer: BC Managed Care – PPO | Admitting: Nurse Practitioner

## 2022-09-05 ENCOUNTER — Encounter: Payer: Self-pay | Admitting: Obstetrics & Gynecology

## 2022-09-05 ENCOUNTER — Ambulatory Visit (INDEPENDENT_AMBULATORY_CARE_PROVIDER_SITE_OTHER): Payer: Medicaid Other | Admitting: Obstetrics & Gynecology

## 2022-09-05 VITALS — BP 128/88 | Ht 63.0 in | Wt 239.0 lb

## 2022-09-05 DIAGNOSIS — N83202 Unspecified ovarian cyst, left side: Secondary | ICD-10-CM | POA: Diagnosis not present

## 2022-09-05 DIAGNOSIS — N840 Polyp of corpus uteri: Secondary | ICD-10-CM

## 2022-09-05 DIAGNOSIS — N939 Abnormal uterine and vaginal bleeding, unspecified: Secondary | ICD-10-CM

## 2022-09-05 DIAGNOSIS — D219 Benign neoplasm of connective and other soft tissue, unspecified: Secondary | ICD-10-CM

## 2022-09-05 NOTE — Progress Notes (Signed)
   Established Patient Office Visit  Subjective   Patient ID: [name], female    DOB: 1986-09-02  Age: 36 y.o. MRN: [id number]  Chief Complaint  Patient presents with   Follow-up    HPI    36 yo single G1P0A1 here for follow up after gyn ultrasound and EMBX done as part of evaluation of DUB. She has bled almost daily since September 2023. A CBC last month showed HBG of 13.7.  Her ultrasound showed the following: Narrative & Impression  Patient Name: Shannon Foley DOB: 11/17/1985 MRN: [id number] LMP: 07/04/2022   ULTRASOUND REPORT   Location: [facility] Date of Service: 08/07/2022        Indications:Abnormal Uterine Bleeding, h/o fibroids Findings:  The uterus is anteverted and measures 8.9 x 6.9 x 5.1 cm. Echo texture is homogenous with evidence of focal masses. Within the uterus are multiple suspected fibroids. The largest three measuring: Fibroid 1: Posterior Left Fundal, Intramural, 2.9 x 2.6 cm Fibroid 2: Anterior Left, Subserosal, 2.3 x 1.8 cm Fibroid 3: Anterior, Subserosal, 0.9 x 0.6 cm   The Endometrium measures 14.2 mm. - tiny cystic structures throughout, ? Polyp vs endometrial hyperplasia   Right Ovary measures 3.5 x 1.2 x 1.7 cm. It is normal in appearance. Left Ovary measures 4.7 x 4.2 x 3.4 cm. It is normal in appearance. - dominant follicle, 2.7 x 6.7EH Survey of the adnexa demonstrates no adnexal masses. There is no free fluid in the cul de sac.   Impression: 1. Multiple fibroids 2. Possible endometrial polyp vs endometrial hyperplasia 3. Left ovarian dominant follicle     She told me at her last visit that she wants a pregnancy/baby and wants to see a REI but today tells me that she would prefer to have her DUB issues treated here at this practice. She is not ready to make an appt with REI. I did an EMBX that showed a benign polyp and benign endometrial cells.     BP 128/88   Ht '5\' 3"'$  (1.6 m)   Wt 239 lb (108.4 kg)    LMP 08/13/2022   BMI 42.34 kg/m    Physical Exam Well nourished, well hydrated Black female, no apparent distress    Assessment & Plan:  AUB with uterine polyps and fibroids- she will make an appt to see one of the surgeons at this practice.  Problem List Items Addressed This Visit   None   Return for needs to see Dr. Marcelline Mates or Amalia Hailey for possible hysteroscopy.    Emily Filbert, MD

## 2022-09-12 ENCOUNTER — Encounter: Payer: Self-pay | Admitting: Certified Nurse Midwife

## 2022-09-13 ENCOUNTER — Encounter: Payer: Self-pay | Admitting: Nurse Practitioner

## 2022-09-14 ENCOUNTER — Other Ambulatory Visit: Payer: Self-pay | Admitting: Nurse Practitioner

## 2022-09-14 DIAGNOSIS — Z20828 Contact with and (suspected) exposure to other viral communicable diseases: Secondary | ICD-10-CM

## 2022-09-14 DIAGNOSIS — J0140 Acute pansinusitis, unspecified: Secondary | ICD-10-CM

## 2022-09-14 MED ORDER — AZITHROMYCIN 250 MG PO TABS: ORAL_TABLET | ORAL | 0 refills | Status: DC

## 2022-09-14 MED ORDER — OSELTAMIVIR PHOSPHATE 75 MG PO CAPS: 75.0000 mg | ORAL_CAPSULE | Freq: Two times a day (BID) | ORAL | 0 refills | Status: DC

## 2022-09-18 ENCOUNTER — Encounter: Payer: Self-pay | Admitting: Obstetrics and Gynecology

## 2022-09-22 ENCOUNTER — Encounter: Payer: Medicaid Other | Admitting: Obstetrics and Gynecology

## 2022-09-22 DIAGNOSIS — D219 Benign neoplasm of connective and other soft tissue, unspecified: Secondary | ICD-10-CM

## 2022-09-22 DIAGNOSIS — N939 Abnormal uterine and vaginal bleeding, unspecified: Secondary | ICD-10-CM

## 2022-09-25 ENCOUNTER — Ambulatory Visit: Payer: Medicaid Other | Admitting: Nurse Practitioner

## 2022-09-28 ENCOUNTER — Encounter: Payer: Self-pay | Admitting: Nurse Practitioner

## 2022-10-12 ENCOUNTER — Encounter: Payer: BC Managed Care – PPO | Admitting: Nurse Practitioner

## 2022-10-13 ENCOUNTER — Encounter: Payer: Self-pay | Admitting: Obstetrics and Gynecology

## 2022-10-13 ENCOUNTER — Ambulatory Visit (INDEPENDENT_AMBULATORY_CARE_PROVIDER_SITE_OTHER): Payer: Medicaid Other | Admitting: Obstetrics and Gynecology

## 2022-10-13 VITALS — BP 133/86 | HR 74 | Ht 63.0 in | Wt 243.6 lb

## 2022-10-13 DIAGNOSIS — N840 Polyp of corpus uteri: Secondary | ICD-10-CM

## 2022-10-13 DIAGNOSIS — D219 Benign neoplasm of connective and other soft tissue, unspecified: Secondary | ICD-10-CM

## 2022-10-13 DIAGNOSIS — N939 Abnormal uterine and vaginal bleeding, unspecified: Secondary | ICD-10-CM

## 2022-10-13 NOTE — Progress Notes (Signed)
HPI:      [name] is a 37 y.o. G1P0010 who LMP was Patient's last menstrual period was 08/24/2022.  Subjective:   She presents today because she was found to have an endometrial polyp.  She has had irregular bleeding and fibroids and possible polyp was noted by ultrasound.  She underwent an endometrial biopsy which was benign. Additionally, she is attempting pregnancy at this time.    Hx: The following portions of the patient's history were reviewed and updated as appropriate:             She  has a past medical history of Back pain, Chronic abdominal pain, Chronic constipation, Fibroids, Headache, IBS (irritable bowel syndrome), Missed abortion, Motion sickness (cars), Obesity, and Pituitary cyst ([place]). She does not have any pertinent problems on file. She  has a past surgical history that includes Tonsillectomy (2017); Esophagogastroduodenoscopy (egd) with propofol (N/A, 02/18/2016); Cystoscopy (05/10/2016); Tonsillectomy; Colonoscopy; and Dilation and evacuation (N/A, 04/13/2020). Her family history includes Bladder Cancer in her paternal aunt; CVA in her father; Congestive Heart Failure in her mother; Diabetes in her father and mother; Hypertension in her father and mother. She  reports that she has never smoked. She has never used smokeless tobacco. She reports that she does not drink alcohol and does not use drugs. She has a current medication list which includes the following prescription(s): cyanocobalamin, syringe 3cc/22gx1", and vitamin d (ergocalciferol). She has No Known Allergies.       Review of Systems:  Review of Systems  Constitutional: Denied constitutional symptoms, night sweats, recent illness, fatigue, fever, insomnia and weight loss.  Eyes: Denied eye symptoms, eye pain, photophobia, vision change and visual disturbance.  Ears/Nose/Throat/Neck: Denied ear, nose, throat or neck symptoms, hearing loss, nasal discharge, sinus congestion and sore throat.   Cardiovascular: Denied cardiovascular symptoms, arrhythmia, chest pain/pressure, edema, exercise intolerance, orthopnea and palpitations.  Respiratory: Denied pulmonary symptoms, asthma, pleuritic pain, productive sputum, cough, dyspnea and wheezing.  Gastrointestinal: Denied, gastro-esophageal reflux, melena, nausea and vomiting.  Genitourinary: See HPI for additional information.  Musculoskeletal: Denied musculoskeletal symptoms, stiffness, swelling, muscle weakness and myalgia.  Dermatologic: Denied dermatology symptoms, rash and scar.  Neurologic: Denied neurology symptoms, dizziness, headache, neck pain and syncope.  Psychiatric: Denied psychiatric symptoms, anxiety and depression.  Endocrine: Denied endocrine symptoms including hot flashes and night sweats.   Meds:   Current Outpatient Medications on File Prior to Visit  Medication Sig Dispense Refill   cyanocobalamin (VITAMIN B12) 1000 MCG/ML injection Inject once a week for 2 weeks and then 1 Per month ( pt needs syringes with needles as well ) self administered 2 mL 4   Syringe/Needle, Disp, (SYRINGE 3CC/22GX1") 22G X 1" 3 ML MISC 1 Device by Does not apply route every 30 (thirty) days. 10 each 3   Vitamin D, Ergocalciferol, (DRISDOL) 1.25 MG (50000 UNIT) CAPS capsule TAKE 1 CAPSULE BY MOUTH 1 TIME A WEEK 4 capsule 1   No current facility-administered medications on file prior to visit.      Objective:     Vitals:   10/13/22 1028  BP: 133/86  Pulse: 74   Filed Weights   10/13/22 1028  Weight: 243 lb 9.6 oz (110.5 kg)              Ultrasound results reviewed directly with the patient          Assessment:    G1P0010 Patient Active Problem List   Diagnosis Date Noted   Vitamin D  deficiency 07/13/2022   Impaired fasting glucose 07/13/2022   BMI 40.0-44.9, adult ([place]) 06/11/2022   Bacterial vaginitis 08/02/2021   Recurrent pansinusitis 10/08/2020   Abnormal biochemical finding on antenatal screening of mother  04/01/2020   Vaginal candida 03/18/2019   CIN I (cervical intraepithelial neoplasia I) 02/13/2019   Acute otitis media 01/06/2019   Acute upper respiratory infection 12/08/2018   Fever and chills 12/08/2018   Sore throat 12/08/2018   Hiatal hernia 06/23/2018   Left hip pain 06/23/2018   Needs flu shot 06/23/2018   Uterine fibroid 06/13/2018   IBS (irritable bowel syndrome) 05/29/2018   Low grade squamous intraepithelial lesion (LGSIL) on cervical Pap smear 05/27/2018   Encounter for general adult medical examination with abnormal findings 04/03/2018   Gastroesophageal reflux disease without esophagitis 04/03/2018   Chronic midline low back pain without sciatica 04/03/2018   Chronic midline thoracic back pain 04/03/2018   Urinary tract infection without hematuria 04/03/2018   Dysuria 04/03/2018   Chest pain 01/08/2018   Epigastric abdominal pain 01/08/2018   Abnormal findings-gastrointestinal tract    Rathke's cleft cyst ([place]) 02/02/2016   Anemia 04/29/2014   Fatigue 04/29/2014   Hyperprolactinemia ([place]) 04/29/2014   Vitamin B 12 deficiency 04/29/2014   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 Abnormal uterine bleeding   2. Fibroids   3. Abnormal uterine bleeding due to endometrial polyp       Plan:            1.  I have discussed multiple options with the patient and she would like the polyp removed.  We have discussed fibroids and uterine fibroid embolization.  We have also discussed fibroids and their effect on infertility as well as polyps and their effect on infertility.  Patient desires D&C with hysteroscopy.  To schedule  for preop in the near future.  2.  Possible future infertility discussions/workup/treatment. Orders No orders of the defined types were placed in this encounter.   No orders of the defined types were placed in this encounter.     F/U  No follow-ups on file. I spent 24 minutes involved in the care of this patient preparing to see the patient by obtaining and reviewing her medical history (including labs, imaging tests and prior procedures), documenting clinical information in the electronic health record (EHR), counseling and coordinating care plans, writing and sending prescriptions, ordering tests or procedures and in direct communicating with the patient and medical staff discussing pertinent items from her history and physical exam.  Finis Bud, M.D. 10/13/2022 11:19 AM

## 2022-10-13 NOTE — Progress Notes (Signed)
Patient presents today to discuss possible hysteroscopy. She states ongoing irregular cycle since September, skipping months. No additional concerns.

## 2022-10-22 ENCOUNTER — Encounter: Payer: Self-pay | Admitting: Obstetrics and Gynecology

## 2022-10-23 ENCOUNTER — Encounter: Payer: BC Managed Care – PPO | Admitting: Nurse Practitioner

## 2022-10-23 ENCOUNTER — Encounter: Payer: Self-pay | Admitting: Nurse Practitioner

## 2022-10-26 ENCOUNTER — Ambulatory Visit (INDEPENDENT_AMBULATORY_CARE_PROVIDER_SITE_OTHER): Payer: Medicaid Other | Admitting: Nurse Practitioner

## 2022-10-26 ENCOUNTER — Encounter: Payer: Self-pay | Admitting: Nurse Practitioner

## 2022-10-26 VITALS — BP 142/86 | HR 77 | Resp 18 | Ht 63.0 in | Wt 240.0 lb

## 2022-10-26 DIAGNOSIS — M722 Plantar fascial fibromatosis: Secondary | ICD-10-CM | POA: Diagnosis not present

## 2022-10-26 NOTE — Progress Notes (Signed)
Established patient visit   Patient: Shannon Foley   DOB: 09/27/1985   37 y.o. Female  MRN: [id number] Visit Date: 10/26/2022   Chief Complaint  Patient presents with   Plantar Fasciitis   Subjective    HPI  Follow up -works in [employer]  -stands all the time.  -has history of plantar fasciitis.  -needs to have a letter from provider stating that she needs to sit down periodically  -She denies chest pain, chest pressure, or shortness of breath. She denies headaches or visual disturbances. She denies abdominal pain, nausea, vomiting, or changes in bowel or bladder habits.     Medications: Outpatient Medications Prior to Visit  Medication Sig   Vitamin D, Ergocalciferol, (DRISDOL) 1.25 MG (50000 UNIT) CAPS capsule TAKE 1 CAPSULE BY MOUTH 1 TIME A WEEK (Patient taking differently: Take 50,000 Units by mouth every 7 (seven) days. Monday)   [DISCONTINUED] cyanocobalamin (VITAMIN B12) 1000 MCG/ML injection Inject once a week for 2 weeks and then 1 Per month ( pt needs syringes with needles as well ) self administered   [DISCONTINUED] Syringe/Needle, Disp, (SYRINGE 3CC/22GX1") 22G X 1" 3 ML MISC 1 Device by Does not apply route every 30 (thirty) days.   No facility-administered medications prior to visit.    Review of Systems  Musculoskeletal:  Positive for arthralgias.       Bilateral foot pain, most severe along the arches of the feet and into the heels. Thi is most severe whe nshe stands up initially.  Will improve for  a while, then  get worse again as she is on  her feet for long periods of time.   All other systems reviewed and are negative.      Objective     Today's Vitals   10/26/22 1108  BP: (Abnormal) 142/86  Pulse: 77  Resp: 18  SpO2: 99%  Weight: 240 lb (108.9 kg)  Height: 5\' 3"  (1.6 m)   Body mass index is 42.51 kg/m.  BP Readings from Last 3 Encounters:  11/09/22 135/87  10/26/22 (Abnormal) 142/86  10/13/22 133/86    Wt Readings from Last 3  Encounters:  12/01/22 240 lb (108.9 kg)  11/09/22 243 lb 11.2 oz (110.5 kg)  10/26/22 240 lb (108.9 kg)    Physical Exam Vitals and nursing note reviewed.  Constitutional:      Appearance: Normal appearance. She is well-developed. She is obese.  HENT:     Head: Normocephalic and atraumatic.  Eyes:     Pupils: Pupils are equal, round, and reactive to light.  Cardiovascular:     Rate and Rhythm: Normal rate and regular rhythm.     Pulses: Normal pulses.     Heart sounds: Normal heart sounds.  Pulmonary:     Effort: Pulmonary effort is normal.     Breath sounds: Normal breath sounds.  Abdominal:     Palpations: Abdomen is soft.  Musculoskeletal:        General: Normal range of motion.     Cervical back: Normal range of motion and neck supple.       Feet:  Feet:     Comments: Tenderness of both feet with palpation and weight bearing. Mostly along inner arches of both feet. No abnormalities or deformities noted at this time.  Lymphadenopathy:     Cervical: No cervical adenopathy.  Skin:    General: Skin is warm and dry.     Capillary Refill: Capillary refill takes less than 2 seconds.  Neurological:     General: No focal deficit present.     Mental Status: She is alert and oriented to person, place, and time.  Psychiatric:        Mood and Affect: Mood normal.        Behavior: Behavior normal.        Thought Content: Thought content normal.        Judgment: Judgment normal.      Assessment & Plan    1. Bilateral plantar fasciitis Advised patient to take tylenol and/or ibuprofen as needed for pain. Encouraged her to ice the bottoms of her feet after work  to reduce pain and inflammation. A work  note ws provided for the patient, which advises that she should be able  to sit down in increased intervals, or may, in fact,  do her job while sitting down.    Problem List Items Addressed This Visit       Musculoskeletal and Integument   Bilateral plantar fasciitis - Primary      Return for prn worsening or persistent symptoms.         Ronnell Freshwater, NP  [facility] [phone] (phone) [phone] (fax)  [place]

## 2022-11-09 ENCOUNTER — Encounter: Payer: Self-pay | Admitting: Obstetrics and Gynecology

## 2022-11-09 ENCOUNTER — Ambulatory Visit (INDEPENDENT_AMBULATORY_CARE_PROVIDER_SITE_OTHER): Payer: Medicaid Other | Admitting: Obstetrics and Gynecology

## 2022-11-09 VITALS — BP 135/87 | HR 87 | Ht 63.0 in | Wt 243.7 lb

## 2022-11-09 DIAGNOSIS — D219 Benign neoplasm of connective and other soft tissue, unspecified: Secondary | ICD-10-CM

## 2022-11-09 DIAGNOSIS — N939 Abnormal uterine and vaginal bleeding, unspecified: Secondary | ICD-10-CM

## 2022-11-09 DIAGNOSIS — N840 Polyp of corpus uteri: Secondary | ICD-10-CM | POA: Diagnosis not present

## 2022-11-09 DIAGNOSIS — D259 Leiomyoma of uterus, unspecified: Secondary | ICD-10-CM | POA: Diagnosis not present

## 2022-11-09 DIAGNOSIS — Z01818 Encounter for other preprocedural examination: Secondary | ICD-10-CM | POA: Diagnosis not present

## 2022-11-09 NOTE — Progress Notes (Signed)
PRE-OPERATIVE HISTORY AND PHYSICAL EXAM  PCP:  Ronnell Freshwater, NP Subjective:   HPI:  Shannon Foley is a 37 y.o. G1P0010.  Patient's last menstrual period was 10/22/2022.  She presents today for a pre-op discussion and PE.  She has the following symptoms: Dysfunctional uterine bleeding, endometrial polyp, infertility  Review of Systems:   Constitutional: Denied constitutional symptoms, night sweats, recent illness, fatigue, fever, insomnia and weight loss.  Eyes: Denied eye symptoms, eye pain, photophobia, vision change and visual disturbance.  Ears/Nose/Throat/Neck: Denied ear, nose, throat or neck symptoms, hearing loss, nasal discharge, sinus congestion and sore throat.  Cardiovascular: Denied cardiovascular symptoms, arrhythmia, chest pain/pressure, edema, exercise intolerance, orthopnea and palpitations.  Respiratory: Denied pulmonary symptoms, asthma, pleuritic pain, productive sputum, cough, dyspnea and wheezing.  Gastrointestinal: Denied, gastro-esophageal reflux, melena, nausea and vomiting.  Genitourinary: See HPI for additional information.  Musculoskeletal: Denied musculoskeletal symptoms, stiffness, swelling, muscle weakness and myalgia.  Dermatologic: Denied dermatology symptoms, rash and scar.  Neurologic: Denied neurology symptoms, dizziness, headache, neck pain and syncope.  Psychiatric: Denied psychiatric symptoms, anxiety and depression.  Endocrine: Denied endocrine symptoms including hot flashes and night sweats.   OB History  Gravida Para Term Preterm AB Living  1 0 0 0 1 0  SAB IAB Ectopic Multiple Live Births  1 0 0 0 0    # Outcome Date GA Lbr Len/2nd Weight Sex Delivery Anes PTL Lv  1 SAB             Past Medical History:  Diagnosis Date   Back pain    mid and lower back   Chronic abdominal pain    Chronic constipation    Fibroids    one submucosal and one intramural (both  < 1 inch in size)   Headache    caused by pituitary cyst    IBS (irritable bowel syndrome)    Missed abortion    Motion sickness cars   Obesity    Pituitary cyst ([place])    Rathke's pouch cyst    Past Surgical History:  Procedure Laterality Date   COLONOSCOPY     CYSTOSCOPY  05/10/2016   WNL   DILATION AND EVACUATION N/A 04/13/2020   Procedure: SUCTION DILATION AND CURETTAGE;  Surgeon: [name], MD;  Location: [facility];  Service: Gynecology;  Laterality: N/A;   ESOPHAGOGASTRODUODENOSCOPY (EGD) WITH PROPOFOL N/A 02/18/2016   Procedure: ESOPHAGOGASTRODUODENOSCOPY (EGD) WITH PROPOFOL;  Surgeon: Lucilla Lame, MD;  Location: [place];  Service: Endoscopy;  Laterality: N/A;   TONSILLECTOMY  2017   TONSILLECTOMY        SOCIAL HISTORY:  Social History   Tobacco Use  Smoking Status Never   Passive exposure: Never  Smokeless Tobacco Never   Social History   Substance and Sexual Activity  Alcohol Use No    Social History   Substance and Sexual Activity  Drug Use No    Family History  Problem Relation Age of Onset   Diabetes Mother    Hypertension Mother    Congestive Heart Failure Mother    CVA Father        Had meningioma   Diabetes Father    Hypertension Father    Bladder Cancer Paternal Aunt    Colon cancer Neg Hx    Esophageal cancer Neg Hx    Stomach cancer Neg Hx    Rectal cancer Neg Hx     ALLERGIES:  Patient has no known allergies.  MEDS:   Current Outpatient Medications on File Prior to Visit  Medication Sig Dispense Refill   cyanocobalamin (VITAMIN B12) 1000 MCG/ML injection Inject once a week for 2 weeks and then 1 Per month ( pt needs syringes with needles as well ) self administered 2 mL 4   Syringe/Needle, Disp, (SYRINGE 3CC/22GX1") 22G X 1" 3 ML MISC 1 Device by Does not apply route every 30 (thirty) days. 10 each 3   Vitamin D, Ergocalciferol, (DRISDOL) 1.25 MG (50000 UNIT) CAPS capsule TAKE 1 CAPSULE BY MOUTH 1 TIME A WEEK 4 capsule 1   No current facility-administered medications on file  prior to visit.    No orders of the defined types were placed in this encounter.    Physical examination BP 135/87   Pulse 87   Ht 5' 3"$  (1.6 m)   Wt 243 lb 11.2 oz (110.5 kg)   LMP 10/22/2022   BMI 43.17 kg/m   General NAD, Conversant  HEENT Atraumatic; Op clear with mmm.  Normo-cephalic. Pupils reactive. Anicteric sclerae  Thyroid/Neck Smooth without nodularity or enlargement. Normal ROM.  Neck Supple.  Skin No rashes, lesions or ulceration. Normal palpated skin turgor. No nodularity.  Breasts: No masses or discharge.  Symmetric.  No axillary adenopathy.  Lungs: Clear to auscultation.No rales or wheezes. Normal Respiratory effort, no retractions.  Heart: NSR.  No murmurs or rubs appreciated. No peripheral edema  Abdomen: Soft.  Non-tender.  No masses.  No HSM. No hernia  Extremities: Moves all appropriately.  Normal ROM for age. No lymphadenopathy.  Neuro: Oriented to PPT.  Normal mood. Normal affect.     Pelvic: Deferred to the OR  -please see ultrasound report   Assessment:   G1P0010 Patient Active Problem List   Diagnosis Date Noted   Vitamin D deficiency 07/13/2022   Impaired fasting glucose 07/13/2022   BMI 40.0-44.9, adult ([place]) 06/11/2022   Bacterial vaginitis 08/02/2021   Recurrent pansinusitis 10/08/2020   Abnormal biochemical finding on antenatal screening of mother 04/01/2020   Vaginal candida 03/18/2019   CIN I (cervical intraepithelial neoplasia I) 02/13/2019   Acute otitis media 01/06/2019   Acute upper respiratory infection 12/08/2018   Fever and chills 12/08/2018   Sore throat 12/08/2018   Hiatal hernia 06/23/2018   Left hip pain 06/23/2018   Needs flu shot 06/23/2018   Uterine fibroid 06/13/2018   IBS (irritable bowel syndrome) 05/29/2018   Low grade squamous intraepithelial lesion (LGSIL) on cervical Pap smear 05/27/2018   Encounter for general adult medical examination with abnormal findings 04/03/2018   Gastroesophageal reflux disease without  esophagitis 04/03/2018   Chronic midline low back pain without sciatica 04/03/2018   Chronic midline thoracic back pain 04/03/2018   Urinary tract infection without hematuria 04/03/2018   Dysuria 04/03/2018   Chest pain 01/08/2018   Epigastric abdominal pain 01/08/2018   Abnormal findings-gastrointestinal tract    Rathke's cleft cyst ([place]) 02/02/2016   Anemia 04/29/2014   Fatigue 04/29/2014   Hyperprolactinemia ([place]) 04/29/2014   Vitamin B 12 deficiency 04/29/2014   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 Pre-op exam   2. Fibroids   3. Abnormal uterine bleeding due  to endometrial polyp      Plan:   Orders: No orders of the defined types were placed in this encounter.    1.  Hysteroscopy D&C  Pre-op discussions regarding Risks and Benefits of her scheduled surgery.  D&C The procedure and the risks and benefits of dilation and curettage/evacuation have been explained to the patient.  The specific risks of bleeding, infection, anesthesia, uterine perforation, and damage to bowel or bladder  have been specifically discussed.  I have answered all of her questions and I believe that she has an adequate and informed understanding of this procedure.    I spent 33 minutes involved in the care of this patient preparing to see the patient by obtaining and reviewing her medical history (including labs, imaging tests and prior procedures), documenting clinical information in the electronic health record (EHR), counseling and coordinating care plans, writing and sending prescriptions, ordering tests or procedures and in direct communicating with  the patient and medical staff discussing pertinent items from her history and physical exam.  Finis Bud, M.D. 11/09/2022 10:26 AM

## 2022-11-09 NOTE — H&P (Signed)
PRE-OPERATIVE HISTORY AND PHYSICAL EXAM  PCP:  Ronnell Freshwater, NP Subjective:   HPI:  Shannon Foley is a 37 y.o. G1P0010.  Patient's last menstrual period was 10/22/2022.  She presents today for a pre-op discussion and PE.  She has the following symptoms: Dysfunctional uterine bleeding, endometrial polyp, infertility  Review of Systems:   Constitutional: Denied constitutional symptoms, night sweats, recent illness, fatigue, fever, insomnia and weight loss.  Eyes: Denied eye symptoms, eye pain, photophobia, vision change and visual disturbance.  Ears/Nose/Throat/Neck: Denied ear, nose, throat or neck symptoms, hearing loss, nasal discharge, sinus congestion and sore throat.  Cardiovascular: Denied cardiovascular symptoms, arrhythmia, chest pain/pressure, edema, exercise intolerance, orthopnea and palpitations.  Respiratory: Denied pulmonary symptoms, asthma, pleuritic pain, productive sputum, cough, dyspnea and wheezing.  Gastrointestinal: Denied, gastro-esophageal reflux, melena, nausea and vomiting.  Genitourinary: See HPI for additional information.  Musculoskeletal: Denied musculoskeletal symptoms, stiffness, swelling, muscle weakness and myalgia.  Dermatologic: Denied dermatology symptoms, rash and scar.  Neurologic: Denied neurology symptoms, dizziness, headache, neck pain and syncope.  Psychiatric: Denied psychiatric symptoms, anxiety and depression.  Endocrine: Denied endocrine symptoms including hot flashes and night sweats.   OB History  Gravida Para Term Preterm AB Living  1 0 0 0 1 0  SAB IAB Ectopic Multiple Live Births  1 0 0 0 0    # Outcome Date GA Lbr Len/2nd Weight Sex Delivery Anes PTL Lv  1 SAB             Past Medical History:  Diagnosis Date   Back pain    mid and lower back   Chronic abdominal pain    Chronic constipation    Fibroids    one submucosal and one intramural (both  < 1 inch in size)   Headache    caused by pituitary cyst    IBS (irritable bowel syndrome)    Missed abortion    Motion sickness cars   Obesity    Pituitary cyst ([place])    Rathke's pouch cyst    Past Surgical History:  Procedure Laterality Date   COLONOSCOPY     CYSTOSCOPY  05/10/2016   WNL   DILATION AND EVACUATION N/A 04/13/2020   Procedure: SUCTION DILATION AND CURETTAGE;  Surgeon: [name], MD;  Location: [facility];  Service: Gynecology;  Laterality: N/A;   ESOPHAGOGASTRODUODENOSCOPY (EGD) WITH PROPOFOL N/A 02/18/2016   Procedure: ESOPHAGOGASTRODUODENOSCOPY (EGD) WITH PROPOFOL;  Surgeon: Lucilla Lame, MD;  Location: [place];  Service: Endoscopy;  Laterality: N/A;   TONSILLECTOMY  2017   TONSILLECTOMY        SOCIAL HISTORY:  Social History   Tobacco Use  Smoking Status Never   Passive exposure: Never  Smokeless Tobacco Never   Social History   Substance and Sexual Activity  Alcohol Use No    Social History   Substance and Sexual Activity  Drug Use No    Family History  Problem Relation Age of Onset   Diabetes Mother    Hypertension Mother    Congestive Heart Failure Mother    CVA Father        Had meningioma   Diabetes Father    Hypertension Father    Bladder Cancer Paternal Aunt    Colon cancer Neg Hx    Esophageal cancer Neg Hx    Stomach cancer Neg Hx    Rectal cancer Neg Hx     ALLERGIES:  Patient has no known allergies.  MEDS:   Current Outpatient Medications on File Prior to Visit  Medication Sig Dispense Refill   cyanocobalamin (VITAMIN B12) 1000 MCG/ML injection Inject once a week for 2 weeks and then 1 Per month ( pt needs syringes with needles as well ) self administered 2 mL 4   Syringe/Needle, Disp, (SYRINGE 3CC/22GX1") 22G X 1" 3 ML MISC 1 Device by Does not apply route every 30 (thirty) days. 10 each 3   Vitamin D, Ergocalciferol, (DRISDOL) 1.25 MG (50000 UNIT) CAPS capsule TAKE 1 CAPSULE BY MOUTH 1 TIME A WEEK 4 capsule 1   No current facility-administered medications on file  prior to visit.    No orders of the defined types were placed in this encounter.    Physical examination BP 135/87   Pulse 87   Ht 5' 3"$  (1.6 m)   Wt 243 lb 11.2 oz (110.5 kg)   LMP 10/22/2022   BMI 43.17 kg/m   General NAD, Conversant  HEENT Atraumatic; Op clear with mmm.  Normo-cephalic. Pupils reactive. Anicteric sclerae  Thyroid/Neck Smooth without nodularity or enlargement. Normal ROM.  Neck Supple.  Skin No rashes, lesions or ulceration. Normal palpated skin turgor. No nodularity.  Breasts: No masses or discharge.  Symmetric.  No axillary adenopathy.  Lungs: Clear to auscultation.No rales or wheezes. Normal Respiratory effort, no retractions.  Heart: NSR.  No murmurs or rubs appreciated. No peripheral edema  Abdomen: Soft.  Non-tender.  No masses.  No HSM. No hernia  Extremities: Moves all appropriately.  Normal ROM for age. No lymphadenopathy.  Neuro: Oriented to PPT.  Normal mood. Normal affect.     Pelvic: Deferred to the OR  -please see ultrasound report   Assessment:   G1P0010 Patient Active Problem List   Diagnosis Date Noted   Vitamin D deficiency 07/13/2022   Impaired fasting glucose 07/13/2022   BMI 40.0-44.9, adult ([place]) 06/11/2022   Bacterial vaginitis 08/02/2021   Recurrent pansinusitis 10/08/2020   Abnormal biochemical finding on antenatal screening of mother 04/01/2020   Vaginal candida 03/18/2019   CIN I (cervical intraepithelial neoplasia I) 02/13/2019   Acute otitis media 01/06/2019   Acute upper respiratory infection 12/08/2018   Fever and chills 12/08/2018   Sore throat 12/08/2018   Hiatal hernia 06/23/2018   Left hip pain 06/23/2018   Needs flu shot 06/23/2018   Uterine fibroid 06/13/2018   IBS (irritable bowel syndrome) 05/29/2018   Low grade squamous intraepithelial lesion (LGSIL) on cervical Pap smear 05/27/2018   Encounter for general adult medical examination with abnormal findings 04/03/2018   Gastroesophageal reflux disease without  esophagitis 04/03/2018   Chronic midline low back pain without sciatica 04/03/2018   Chronic midline thoracic back pain 04/03/2018   Urinary tract infection without hematuria 04/03/2018   Dysuria 04/03/2018   Chest pain 01/08/2018   Epigastric abdominal pain 01/08/2018   Abnormal findings-gastrointestinal tract    Rathke's cleft cyst ([place]) 02/02/2016   Anemia 04/29/2014   Fatigue 04/29/2014   Hyperprolactinemia ([place]) 04/29/2014   Vitamin B 12 deficiency 04/29/2014   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 Pre-op exam   2. Fibroids   3. Abnormal uterine bleeding due  to endometrial polyp      Plan:   Orders: No orders of the defined types were placed in this encounter.    1.  Hysteroscopy D&C

## 2022-11-09 NOTE — Progress Notes (Signed)
Patient presents today for a pre-op exam. She states no additional concerns today.

## 2022-11-29 ENCOUNTER — Encounter: Payer: Self-pay | Admitting: Obstetrics and Gynecology

## 2022-12-01 ENCOUNTER — Encounter
Admission: RE | Admit: 2022-12-01 | Discharge: 2022-12-01 | Disposition: A | Discharge status: Home / Self Care | Payer: Medicaid Other | Source: Ambulatory Visit | Attending: Obstetrics and Gynecology | Admitting: Obstetrics and Gynecology

## 2022-12-01 VITALS — Ht 63.0 in | Wt 240.0 lb

## 2022-12-01 DIAGNOSIS — N840 Polyp of corpus uteri: Secondary | ICD-10-CM

## 2022-12-01 DIAGNOSIS — E236 Other disorders of pituitary gland: Secondary | ICD-10-CM

## 2022-12-01 DIAGNOSIS — Z01812 Encounter for preprocedural laboratory examination: Secondary | ICD-10-CM

## 2022-12-01 HISTORY — DX: Body Mass Index (BMI) 40.0 and over, adult: Z684

## 2022-12-01 HISTORY — DX: Polyp of corpus uteri: N84.0

## 2022-12-01 HISTORY — DX: Vitamin D deficiency, unspecified: E55.9

## 2022-12-01 HISTORY — DX: Deficiency of other specified B group vitamins: E53.8

## 2022-12-01 HISTORY — DX: Morbid (severe) obesity due to excess calories: E66.01

## 2022-12-01 NOTE — Patient Instructions (Addendum)
Your procedure is scheduled on: Monday, March 25 Report to the Registration Desk on the 1st floor of the [facility]. To find out your arrival time, please call [phone] between 1PM - 3PM on: Friday, March 22 If your arrival time is 6:00 am, do not arrive before that time as the [facility] entrance doors do not open until 6:00 am.  REMEMBER: Instructions that are not followed completely may result in serious medical risk, up to and including death; or upon the discretion of your surgeon and anesthesiologist your surgery may need to be rescheduled.  Do not eat or drink after midnight the night before surgery.  No gum chewing or hard candies.  One week prior to surgery: starting March 18 Stop Anti-inflammatories (NSAIDS) such as Advil, Aleve, Ibuprofen, Motrin, Naproxen, Naprosyn and Aspirin based products such as Excedrin, Goody's Powder, BC Powder. Stop ANY OVER THE COUNTER supplements until after surgery. You may however, continue to take Tylenol if needed for pain up until the day of surgery.  DO NOT TAKE ANY MEDICATIONS THE MORNING OF SURGERY  No Alcohol for 24 hours before or after surgery.  No Smoking including e-cigarettes for 24 hours before surgery.  No chewable tobacco products for at least 6 hours before surgery.  No nicotine patches on the day of surgery.  Do not use any "recreational" drugs for at least a week (preferably 2 weeks) before your surgery.  Please be advised that the combination of cocaine and anesthesia may have negative outcomes, up to and including death. If you test positive for cocaine, your surgery will be cancelled.  On the morning of surgery brush your teeth with toothpaste and water, you may rinse your mouth with mouthwash if you wish. Do not swallow any toothpaste or mouthwash.  Do not wear jewelry, make-up, hairpins, clips or nail polish.  Do not wear lotions, powders, or perfumes.   Do not shave body hair from the neck down 48 hours  before surgery.  Contact lenses, hearing aids and dentures may not be worn into surgery.  Do not bring valuables to the hospital. [facility] is not responsible for any missing/lost belongings or valuables.   Notify your doctor if there is any change in your medical condition (cold, fever, infection).  Wear comfortable clothing (specific to your surgery type) to the hospital.  After surgery, you can help prevent lung complications by doing breathing exercises.  Take deep breaths and cough every 1-2 hours. Your doctor may order a device called an Incentive Spirometer to help you take deep breaths.  If you are being discharged the day of surgery, you will not be allowed to drive home. You will need a responsible individual to drive you home and stay with you for 24 hours after surgery.   If you are taking public transportation, you will need to have a responsible individual with you.  Please call the [place] [facility] Dept. at [phone] if you have any questions about these instructions.  Surgery Visitation Policy:  Patients undergoing a surgery or procedure may have two family members or support persons with them as long as the person is not COVID-19 positive or experiencing its symptoms.

## 2022-12-06 ENCOUNTER — Encounter
Admission: RE | Admit: 2022-12-06 | Discharge: 2022-12-06 | Disposition: A | Discharge status: Home / Self Care | Payer: Medicaid Other | Source: Ambulatory Visit | Attending: Obstetrics and Gynecology | Admitting: Obstetrics and Gynecology

## 2022-12-06 DIAGNOSIS — N840 Polyp of corpus uteri: Secondary | ICD-10-CM | POA: Diagnosis not present

## 2022-12-06 DIAGNOSIS — E236 Other disorders of pituitary gland: Secondary | ICD-10-CM

## 2022-12-06 DIAGNOSIS — Z01812 Encounter for preprocedural laboratory examination: Secondary | ICD-10-CM | POA: Insufficient documentation

## 2022-12-06 DIAGNOSIS — N939 Abnormal uterine and vaginal bleeding, unspecified: Secondary | ICD-10-CM | POA: Insufficient documentation

## 2022-12-06 DIAGNOSIS — Z6841 Body Mass Index (BMI) 40.0 and over, adult: Secondary | ICD-10-CM | POA: Insufficient documentation

## 2022-12-06 LAB — TYPE AND SCREEN
ABO/RH(D): O POS
Antibody Screen: NEGATIVE

## 2022-12-06 LAB — BASIC METABOLIC PANEL
Anion gap: 9 (ref 5–15)
BUN: 14 mg/dL (ref 6–20)
CO2: 22 mmol/L (ref 22–32)
Calcium: 8.6 mg/dL — ABNORMAL LOW (ref 8.9–10.3)
Chloride: 106 mmol/L (ref 98–111)
Creatinine, Ser: 0.78 mg/dL (ref 0.44–1.00)
GFR, Estimated: >60 mL/min (ref >60)
Glucose, Bld: 90 mg/dL (ref 70–99)
Potassium: 3.6 mmol/L (ref 3.5–5.1)
Sodium: 137 mmol/L (ref 135–145)

## 2022-12-06 LAB — CBC
HCT: 35.5 % — ABNORMAL LOW (ref 36.0–46.0)
Hemoglobin: 11.7 g/dL — ABNORMAL LOW (ref 12.0–15.0)
MCH: 28.3 pg (ref 26.0–34.0)
MCHC: 33 g/dL (ref 30.0–36.0)
MCV: 85.7 fL (ref 80.0–100.0)
Platelets: 542 10*3/uL — ABNORMAL HIGH (ref 150–400)
RBC: 4.14 MIL/uL (ref 3.87–5.11)
RDW: 14.3 % (ref 11.5–15.5)
WBC: 11.5 10*3/uL — ABNORMAL HIGH (ref 4.0–10.5)
nRBC: 0 % (ref 0.0–0.2)

## 2022-12-07 ENCOUNTER — Encounter: Payer: Self-pay | Admitting: Obstetrics and Gynecology

## 2022-12-09 NOTE — Anesthesia Preprocedure Evaluation (Signed)
Anesthesia Evaluation  Patient identified by MRN, date of birth, ID band Patient awake    Reviewed: Allergy & Precautions, NPO status , Patient's Chart, lab work & pertinent test results  History of Anesthesia Complications Negative for: history of anesthetic complications  Airway Mallampati: III   Neck ROM: Full    Dental   Veneers :   Pulmonary neg pulmonary ROS   Pulmonary exam normal breath sounds clear to auscultation       Cardiovascular Exercise Tolerance: Good negative cardio ROS Normal cardiovascular exam Rhythm:Regular Rate:Normal     Neuro/Psych  Headaches (2/2 pituitary cyst) Chronic pain    GI/Hepatic hiatal hernia,GERD  ,,  Endo/Other  Class 3 obesity  Renal/GU negative Renal ROS     Musculoskeletal   Abdominal   Peds  Hematology  (+) Blood dyscrasia, anemia   Anesthesia Other Findings   Reproductive/Obstetrics Fibroids                              Anesthesia Physical Anesthesia Plan  ASA: 3  Anesthesia Plan: General   Post-op Pain Management:    Induction: Intravenous  PONV Risk Score and Plan: 3 and Ondansetron, Dexamethasone and Treatment may vary due to age or medical condition  Airway Management Planned: LMA  Additional Equipment:   Intra-op Plan:   Post-operative Plan: Extubation in OR  Informed Consent: I have reviewed the patients History and Physical, chart, labs and discussed the procedure including the risks, benefits and alternatives for the proposed anesthesia with the patient or authorized representative who has indicated his/her understanding and acceptance.     Dental advisory given  Plan Discussed with: CRNA  Anesthesia Plan Comments: (Patient consented for risks of anesthesia including but not limited to:  - adverse reactions to medications - damage to eyes, teeth, lips or other oral mucosa - nerve damage due to positioning  - sore  throat or hoarseness - damage to heart, brain, nerves, lungs, other parts of body or loss of life  Informed patient about role of CRNA in peri- and intra-operative care.  Patient voiced understanding.)        Anesthesia Quick Evaluation

## 2022-12-11 ENCOUNTER — Ambulatory Visit
Admission: RE | Admit: 2022-12-11 | Discharge: 2022-12-11 | Disposition: A | Discharge status: Home / Self Care | Payer: Medicaid Other | Attending: Obstetrics and Gynecology | Admitting: Obstetrics and Gynecology

## 2022-12-11 ENCOUNTER — Other Ambulatory Visit: Payer: Self-pay

## 2022-12-11 ENCOUNTER — Encounter: Payer: Self-pay | Admitting: Obstetrics and Gynecology

## 2022-12-11 ENCOUNTER — Ambulatory Visit: Payer: Medicaid Other | Admitting: Urgent Care

## 2022-12-11 ENCOUNTER — Ambulatory Visit: Payer: Medicaid Other | Admitting: Anesthesiology

## 2022-12-11 ENCOUNTER — Encounter
Admission: RE | Disposition: A | Discharge status: Home / Self Care | Payer: Self-pay | Source: Home / Self Care | Attending: Obstetrics and Gynecology

## 2022-12-11 DIAGNOSIS — R9389 Abnormal findings on diagnostic imaging of other specified body structures: Secondary | ICD-10-CM | POA: Diagnosis not present

## 2022-12-11 DIAGNOSIS — D759 Disease of blood and blood-forming organs, unspecified: Secondary | ICD-10-CM | POA: Diagnosis not present

## 2022-12-11 DIAGNOSIS — Z6841 Body Mass Index (BMI) 40.0 and over, adult: Secondary | ICD-10-CM | POA: Diagnosis not present

## 2022-12-11 DIAGNOSIS — D649 Anemia, unspecified: Secondary | ICD-10-CM | POA: Diagnosis not present

## 2022-12-11 DIAGNOSIS — Z01812 Encounter for preprocedural laboratory examination: Secondary | ICD-10-CM

## 2022-12-11 DIAGNOSIS — N840 Polyp of corpus uteri: Secondary | ICD-10-CM

## 2022-12-11 HISTORY — PX: HYSTEROSCOPY WITH D & C: SHX1775

## 2022-12-11 LAB — POCT PREGNANCY, URINE: Preg Test, Ur: NEGATIVE

## 2022-12-11 SURGERY — DILATATION AND CURETTAGE /HYSTEROSCOPY
Anesthesia: General | Site: Vagina

## 2022-12-11 MED ORDER — FENTANYL CITRATE (PF) 100 MCG/2ML IJ SOLN
INTRAMUSCULAR | Status: AC
  Filled 2022-12-11: qty 2

## 2022-12-11 MED ORDER — ONDANSETRON HCL 4 MG/2ML IJ SOLN: 4.0000 mg | Freq: Once | INTRAMUSCULAR | Status: DC | PRN

## 2022-12-11 MED ORDER — ORAL CARE MOUTH RINSE: 15.0000 mL | Freq: Once | OROMUCOSAL | Status: AC

## 2022-12-11 MED ORDER — KETOROLAC TROMETHAMINE 30 MG/ML IJ SOLN
INTRAMUSCULAR | Status: DC | PRN
  Administered 2022-12-11: 30 mg via INTRAVENOUS

## 2022-12-11 MED ORDER — PROPOFOL 10 MG/ML IV BOLUS
INTRAVENOUS | Status: AC
  Filled 2022-12-11: qty 40

## 2022-12-11 MED ORDER — LACTATED RINGERS IV SOLN: INTRAVENOUS | Status: DC

## 2022-12-11 MED ORDER — CHLORHEXIDINE GLUCONATE 0.12 % MT SOLN
15.0000 mL | Freq: Once | OROMUCOSAL | Status: AC
  Administered 2022-12-11: 15 mL via OROMUCOSAL

## 2022-12-11 MED ORDER — ONDANSETRON HCL 4 MG/2ML IJ SOLN
INTRAMUSCULAR | Status: DC | PRN
  Administered 2022-12-11: 4 mg via INTRAVENOUS

## 2022-12-11 MED ORDER — LIDOCAINE HCL (PF) 2 % IJ SOLN
INTRAMUSCULAR | Status: AC
  Filled 2022-12-11: qty 5

## 2022-12-11 MED ORDER — POVIDONE-IODINE 10 % EX SWAB: 2.0000 | Freq: Once | CUTANEOUS | Status: DC

## 2022-12-11 MED ORDER — ACETAMINOPHEN 10 MG/ML IV SOLN
INTRAVENOUS | Status: AC
  Filled 2022-12-11: qty 100

## 2022-12-11 MED ORDER — SILVER NITRATE-POT NITRATE 75-25 % EX MISC
CUTANEOUS | Status: AC
  Filled 2022-12-11: qty 10

## 2022-12-11 MED ORDER — HYDROCODONE-ACETAMINOPHEN 5-325 MG PO TABS: 1.0000 | ORAL_TABLET | Freq: Four times a day (QID) | ORAL | 0 refills | Status: DC | PRN

## 2022-12-11 MED ORDER — DEXAMETHASONE SODIUM PHOSPHATE 10 MG/ML IJ SOLN
INTRAMUSCULAR | Status: DC | PRN
  Administered 2022-12-11: 10 mg via INTRAVENOUS

## 2022-12-11 MED ORDER — SUCCINYLCHOLINE CHLORIDE 200 MG/10ML IV SOSY
PREFILLED_SYRINGE | INTRAVENOUS | Status: AC
  Filled 2022-12-11: qty 10

## 2022-12-11 MED ORDER — PROPOFOL 10 MG/ML IV BOLUS
INTRAVENOUS | Status: DC | PRN
  Administered 2022-12-11: 200 mg via INTRAVENOUS

## 2022-12-11 MED ORDER — LIDOCAINE HCL (CARDIAC) PF 100 MG/5ML IV SOSY
PREFILLED_SYRINGE | INTRAVENOUS | Status: DC | PRN
  Administered 2022-12-11: 100 mg via INTRAVENOUS

## 2022-12-11 MED ORDER — IBUPROFEN 600 MG PO TABS: 600.0000 mg | ORAL_TABLET | Freq: Four times a day (QID) | ORAL | 0 refills | Status: DC | PRN

## 2022-12-11 MED ORDER — ACETAMINOPHEN 10 MG/ML IV SOLN
INTRAVENOUS | Status: DC | PRN
  Administered 2022-12-11: 1000 mg via INTRAVENOUS

## 2022-12-11 MED ORDER — CHLORHEXIDINE GLUCONATE 0.12 % MT SOLN
OROMUCOSAL | Status: AC
  Filled 2022-12-11: qty 15

## 2022-12-11 MED ORDER — OXYCODONE HCL 5 MG PO TABS
ORAL_TABLET | ORAL | Status: AC
  Filled 2022-12-11: qty 1

## 2022-12-11 MED ORDER — FENTANYL CITRATE (PF) 100 MCG/2ML IJ SOLN
INTRAMUSCULAR | Status: DC | PRN
  Administered 2022-12-11 (×4): 25 ug via INTRAVENOUS

## 2022-12-11 MED ORDER — FENTANYL CITRATE (PF) 100 MCG/2ML IJ SOLN: 25.0000 ug | INTRAMUSCULAR | Status: DC | PRN

## 2022-12-11 MED ORDER — OXYCODONE HCL 5 MG/5ML PO SOLN: 5.0000 mg | Freq: Once | ORAL | Status: AC | PRN

## 2022-12-11 MED ORDER — FAMOTIDINE 20 MG PO TABS
20.0000 mg | ORAL_TABLET | Freq: Once | ORAL | Status: AC
  Administered 2022-12-11: 20 mg via ORAL

## 2022-12-11 MED ORDER — PHENYLEPHRINE HCL (PRESSORS) 10 MG/ML IV SOLN
INTRAVENOUS | Status: DC | PRN
  Administered 2022-12-11 (×2): 80 ug via INTRAVENOUS

## 2022-12-11 MED ORDER — FAMOTIDINE 20 MG PO TABS
ORAL_TABLET | ORAL | Status: AC
  Filled 2022-12-11: qty 1

## 2022-12-11 MED ORDER — MIDAZOLAM HCL 2 MG/2ML IJ SOLN
INTRAMUSCULAR | Status: AC
  Filled 2022-12-11: qty 2

## 2022-12-11 MED ORDER — ACETAMINOPHEN 10 MG/ML IV SOLN: 1000.0000 mg | Freq: Once | INTRAVENOUS | Status: DC | PRN

## 2022-12-11 MED ORDER — MIDAZOLAM HCL 2 MG/2ML IJ SOLN
INTRAMUSCULAR | Status: DC | PRN
  Administered 2022-12-11: 2 mg via INTRAVENOUS

## 2022-12-11 MED ORDER — OXYCODONE HCL 5 MG PO TABS
5.0000 mg | ORAL_TABLET | Freq: Once | ORAL | Status: AC | PRN
  Administered 2022-12-11: 5 mg via ORAL

## 2022-12-11 SURGICAL SUPPLY — 18 items
DRSG TELFA 3X8 NADH STRL (GAUZE/BANDAGES/DRESSINGS) ×1 IMPLANT
GLOVE PI ORTHO PRO STRL 7.5 (GLOVE) ×1 IMPLANT
GOWN STRL REUS W/ TWL LRG LVL3 (GOWN DISPOSABLE) ×1 IMPLANT
GOWN STRL REUS W/TWL LRG LVL3 (GOWN DISPOSABLE) ×1
IV NS IRRIG 3000ML ARTHROMATIC (IV SOLUTION) ×1 IMPLANT
KIT PROCEDURE FLUENT (KITS) ×1 IMPLANT
KIT TURNOVER CYSTO (KITS) ×1 IMPLANT
MANIFOLD NEPTUNE II (INSTRUMENTS) ×1 IMPLANT
NS IRRIG 500ML POUR BTL (IV SOLUTION) ×1 IMPLANT
PACK DNC HYST (MISCELLANEOUS) ×1 IMPLANT
PAD OB MATERNITY 4.3X12.25 (PERSONAL CARE ITEMS) ×1 IMPLANT
PAD PREP 24X41 OB/GYN DISP (PERSONAL CARE ITEMS) ×1 IMPLANT
SCRUB CHG 4% DYNA-HEX 4OZ (MISCELLANEOUS) ×1 IMPLANT
SEAL ROD LENS SCOPE MYOSURE (ABLATOR) ×1 IMPLANT
SET CYSTO W/LG BORE CLAMP LF (SET/KITS/TRAYS/PACK) IMPLANT
TOWEL OR 17X26 4PK STRL BLUE (TOWEL DISPOSABLE) ×1 IMPLANT
TRAP FLUID SMOKE EVACUATOR (MISCELLANEOUS) ×1 IMPLANT
WATER STERILE IRR 500ML POUR (IV SOLUTION) ×1 IMPLANT

## 2022-12-11 NOTE — Transfer of Care (Signed)
Immediate Anesthesia Transfer of Care Note  Patient: [name]  Procedure(s) Performed: HYSTEROSCOPY D&C  Patient Location: PACU  Anesthesia Type:General  Level of Consciousness: awake, alert , and oriented  Airway & Oxygen Therapy: Patient Spontanous Breathing  Post-op Assessment: Report given to RN and Post -op Vital signs reviewed and stable  Post vital signs: Reviewed and stable  Last Vitals:  Vitals Value Taken Time  BP 107/73 12/11/22 0824  Temp 36.4 C 12/11/22 0824  Pulse 78 12/11/22 0824  Resp 23 12/11/22 0824  SpO2 100 % 12/11/22 0824  Vitals shown include unvalidated device data.  Last Pain:  Vitals:   12/11/22 0614  TempSrc: Temporal  PainSc: 0-No pain      Patients Stated Pain Goal: 0 ([id number])  Complications: No notable events documented.

## 2022-12-11 NOTE — Discharge Instructions (Addendum)

## 2022-12-11 NOTE — H&P (Signed)
      PRE-OPERATIVE HISTORY AND PHYSICAL EXAM  PCP:  Boscia, Heather E, NP Subjective:   HPI:  Shannon Foley is a 36 y.o. G1P0010.  Patient's last menstrual period was 10/22/2022.  She presents today for a pre-op discussion and PE.  She has the following symptoms: Dysfunctional uterine bleeding, endometrial polyp, infertility  Review of Systems:   Constitutional: Denied constitutional symptoms, night sweats, recent illness, fatigue, fever, insomnia and weight loss.  Eyes: Denied eye symptoms, eye pain, photophobia, vision change and visual disturbance.  Ears/Nose/Throat/Neck: Denied ear, nose, throat or neck symptoms, hearing loss, nasal discharge, sinus congestion and sore throat.  Cardiovascular: Denied cardiovascular symptoms, arrhythmia, chest pain/pressure, edema, exercise intolerance, orthopnea and palpitations.  Respiratory: Denied pulmonary symptoms, asthma, pleuritic pain, productive sputum, cough, dyspnea and wheezing.  Gastrointestinal: Denied, gastro-esophageal reflux, melena, nausea and vomiting.  Genitourinary: See HPI for additional information.  Musculoskeletal: Denied musculoskeletal symptoms, stiffness, swelling, muscle weakness and myalgia.  Dermatologic: Denied dermatology symptoms, rash and scar.  Neurologic: Denied neurology symptoms, dizziness, headache, neck pain and syncope.  Psychiatric: Denied psychiatric symptoms, anxiety and depression.  Endocrine: Denied endocrine symptoms including hot flashes and night sweats.   OB History  Gravida Para Term Preterm AB Living  1 0 0 0 1 0  SAB IAB Ectopic Multiple Live Births  1 0 0 0 0    # Outcome Date GA Lbr Len/2nd Weight Sex Delivery Anes PTL Lv  1 SAB             Past Medical History:  Diagnosis Date   Back pain    mid and lower back   Chronic abdominal pain    Chronic constipation    Fibroids    one submucosal and one intramural (both  < 1 inch in size)   Headache    caused by pituitary cyst    IBS (irritable bowel syndrome)    Missed abortion    Motion sickness cars   Obesity    Pituitary cyst ([facility])    Rathke's pouch cyst    Past Surgical History:  Procedure Laterality Date   COLONOSCOPY     CYSTOSCOPY  05/10/2016   WNL   DILATION AND EVACUATION N/A 04/13/2020   Procedure: SUCTION DILATION AND CURETTAGE;  Surgeon: Staebler, Andreas, MD;  Location: [facility];  Service: Gynecology;  Laterality: N/A;   ESOPHAGOGASTRODUODENOSCOPY (EGD) WITH PROPOFOL N/A 02/18/2016   Procedure: ESOPHAGOGASTRODUODENOSCOPY (EGD) WITH PROPOFOL;  Surgeon: Darren Wohl, MD;  Location: [facility];  Service: Endoscopy;  Laterality: N/A;   TONSILLECTOMY  2017   TONSILLECTOMY        SOCIAL HISTORY:  Social History   Tobacco Use  Smoking Status Never   Passive exposure: Never  Smokeless Tobacco Never   Social History   Substance and Sexual Activity  Alcohol Use No    Social History   Substance and Sexual Activity  Drug Use No    Family History  Problem Relation Age of Onset   Diabetes Mother    Hypertension Mother    Congestive Heart Failure Mother    CVA Father        Had meningioma   Diabetes Father    Hypertension Father    Bladder Cancer Paternal Aunt    Colon cancer Neg Hx    Esophageal cancer Neg Hx    Stomach cancer Neg Hx    Rectal cancer Neg Hx     ALLERGIES:  Patient has no known allergies.    MEDS:   Current Outpatient Medications on File Prior to Visit  Medication Sig Dispense Refill   cyanocobalamin (VITAMIN B12) 1000 MCG/ML injection Inject once a week for 2 weeks and then 1 Per month ( pt needs syringes with needles as well ) self administered 2 mL 4   Syringe/Needle, Disp, (SYRINGE 3CC/22GX1") 22G X 1" 3 ML MISC 1 Device by Does not apply route every 30 (thirty) days. 10 each 3   Vitamin D, Ergocalciferol, (DRISDOL) 1.25 MG (50000 UNIT) CAPS capsule TAKE 1 CAPSULE BY MOUTH 1 TIME A WEEK 4 capsule 1   No current facility-administered medications on file  prior to visit.    No orders of the defined types were placed in this encounter.    Physical examination BP 135/87   Pulse 87   Ht 5' 3" (1.6 m)   Wt 243 lb 11.2 oz (110.5 kg)   LMP 10/22/2022   BMI 43.17 kg/m   General NAD, Conversant  HEENT Atraumatic; Op clear with mmm.  Normo-cephalic. Pupils reactive. Anicteric sclerae  Thyroid/Neck Smooth without nodularity or enlargement. Normal ROM.  Neck Supple.  Skin No rashes, lesions or ulceration. Normal palpated skin turgor. No nodularity.  Breasts: No masses or discharge.  Symmetric.  No axillary adenopathy.  Lungs: Clear to auscultation.No rales or wheezes. Normal Respiratory effort, no retractions.  Heart: NSR.  No murmurs or rubs appreciated. No peripheral edema  Abdomen: Soft.  Non-tender.  No masses.  No HSM. No hernia  Extremities: Moves all appropriately.  Normal ROM for age. No lymphadenopathy.  Neuro: Oriented to PPT.  Normal mood. Normal affect.     Pelvic: Deferred to the OR  -please see ultrasound report   Assessment:   G1P0010 Patient Active Problem List   Diagnosis Date Noted   Vitamin D deficiency 07/13/2022   Impaired fasting glucose 07/13/2022   BMI 40.0-44.9, adult ([facility]) 06/11/2022   Bacterial vaginitis 08/02/2021   Recurrent pansinusitis 10/08/2020   Abnormal biochemical finding on antenatal screening of mother 04/01/2020   Vaginal candida 03/18/2019   CIN I (cervical intraepithelial neoplasia I) 02/13/2019   Acute otitis media 01/06/2019   Acute upper respiratory infection 12/08/2018   Fever and chills 12/08/2018   Sore throat 12/08/2018   Hiatal hernia 06/23/2018   Left hip pain 06/23/2018   Needs flu shot 06/23/2018   Uterine fibroid 06/13/2018   IBS (irritable bowel syndrome) 05/29/2018   Low grade squamous intraepithelial lesion (LGSIL) on cervical Pap smear 05/27/2018   Encounter for general adult medical examination with abnormal findings 04/03/2018   Gastroesophageal reflux disease without  esophagitis 04/03/2018   Chronic midline low back pain without sciatica 04/03/2018   Chronic midline thoracic back pain 04/03/2018   Urinary tract infection without hematuria 04/03/2018   Dysuria 04/03/2018   Chest pain 01/08/2018   Epigastric abdominal pain 01/08/2018   Abnormal findings-gastrointestinal tract    Rathke's cleft cyst ([facility]) 02/02/2016   Anemia 04/29/2014   Fatigue 04/29/2014   Hyperprolactinemia ([facility]) 04/29/2014   Vitamin B 12 deficiency 04/29/2014   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 Pre-op exam   2. Fibroids   3. Abnormal uterine bleeding due   to endometrial polyp      Plan:   Orders: No orders of the defined types were placed in this encounter.    1.  Hysteroscopy D&C   

## 2022-12-11 NOTE — Anesthesia Postprocedure Evaluation (Signed)
Anesthesia Post Note  Patient: Shannon Foley  Procedure(s) Performed: HYSTEROSCOPY D&C (Vagina )  Patient location during evaluation: PACU Anesthesia Type: General Level of consciousness: awake and alert, oriented and patient cooperative Pain management: pain level controlled Vital Signs Assessment: post-procedure vital signs reviewed and stable Respiratory status: spontaneous breathing, nonlabored ventilation and respiratory function stable Cardiovascular status: blood pressure returned to baseline and stable Postop Assessment: adequate PO intake Anesthetic complications: no   No notable events documented.   Last Vitals:  Vitals:   12/11/22 0847 12/11/22 0900  BP: 118/86 120/76  Pulse: 85 83  Resp: 19 17  Temp:  (!) 36.3 C  SpO2: 97% 96%    Last Pain:  Vitals:   12/11/22 0900  TempSrc:   PainSc: [place]

## 2022-12-11 NOTE — Op Note (Signed)
   OPERATIVE NOTE 12/11/2022 8:18 AM  PRE-OPERATIVE DIAGNOSIS:  1) Endometrial Polyp  POST-OPERATIVE DIAGNOSIS:  Same with obviously thickened endometrium  OPERATION:  D&C  SURGEON(S): Surgeon(s) and Role:    Harlin Heys, MD - Primary   ANESTHESIA: Choice  ESTIMATED BLOOD LOSS: 5 mL  OPERATIVE FINDINGS: Very shaggy thick endometrium  SPECIMEN:  ID Type Source Tests Collected by Time Destination  1 : endometrium curettings GYN Endometrium Curettage SURGICAL PATHOLOGY Harlin Heys, MD 12/11/2022 0802   2 : endocervical curettings GYN Endocervical Curettage SURGICAL PATHOLOGY Harlin Heys, MD [phone] [id number]     COMPLICATIONS: None  DRAINS: None  DISPOSITION: Stable to recovery room  DESCRIPTION OF PROCEDURE:      The patient was prepped and draped in the dorsal lithotomy position and placed under general anesthesia. Her cervix was grasped with a Jacob's tenaculum. Respecting the position and curvature of her cervix, it was dilated to accommodate a hysteroscope. Avery thickened shaggy endometrium was noted. A curettage was performed in all quadrants until a gritty texture was noted. The hysteroscope was repalced and some area still thickened with possible polyp was noted. Changing size of curette, a further curettage was performed in the areas of question. The hysteroscope was replaced and the area of question were gone. A systematic complete curettage was confirmed. The tenaculum was removed from the cervix and hemostasis was noted. The weighted speculum was removed and the patient went to recovery room in stable condition.  FU 4 weeks  Finis Bud, M.D. 12/11/2022 8:18 AM

## 2022-12-11 NOTE — Anesthesia Procedure Notes (Signed)
Procedure Name: LMA Insertion Date/Time: 12/11/2022 7:39 AM  Performed by: Hedda Slade, CRNAPre-anesthesia Checklist: Patient identified, Patient being monitored, Timeout performed, Emergency Drugs available and Suction available Patient Re-evaluated:Patient Re-evaluated prior to induction Oxygen Delivery Method: Circle system utilized Preoxygenation: Pre-oxygenation with 100% oxygen Induction Type: IV induction Ventilation: Mask ventilation without difficulty LMA: LMA inserted LMA Size: 4.0 Tube type: Oral Number of attempts: 1 Placement Confirmation: positive ETCO2 and breath sounds checked- equal and bilateral Tube secured with: Tape Dental Injury: Teeth and Oropharynx as per pre-operative assessment

## 2022-12-12 ENCOUNTER — Encounter: Payer: Self-pay | Admitting: Obstetrics and Gynecology

## 2022-12-12 LAB — SURGICAL PATHOLOGY

## 2022-12-12 NOTE — Telephone Encounter (Signed)
Spoke with patient, letter sent

## 2022-12-18 ENCOUNTER — Encounter: Payer: Self-pay | Admitting: Obstetrics and Gynecology

## 2022-12-19 ENCOUNTER — Other Ambulatory Visit (HOSPITAL_COMMUNITY)
Admission: RE | Admit: 2022-12-19 | Discharge: 2022-12-19 | Disposition: A | Discharge status: Home / Self Care | Payer: Medicaid Other | Source: Ambulatory Visit | Attending: Obstetrics and Gynecology | Admitting: Obstetrics and Gynecology

## 2022-12-19 ENCOUNTER — Ambulatory Visit (INDEPENDENT_AMBULATORY_CARE_PROVIDER_SITE_OTHER): Payer: Medicaid Other

## 2022-12-19 VITALS — BP 128/85 | HR 84 | Wt 241.8 lb

## 2022-12-19 DIAGNOSIS — N898 Other specified noninflammatory disorders of vagina: Secondary | ICD-10-CM

## 2022-12-19 NOTE — Progress Notes (Signed)
    NURSE VISIT NOTE  Subjective:    Patient ID: Shannon Foley, female    DOB: 25-Jun-1986, 37 y.o.   MRN: [id number]  HPI  Patient is a 37 y.o. G76P0010 female who presents for yellowish discharge. vaginal discharge for 2  day(s).Patient states lower back pain and stomach pains since the surgery. Denies abnormal vaginal bleeding or significant pelvic pain or fever. denies  Denies any UTI Symptoms . Patient denies history of known exposure to STD.   Objective:    BP 128/85   Pulse 84   Wt 241 lb 12.8 oz (109.7 kg)   LMP 11/16/2022 Comment: Patient reports "ongoing"  BMI 42.83 kg/m     Plan:   BV and Candida probe sent to lab. Treatment: abstain from coitus during course of treatment, waiting on results/ ROV prn if symptoms persist or worsen.   Inis Sizer, CMA

## 2022-12-21 LAB — CERVICOVAGINAL ANCILLARY ONLY
Comment: NEGATIVE
Comment: NEGATIVE
Comment: NEGATIVE

## 2022-12-26 ENCOUNTER — Ambulatory Visit (INDEPENDENT_AMBULATORY_CARE_PROVIDER_SITE_OTHER): Payer: Medicaid Other | Admitting: Obstetrics and Gynecology

## 2022-12-26 ENCOUNTER — Encounter: Payer: Self-pay | Admitting: Obstetrics and Gynecology

## 2022-12-26 VITALS — BP 129/85 | HR 90 | Ht 63.0 in | Wt 242.0 lb

## 2022-12-26 DIAGNOSIS — N8501 Benign endometrial hyperplasia: Secondary | ICD-10-CM

## 2022-12-26 DIAGNOSIS — N939 Abnormal uterine and vaginal bleeding, unspecified: Secondary | ICD-10-CM

## 2022-12-26 DIAGNOSIS — Z9889 Other specified postprocedural states: Secondary | ICD-10-CM

## 2022-12-26 MED ORDER — MEGESTROL ACETATE 40 MG PO TABS: 40.0000 mg | ORAL_TABLET | Freq: Two times a day (BID) | ORAL | 2 refills | Status: AC

## 2022-12-26 NOTE — Progress Notes (Signed)
HPI:      [name] is a 37 y.o. G1P0010 who LMP was Patient's last menstrual period was 11/16/2022.  Subjective:   She presents today 2 weeks from [facility] and hysteroscopy for endometrial polyp.  Subsequent pathology revealed endometrial hyperplasia (simple) in an endometrial polyp. Patient is still considering pregnancy in the near future. She reports she is doing well at this time having some light bleeding.    Hx: The following portions of the patient's history were reviewed and updated as appropriate:             She  has a past medical history of Back pain, Chronic abdominal pain, Chronic constipation, Endometrial polyp, Fibroids, Headache, IBS (irritable bowel syndrome), Missed abortion, Morbid obesity with BMI of 40.0-44.9, adult, Motion sickness (cars), Pituitary cyst, Vitamin B12 deficiency, and Vitamin D deficiency. She does not have any pertinent problems on file. She  has a past surgical history that includes Tonsillectomy (2015); Esophagogastroduodenoscopy (egd) with propofol (N/A, 02/18/2016); Cystoscopy (05/10/2016); Colonoscopy; Dilation and evacuation (N/A, 04/13/2020); and Hysteroscopy with D & C (N/A, 12/11/2022). Her family history includes Bladder Cancer in her paternal aunt; CVA in her father; Congestive Heart Failure in her mother; Diabetes in her father and mother; Hypertension in her father and mother. She  reports that she has never smoked. She has never been exposed to tobacco smoke. She has never used smokeless tobacco. She reports that she does not drink alcohol and does not use drugs. She has a current medication list which includes the following prescription(s): hydrocodone-acetaminophen, ibuprofen, megestrol, and vitamin d (ergocalciferol). She has No Known Allergies.       Review of Systems:  Review of Systems  Constitutional: Denied constitutional symptoms, night sweats, recent illness, fatigue, fever, insomnia and weight loss.  Eyes: Denied eye  symptoms, eye pain, photophobia, vision change and visual disturbance.  Ears/Nose/Throat/Neck: Denied ear, nose, throat or neck symptoms, hearing loss, nasal discharge, sinus congestion and sore throat.  Cardiovascular: Denied cardiovascular symptoms, arrhythmia, chest pain/pressure, edema, exercise intolerance, orthopnea and palpitations.  Respiratory: Denied pulmonary symptoms, asthma, pleuritic pain, productive sputum, cough, dyspnea and wheezing.  Gastrointestinal: Denied, gastro-esophageal reflux, melena, nausea and vomiting.  Genitourinary: Denied genitourinary symptoms including symptomatic vaginal discharge, pelvic relaxation issues, and urinary complaints.  Musculoskeletal: Denied musculoskeletal symptoms, stiffness, swelling, muscle weakness and myalgia.  Dermatologic: Denied dermatology symptoms, rash and scar.  Neurologic: Denied neurology symptoms, dizziness, headache, neck pain and syncope.  Psychiatric: Denied psychiatric symptoms, anxiety and depression.  Endocrine: Denied endocrine symptoms including hot flashes and night sweats.   Meds:   Current Outpatient Medications on File Prior to Visit  Medication Sig Dispense Refill   HYDROcodone-acetaminophen (NORCO/VICODIN) 5-325 MG tablet Take 1-2 tablets by mouth every 6 (six) hours as needed for moderate pain. 10 tablet 0   ibuprofen (ADVIL) 600 MG tablet Take 1 tablet (600 mg total) by mouth every 6 (six) hours as needed. 60 tablet 0   Vitamin D, Ergocalciferol, (DRISDOL) 1.25 MG (50000 UNIT) CAPS capsule TAKE 1 CAPSULE BY MOUTH 1 TIME A WEEK (Patient taking differently: Take 50,000 Units by mouth every 7 (seven) days. Monday) 4 capsule 1   No current facility-administered medications on file prior to visit.      Objective:     Vitals:   12/26/22 1045  BP: 129/85  Pulse: 90   Filed Weights   12/26/22 1045  Weight: 242 lb (109.8 kg)  Pathology results and surgery reviewed directly with the patient.           Assessment:    G1P0010 Patient Active Problem List   Diagnosis Date Noted   Vitamin D deficiency 07/13/2022   Impaired fasting glucose 07/13/2022   BMI 40.0-44.9, adult 06/11/2022   Bacterial vaginitis 08/02/2021   Recurrent pansinusitis 10/08/2020   Abnormal biochemical finding on antenatal screening of mother 04/01/2020   Vaginal candida 03/18/2019   CIN I (cervical intraepithelial neoplasia I) 02/13/2019   Acute otitis media 01/06/2019   Acute upper respiratory infection 12/08/2018   Fever and chills 12/08/2018   Sore throat 12/08/2018   Hiatal hernia 06/23/2018   Left hip pain 06/23/2018   Needs flu shot 06/23/2018   Uterine fibroid 06/13/2018   IBS (irritable bowel syndrome) 05/29/2018   Low grade squamous intraepithelial lesion (LGSIL) on cervical Pap smear 05/27/2018   Encounter for general adult medical examination with abnormal findings 04/03/2018   Gastroesophageal reflux disease without esophagitis 04/03/2018   Chronic midline low back pain without sciatica 04/03/2018   Chronic midline thoracic back pain 04/03/2018   Urinary tract infection without hematuria 04/03/2018   Dysuria 04/03/2018   Chest pain 01/08/2018   Epigastric abdominal pain 01/08/2018   Abnormal findings-gastrointestinal tract    Rathke's cleft cyst 02/02/2016   Anemia 04/29/2014   Fatigue 04/29/2014   Hyperprolactinemia 04/29/2014   Vitamin B 12 deficiency 04/29/2014   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 Postoperative state   2. Abnormal uterine bleeding due to endometrial polyp   3. Endometrial hyperplasia, simple     Endometrial hyperplasia  and a polyp   Plan:            1.  Discussed multiple options but I have recommended use of Megace for 3 months.  This should keep her amenorrheic and resolve any possible endometrial hyperplasia issues.  After that time the patient would like to become pregnant.  I have advised her that if she does not want future pregnancy she should consider long-acting birth control like IUD for endometrial suppression.  All questions were answered. Orders No orders of the defined types were placed in this encounter.    Meds ordered this encounter  Medications   megestrol (MEGACE) 40 MG tablet    Sig: Take 1 tablet (40 mg total) by mouth 2 (two) times daily.    Dispense:  60 tablet    Refill:  2      F/U  No follow-ups on file.  Elonda Husky, M.D. 12/26/2022 11:29 AM

## 2022-12-26 NOTE — Progress Notes (Signed)
Patient presents for 2 week postop follow-up following polypectomy. She states her concerns of yellow discharge have resolved, still having spotting to light bleeding.

## 2023-01-29 ENCOUNTER — Encounter: Payer: Self-pay | Admitting: Obstetrics and Gynecology

## 2023-01-31 ENCOUNTER — Other Ambulatory Visit (HOSPITAL_COMMUNITY)
Admission: RE | Admit: 2023-01-31 | Discharge: 2023-01-31 | Disposition: A | Discharge status: Home / Self Care | Payer: Medicaid Other | Source: Ambulatory Visit | Attending: Obstetrics and Gynecology | Admitting: Obstetrics and Gynecology

## 2023-01-31 ENCOUNTER — Encounter: Payer: Self-pay | Admitting: Obstetrics and Gynecology

## 2023-01-31 ENCOUNTER — Ambulatory Visit (INDEPENDENT_AMBULATORY_CARE_PROVIDER_SITE_OTHER): Payer: Medicaid Other | Admitting: Obstetrics and Gynecology

## 2023-01-31 VITALS — BP 125/85 | HR 94 | Ht 63.0 in | Wt 245.8 lb

## 2023-01-31 DIAGNOSIS — R109 Unspecified abdominal pain: Secondary | ICD-10-CM | POA: Insufficient documentation

## 2023-01-31 DIAGNOSIS — Z9889 Other specified postprocedural states: Secondary | ICD-10-CM

## 2023-01-31 DIAGNOSIS — N898 Other specified noninflammatory disorders of vagina: Secondary | ICD-10-CM | POA: Insufficient documentation

## 2023-01-31 DIAGNOSIS — R350 Frequency of micturition: Secondary | ICD-10-CM | POA: Diagnosis not present

## 2023-01-31 LAB — POCT URINALYSIS DIPSTICK
Bilirubin, UA: NEGATIVE
Blood, UA: NEGATIVE
Glucose, UA: NEGATIVE
Ketones, UA: NEGATIVE
Leukocytes, UA: NEGATIVE
Nitrite, UA: NEGATIVE
Protein, UA: NEGATIVE
Spec Grav, UA: 1.025 (ref 1.010–1.025)
Urobilinogen, UA: 0.2 E.U./dL
pH, UA: 5 (ref 5.0–8.0)

## 2023-01-31 NOTE — Progress Notes (Signed)
HPI:      [name] is a 37 y.o. G1P0010 who LMP was No LMP recorded.  Subjective:   She presents today stating that she has been experiencing intermittent abdominal pain over the last week.  She states that in the morning in the evening she typically has some abdominal pain.  She reports that she can function through the pain-it is not disabling.  She reports that she has urinary frequency.  She also states that her bowel movements have been normal.  She is taking Megace at this time and not having any bleeding or cramping.  She did read the possible side effects for Megace and abdominal pain is one of them.  She is also complaining of some increased vaginal discharge.    Hx: The following portions of the patient's history were reviewed and updated as appropriate:             She  has a past medical history of Back pain, Chronic abdominal pain, Chronic constipation, Endometrial polyp, Fibroids, Headache, IBS (irritable bowel syndrome), Missed abortion, Morbid obesity with BMI of 40.0-44.9, adult (HCC), Motion sickness (cars), Pituitary cyst (HCC), Vitamin B12 deficiency, and Vitamin D deficiency. She does not have any pertinent problems on file. She  has a past surgical history that includes Tonsillectomy (2015); Esophagogastroduodenoscopy (egd) with propofol (N/A, 02/18/2016); Cystoscopy (05/10/2016); Colonoscopy; Dilation and evacuation (N/A, 04/13/2020); and Hysteroscopy with D & C (N/A, 12/11/2022). Her family history includes Bladder Cancer in her paternal aunt; CVA in her father; Congestive Heart Failure in her mother; Diabetes in her father and mother; Hypertension in her father and mother. She  reports that she has never smoked. She has never been exposed to tobacco smoke. She has never used smokeless tobacco. She reports that she does not drink alcohol and does not use drugs. She has a current medication list which includes the following prescription(s): ibuprofen, megestrol, and  vitamin d (ergocalciferol). She has No Known Allergies.       Review of Systems:  Review of Systems  Constitutional: Denied constitutional symptoms, night sweats, recent illness, fatigue, fever, insomnia and weight loss.  Eyes: Denied eye symptoms, eye pain, photophobia, vision change and visual disturbance.  Ears/Nose/Throat/Neck: Denied ear, nose, throat or neck symptoms, hearing loss, nasal discharge, sinus congestion and sore throat.  Cardiovascular: Denied cardiovascular symptoms, arrhythmia, chest pain/pressure, edema, exercise intolerance, orthopnea and palpitations.  Respiratory: Denied pulmonary symptoms, asthma, pleuritic pain, productive sputum, cough, dyspnea and wheezing.  Gastrointestinal: Denied, gastro-esophageal reflux, melena, nausea and vomiting.  Genitourinary: See HPI for additional information.  Musculoskeletal: Denied musculoskeletal symptoms, stiffness, swelling, muscle weakness and myalgia.  Dermatologic: Denied dermatology symptoms, rash and scar.  Neurologic: Denied neurology symptoms, dizziness, headache, neck pain and syncope.  Psychiatric: Denied psychiatric symptoms, anxiety and depression.  Endocrine: Denied endocrine symptoms including hot flashes and night sweats.   Meds:   Current Outpatient Medications on File Prior to Visit  Medication Sig Dispense Refill   ibuprofen (ADVIL) 600 MG tablet Take 1 tablet (600 mg total) by mouth every 6 (six) hours as needed. 60 tablet 0   megestrol (MEGACE) 40 MG tablet Take 1 tablet (40 mg total) by mouth 2 (two) times daily. 60 tablet 2   Vitamin D, Ergocalciferol, (DRISDOL) 1.25 MG (50000 UNIT) CAPS capsule TAKE 1 CAPSULE BY MOUTH 1 TIME A WEEK (Patient taking differently: Take 50,000 Units by mouth every 7 (seven) days. Monday) 4 capsule 1   No current facility-administered medications on file prior  to visit.      Objective:     Vitals:   01/31/23 1059 01/31/23 1109  BP: (!) 131/90 125/85  Pulse: 94     Filed Weights   01/31/23 1059  Weight: 245 lb 12.8 oz (111.5 kg)                        Assessment:    G1P0010 Patient Active Problem List   Diagnosis Date Noted   Vitamin D deficiency 07/13/2022   Impaired fasting glucose 07/13/2022   BMI 40.0-44.9, adult (HCC) 06/11/2022   Bacterial vaginitis 08/02/2021   Recurrent pansinusitis 10/08/2020   Abnormal biochemical finding on antenatal screening of mother 04/01/2020   Vaginal candida 03/18/2019   CIN I (cervical intraepithelial neoplasia I) 02/13/2019   Acute otitis media 01/06/2019   Acute upper respiratory infection 12/08/2018   Fever and chills 12/08/2018   Sore throat 12/08/2018   Hiatal hernia 06/23/2018   Left hip pain 06/23/2018   Needs flu shot 06/23/2018   Uterine fibroid 06/13/2018   IBS (irritable bowel syndrome) 05/29/2018   Low grade squamous intraepithelial lesion (LGSIL) on cervical Pap smear 05/27/2018   Encounter for general adult medical examination with abnormal findings 04/03/2018   Gastroesophageal reflux disease without esophagitis 04/03/2018   Chronic midline low back pain without sciatica 04/03/2018   Chronic midline thoracic back pain 04/03/2018   Urinary tract infection without hematuria 04/03/2018   Dysuria 04/03/2018   Chest pain 01/08/2018   Epigastric abdominal pain 01/08/2018   Abnormal findings-gastrointestinal tract    Rathke's cleft cyst (HCC) 02/02/2016   Anemia 04/29/2014   Fatigue 04/29/2014   Hyperprolactinemia (HCC) 04/29/2014   Vitamin B 12 deficiency 04/29/2014   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 Vaginal discharge   2.  Vaginal odor   3. Urinary frequency   4. Stomach pain     I doubt that the Megace is causing her abdominal pain.  It is keeping her from bleeding.  Urine dip appears negative.   Plan:            1.  Discussed Megace with the patient-reassured her  2.  Nuswab performed   Offered pelvic ultrasound to further delineate possibilities for pelvic/abdominal pain.  Patient has elected to undergo expectant management over the next week.  If she continues to experience pain or if it worsens she will consider ultrasound.  She will contact us via MyChart for follow-up.  Orders Orders Placed This Encounter  Procedures   Urine Culture   POCT urinalysis dipstick    No orders of the defined types were placed in this encounter.     F/U  Return if symptoms worsen or fail to improve, for Pt to contact us if symptoms worsen. I spent 21 minutes involved in the care of this patient preparing to see the patient by obtaining and reviewing her medical history (including labs, imaging tests and prior procedures), documenting clinical information in the electronic health record (EHR), counseling and coordinating care plans, writing and sending prescriptions, ordering tests or procedures and in direct communicating with the patient and medical staff discussing pertinent items  from her history and physical exam.  Elonda Husky, M.D. 01/31/2023 11:27 AM

## 2023-01-31 NOTE — Progress Notes (Signed)
Patient presents today due to a week of stomach pain and vaginal concerns. She reports pain shooting through her stomach for about 1 week, unsure if it is related to her medication. Concerns of vaginal discharge, odor and urinary frequency as well.

## 2023-02-01 LAB — CERVICOVAGINAL ANCILLARY ONLY
Bacterial Vaginitis (gardnerella): NEGATIVE
Candida Glabrata: NEGATIVE
Candida Vaginitis: NEGATIVE
Chlamydia: NEGATIVE
Comment: NEGATIVE
Comment: NEGATIVE
Comment: NEGATIVE
Comment: NEGATIVE
Comment: NEGATIVE
Comment: NORMAL
Neisseria Gonorrhea: NEGATIVE
Trichomonas: NEGATIVE

## 2023-02-03 ENCOUNTER — Encounter: Payer: Self-pay | Admitting: Obstetrics and Gynecology

## 2023-02-03 LAB — URINE CULTURE

## 2023-02-05 ENCOUNTER — Other Ambulatory Visit: Payer: Self-pay

## 2023-02-05 DIAGNOSIS — B951 Streptococcus, group B, as the cause of diseases classified elsewhere: Secondary | ICD-10-CM

## 2023-02-05 DIAGNOSIS — N390 Urinary tract infection, site not specified: Secondary | ICD-10-CM

## 2023-02-05 MED ORDER — AMPICILLIN 500 MG PO CAPS: 500.0000 mg | ORAL_CAPSULE | Freq: Four times a day (QID) | ORAL | 0 refills | Status: DC

## 2023-04-01 ENCOUNTER — Encounter: Payer: Self-pay | Admitting: Obstetrics and Gynecology

## 2023-04-18 ENCOUNTER — Ambulatory Visit: Payer: Medicaid Other | Admitting: Obstetrics and Gynecology

## 2023-04-18 DIAGNOSIS — N939 Abnormal uterine and vaginal bleeding, unspecified: Secondary | ICD-10-CM

## 2023-05-02 ENCOUNTER — Ambulatory Visit (INDEPENDENT_AMBULATORY_CARE_PROVIDER_SITE_OTHER): Payer: Medicaid Other | Admitting: Obstetrics and Gynecology

## 2023-05-02 ENCOUNTER — Encounter: Payer: Self-pay | Admitting: Obstetrics and Gynecology

## 2023-05-02 VITALS — BP 127/86 | HR 103 | Ht 63.0 in | Wt 247.6 lb

## 2023-05-02 DIAGNOSIS — R350 Frequency of micturition: Secondary | ICD-10-CM

## 2023-05-02 DIAGNOSIS — N926 Irregular menstruation, unspecified: Secondary | ICD-10-CM

## 2023-05-02 DIAGNOSIS — R399 Unspecified symptoms and signs involving the genitourinary system: Secondary | ICD-10-CM

## 2023-05-02 DIAGNOSIS — R3915 Urgency of urination: Secondary | ICD-10-CM | POA: Diagnosis not present

## 2023-05-02 LAB — POCT URINALYSIS DIPSTICK
Bilirubin, UA: NEGATIVE
Glucose, UA: NEGATIVE
Ketones, UA: NEGATIVE
Leukocytes, UA: NEGATIVE
Nitrite, UA: NEGATIVE
Protein, UA: NEGATIVE
Spec Grav, UA: 1.02 (ref 1.010–1.025)
Urobilinogen, UA: 0.2 E.U./dL
pH, UA: 6 (ref 5.0–8.0)

## 2023-05-02 MED ORDER — NITROFURANTOIN MONOHYD MACRO 100 MG PO CAPS: 100.0000 mg | ORAL_CAPSULE | Freq: Two times a day (BID) | ORAL | 1 refills | Status: DC

## 2023-05-02 NOTE — Progress Notes (Signed)
HPI:      [name] is a 37 y.o. G1P0010 who LMP was No LMP recorded.  Subjective:   She presents today stating that she has urinary frequency and urgency.  She often urinates only small amounts when she goes.  She also complains of occasional back pain.  She believes she may have a UTI.  Additionally she has completed her course of Megace and has not yet had her first menses.    Hx: The following portions of the patient's history were reviewed and updated as appropriate:             She  has a past medical history of Back pain, Chronic abdominal pain, Chronic constipation, Endometrial polyp, Fibroids, Headache, IBS (irritable bowel syndrome), Missed abortion, Morbid obesity with BMI of 40.0-44.9, adult (HCC), Motion sickness (cars), Pituitary cyst (HCC), Vitamin B12 deficiency, and Vitamin D deficiency. She does not have any pertinent problems on file. She  has a past surgical history that includes Tonsillectomy (2015); Esophagogastroduodenoscopy (egd) with propofol (N/A, 02/18/2016); Cystoscopy (05/10/2016); Colonoscopy; Dilation and evacuation (N/A, 04/13/2020); and Hysteroscopy with D & C (N/A, 12/11/2022). Her family history includes Bladder Cancer in her paternal aunt; CVA in her father; Congestive Heart Failure in her mother; Diabetes in her father and mother; Hypertension in her father and mother. She  reports that she has never smoked. She has never been exposed to tobacco smoke. She has never used smokeless tobacco. She reports that she does not drink alcohol and does not use drugs. She has a current medication list which includes the following prescription(s): ibuprofen, nitrofurantoin (macrocrystal-monohydrate), and vitamin d (ergocalciferol). She has No Known Allergies.       Review of Systems:  Review of Systems  Constitutional: Denied constitutional symptoms, night sweats, recent illness, fatigue, fever, insomnia and weight loss.  Eyes: Denied eye symptoms, eye pain,  photophobia, vision change and visual disturbance.  Ears/Nose/Throat/Neck: Denied ear, nose, throat or neck symptoms, hearing loss, nasal discharge, sinus congestion and sore throat.  Cardiovascular: Denied cardiovascular symptoms, arrhythmia, chest pain/pressure, edema, exercise intolerance, orthopnea and palpitations.  Respiratory: Denied pulmonary symptoms, asthma, pleuritic pain, productive sputum, cough, dyspnea and wheezing.  Gastrointestinal: Denied, gastro-esophageal reflux, melena, nausea and vomiting.  Genitourinary: See HPI for additional information.  Musculoskeletal: Denied musculoskeletal symptoms, stiffness, swelling, muscle weakness and myalgia.  Dermatologic: Denied dermatology symptoms, rash and scar.  Neurologic: Denied neurology symptoms, dizziness, headache, neck pain and syncope.  Psychiatric: Denied psychiatric symptoms, anxiety and depression.  Endocrine: Denied endocrine symptoms including hot flashes and night sweats.   Meds:   Current Outpatient Medications on File Prior to Visit  Medication Sig Dispense Refill   ibuprofen (ADVIL) 600 MG tablet Take 1 tablet (600 mg total) by mouth every 6 (six) hours as needed. 60 tablet 0   Vitamin D, Ergocalciferol, (DRISDOL) 1.25 MG (50000 UNIT) CAPS capsule TAKE 1 CAPSULE BY MOUTH 1 TIME A WEEK (Patient taking differently: Take 50,000 Units by mouth every 7 (seven) days. Monday) 4 capsule 1   No current facility-administered medications on file prior to visit.      Objective:     Vitals:   05/02/23 1454  BP: 127/86  Pulse: (!) 103   Filed Weights   05/02/23 1454  Weight: 247 lb 9.6 oz (112.3 kg)              Urine dip shows moderate blood          Assessment:    G1P0010 Patient  Active Problem List   Diagnosis Date Noted   Vitamin D deficiency 07/13/2022   Impaired fasting glucose 07/13/2022   BMI 40.0-44.9, adult (HCC) 06/11/2022   Bacterial vaginitis 08/02/2021   Recurrent pansinusitis 10/08/2020    Abnormal biochemical finding on antenatal screening of mother 04/01/2020   Vaginal candida 03/18/2019   CIN I (cervical intraepithelial neoplasia I) 02/13/2019   Acute otitis media 01/06/2019   Acute upper respiratory infection 12/08/2018   Fever and chills 12/08/2018   Sore throat 12/08/2018   Hiatal hernia 06/23/2018   Left hip pain 06/23/2018   Needs flu shot 06/23/2018   Uterine fibroid 06/13/2018   IBS (irritable bowel syndrome) 05/29/2018   Low grade squamous intraepithelial lesion (LGSIL) on cervical Pap smear 05/27/2018   Encounter for general adult medical examination with abnormal findings 04/03/2018   Gastroesophageal reflux disease without esophagitis 04/03/2018   Chronic midline low back pain without sciatica 04/03/2018   Chronic midline thoracic back pain 04/03/2018   Urinary tract infection without hematuria 04/03/2018   Dysuria 04/03/2018   Chest pain 01/08/2018   Epigastric abdominal pain 01/08/2018   Abnormal findings-gastrointestinal tract    Rathke's cleft cyst (HCC) 02/02/2016   Anemia 04/29/2014   Fatigue 04/29/2014   Hyperprolactinemia (HCC) 04/29/2014   Vitamin B 12 deficiency 04/29/2014   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 UTI symptoms   2. Urinary frequency     Probable UTI based on symptoms and urine dip   Plan:            1.  Will treat presumptively with Macrobid.  Patient to contact us if her condition does not improve.  2.  Expect first menses after Megace in mid to late August. Orders Orders Placed This Encounter  Procedures   POCT Urinalysis Dipstick     Meds ordered this encounter   Medications   nitrofurantoin, macrocrystal-monohydrate, (MACROBID) 100 MG capsule    Sig: Take 1 capsule (100 mg total) by mouth 2 (two) times daily.    Dispense:  14 capsule    Refill:  1      F/U  Return for Annual Physical.  Elonda Husky, M.D. 05/02/2023 3:10 PM

## 2023-05-02 NOTE — Progress Notes (Signed)
Patient presents today due to UTI symptoms. She has complaints of urinary frequency and low back pain, denies dysuria. Patient would also like to know when her cycle should return after 3 months of Megace.

## 2023-05-13 ENCOUNTER — Encounter: Payer: Self-pay | Admitting: Nurse Practitioner

## 2023-05-18 ENCOUNTER — Encounter: Payer: Self-pay | Admitting: Family Medicine

## 2023-05-18 ENCOUNTER — Ambulatory Visit (INDEPENDENT_AMBULATORY_CARE_PROVIDER_SITE_OTHER): Payer: Medicaid Other | Admitting: Family Medicine

## 2023-05-18 VITALS — BP 138/86 | HR 82 | Ht 63.0 in | Wt 247.8 lb

## 2023-05-18 DIAGNOSIS — M722 Plantar fascial fibromatosis: Secondary | ICD-10-CM | POA: Diagnosis not present

## 2023-05-18 DIAGNOSIS — N644 Mastodynia: Secondary | ICD-10-CM | POA: Diagnosis not present

## 2023-05-18 NOTE — Assessment & Plan Note (Addendum)
Patient complains of breast pain".  On exam it appears to be musculoskeletal, likely costochondritis on the right side near the second and third ribs.  Advised topical heat, stretching, oral NSAIDs as needed, advised against overexertion/heavy lifting.  Patient desired mammogram as well.  Will send an order for mammogram, though this appears less likely an issue of the breast tissue.  Referral was sent in to sports medicine for plantar fasciitis, but may also help patient with exercises for her costochondritis if her pain persists.

## 2023-05-18 NOTE — Assessment & Plan Note (Signed)
Referred to sports medicine.  Has seen podiatry in the past and has been fitted for orthotics that did not help.  Discussed calf stretching and toe extension stretching exercises.  Recommended against tight fitting shoes or sandals with poor arch support/heel support.

## 2023-05-18 NOTE — Progress Notes (Signed)
     Acute Office Visit  Subjective:     Patient ID: Shannon Foley, female    DOB: [birth year], 37 y.o.   MRN: [id number]  Chief Complaint  Patient presents with   Breast Pain    HPI Patient is in today for breast pain.  Pain is on the right side just lateral to the sternum.  It is tender to palpation.  She describes it as a sharp pain.  Sometimes it is worse if she moves her arms in certain positions.  Pain is pretty much all day long.  Is on been ongoing for about a month.  No family history of breast cancer.  No breast skin changes or nipple discharge.  She has been taking once daily ibuprofen 800 mg which was originally prescribed for menstrual cramps.  Patient has plantar fasciitis pain in both feet.  Has been diagnosed in the past and went to a podiatrist.  Was fitted for orthotics but did not feel like these helped her pain.  Had an injection and this improved her pain for a significant period of time.  Now complaining of recurrence of pain.   ROS      Objective:    BP 138/86   Pulse 82   Ht 5\' 3"  (1.6 m)   Wt 247 lb 12.8 oz (112.4 kg)   SpO2 98%   BMI 43.90 kg/m    Physical Exam General: Alert and oriented MSK: Tenderness to palpation over the third rib on the right side near the costal cartilage. Lymphadenopathy: No right-sided axillary lymphadenopathy CV: Regular rhythm Pulmonary: Lungs clear bilaterally  No results found for any visits on 05/18/23.      Assessment & Plan:   Plantar fasciitis -     Ambulatory referral to Sports Medicine  Breast pain Assessment & Plan: Patient complains of breast pain".  On exam it appears to be musculoskeletal, likely costochondritis on the right side near the second and third ribs.  Advised topical heat, stretching, oral NSAIDs as needed, advised against overexertion/heavy lifting.  Patient desired mammogram as well.  Will send an order for mammogram, though this appears less likely an issue of the breast tissue.   Referral was sent in to sports medicine for plantar fasciitis, but may also help patient with exercises for her costochondritis if her pain persists.  Orders: -     MM 3D DIAGNOSTIC MAMMOGRAM UNILATERAL RIGHT BREAST; Future  Bilateral plantar fasciitis Assessment & Plan: Referred to sports medicine.  Has seen podiatry in the past and has been fitted for orthotics that did not help.  Discussed calf stretching and toe extension stretching exercises.  Recommended against tight fitting shoes or sandals with poor arch support/heel support.      Return if symptoms worsen or fail to improve.  Sandre Kitty, MD

## 2023-05-18 NOTE — Patient Instructions (Addendum)
It was nice to see you today,  We addressed the following topics today: Your pain in your chest is likely due to something called costochondritis.  This is inflammation of the cartilage.  The treatment is using heat/ice as needed, oral NSAIDs like ibuprofen or naproxen, and not doing things that aggravate it or lifting heavy objects or doing strenuous upper body exercise - You can also do stretches for this..  A simple stretch you can do is to stand in a doorway and place the affected arm against the door frame and stretch your body in a way that stretches your chest muscle.  Do this for 30 seconds 3 times a day. - I will send you a referral for sports medicine for this and your plantar fasciitis pain. - For your plantar fasciitis, stretching is also helpful.  You should also wear good fitting shoes that have arch support. - I have ordered a mammogram.  Someone will call you to schedule this.  Have a great day,  Frederic Jericho, MD

## 2023-05-28 ENCOUNTER — Other Ambulatory Visit: Payer: Self-pay | Admitting: Family Medicine

## 2023-05-28 DIAGNOSIS — N644 Mastodynia: Secondary | ICD-10-CM

## 2023-06-01 ENCOUNTER — Encounter: Payer: Self-pay | Admitting: Family Medicine

## 2023-06-01 ENCOUNTER — Ambulatory Visit (INDEPENDENT_AMBULATORY_CARE_PROVIDER_SITE_OTHER): Payer: Medicaid Other | Admitting: Family Medicine

## 2023-06-01 VITALS — BP 142/85 | Ht 63.0 in | Wt 240.0 lb

## 2023-06-01 DIAGNOSIS — M722 Plantar fascial fibromatosis: Secondary | ICD-10-CM | POA: Diagnosis present

## 2023-06-01 MED ORDER — MELOXICAM 15 MG PO TABS: ORAL_TABLET | ORAL | 0 refills | Status: DC

## 2023-06-01 NOTE — Progress Notes (Signed)
Shannon Foley - 37 y.o. female MRN [id number]  Date of birth: 1985/11/27  PCP: Melida Quitter, PA  Subjective:  No chief complaint on file. Bilateral plantar fasciitis  HPI: Past Medical, Surgical, Social, and Family History Reviewed & Updated per EMR.   Patient is a 37 y.o. female here for severity: Moderate, timing: constant, unchanged, localized, pain located on the medial proximal aspect of her calcaneus and the soft tissue, that is consistent with her previous plantar fascial pain that started years ago and responded well to steroid injections but slowly returned. It is exacerbated in the morning when standing up and after prolonged standing during the day at work. The patient has tried no stretching or directed therapies at home.    Past Medical History:  Diagnosis Date   Back pain    mid and lower back   Chronic abdominal pain    Chronic constipation    Endometrial polyp    Fibroids    one submucosal and one intramural (both  < 1 inch in size)   Headache    caused by pituitary cyst   IBS (irritable bowel syndrome)    Missed abortion    Morbid obesity with BMI of 40.0-44.9, adult (HCC)    Motion sickness cars   Pituitary cyst (HCC)    Rathke's pouch cyst   Vitamin B12 deficiency    Vitamin D deficiency     Current Outpatient Medications on File Prior to Visit  Medication Sig Dispense Refill   ibuprofen (ADVIL) 600 MG tablet Take 1 tablet (600 mg total) by mouth every 6 (six) hours as needed. 60 tablet 0   Vitamin D, Ergocalciferol, (DRISDOL) 1.25 MG (50000 UNIT) CAPS capsule TAKE 1 CAPSULE BY MOUTH 1 TIME A WEEK (Patient not taking: Reported on 05/18/2023) 4 capsule 1   No current facility-administered medications on file prior to visit.    Past Surgical History:  Procedure Laterality Date   COLONOSCOPY     CYSTOSCOPY  05/10/2016   WNL   DILATION AND EVACUATION N/A 04/13/2020   Procedure: SUCTION DILATION AND CURETTAGE;  Surgeon: Vena Austria, MD;   Location: [facility];  Service: Gynecology;  Laterality: N/A;   ESOPHAGOGASTRODUODENOSCOPY (EGD) WITH PROPOFOL N/A 02/18/2016   Procedure: ESOPHAGOGASTRODUODENOSCOPY (EGD) WITH PROPOFOL;  Surgeon: Midge Minium, MD;  Location: [facility];  Service: Endoscopy;  Laterality: N/A;   HYSTEROSCOPY WITH D & C N/A 12/11/2022   Procedure: HYSTEROSCOPY D&C;  Surgeon: Linzie Collin, MD;  Location: [facility];  Service: Gynecology;  Laterality: N/A;   TONSILLECTOMY  2015    No Known Allergies      Objective:  Physical Exam: VS: BP:(!) 142/85  HR: bpm  TEMP: ( )  RESP:   HT:5\' 3"  (160 cm)   WT:240 lb (108.9 kg)  BMI:42.52  Gen: NAD, speaks clearly, comfortable in exam room Respiratory: normal work of breathing on room air Skin: No rashes, abrasions, or ecchymosis MSK: Inspection of the bilateral feet shows no deformities, edema, but there is a moderate pes planus formation that worsens when standing.  The patient is wearing crocs. Transverse arch: preserved Callus pattern: Small callus formation over the lateral fifth digit bilaterally Navicular NT, Cuboid: NT Calcaneous: ttp: non TTP  Plantar Fascia: Tender to palpation over the medial proximal point, Fat Pad: NT Calf muscles with marked tightness on dorsiflexion passively Achilles: NT Peroneals: NT Post Tib: NT 5th MT: Nontender 1st TMT: NT, no deformity, edema or erythema Other foot breakdown: none Hindfoot  breakdown: none Sensation: intact      Assessment & Plan:   Plantar fasciitis - The patient responded well to steroid therapy years ago but has not done any consistent stretching or directed therapies on her own. - We discussed eccentric loading stretches for calf tightness and crossover symptoms. - She can also start ice massage therapy of the area - I will send in some meloxicam for her to use short-term. - We discussed the use of more supportive footwear including commercial orthotics with arch support - If she is  consistent with these and is not showing improvement she can try [place] socks or night splints as an option. - We will follow-up with her in 6 weeks.  If there is no improvement we can also consider shockwave therapy.    Rica Mote MD [facility] Sports Medicine Fellow  Addendum:  Patient seen in the office by fellow.  His history, exam, plan of care were precepted with me.  Norton Blizzard MD Marrianne Mood

## 2023-06-01 NOTE — Assessment & Plan Note (Signed)
-   The patient responded well to steroid therapy years ago but has not done any consistent stretching or directed therapies on her own. - We discussed eccentric loading stretches for calf tightness and crossover symptoms. - She can also start ice massage therapy of the area - I will send in some meloxicam for her to use short-term. - We discussed the use of more supportive footwear including commercial orthotics with arch support - If she is consistent with these and is not showing improvement she can try [place] socks or night splints as an option. - We will follow-up with her in 6 weeks.  If there is no improvement we can also consider shockwave therapy.

## 2023-06-08 ENCOUNTER — Encounter: Payer: Self-pay | Admitting: Obstetrics and Gynecology

## 2023-06-13 NOTE — Telephone Encounter (Signed)
Spoke with Dr. Logan Bores. He offered a TSH/FSH or to wait another month to see if her cycle returns. Spoke with patient to confirm the medication was stopped in July. At this time she would like to wait one more month before lab work. She will call back in one month if she has not started her cycle.

## 2023-06-22 ENCOUNTER — Other Ambulatory Visit: Payer: Self-pay | Admitting: Nurse Practitioner

## 2023-06-22 ENCOUNTER — Ambulatory Visit
Admission: RE | Admit: 2023-06-22 | Discharge: 2023-06-22 | Disposition: A | Discharge status: Home / Self Care | Payer: Medicaid Other | Source: Ambulatory Visit | Attending: Family Medicine

## 2023-06-22 ENCOUNTER — Other Ambulatory Visit: Payer: Self-pay | Admitting: Family Medicine

## 2023-06-22 DIAGNOSIS — N644 Mastodynia: Secondary | ICD-10-CM

## 2023-06-22 DIAGNOSIS — N6310 Unspecified lump in the right breast, unspecified quadrant: Secondary | ICD-10-CM

## 2023-06-29 ENCOUNTER — Ambulatory Visit
Admission: RE | Admit: 2023-06-29 | Discharge: 2023-06-29 | Disposition: A | Discharge status: Home / Self Care | Payer: Medicaid Other | Source: Ambulatory Visit | Attending: Family Medicine | Admitting: Family Medicine

## 2023-06-29 DIAGNOSIS — N6310 Unspecified lump in the right breast, unspecified quadrant: Secondary | ICD-10-CM

## 2023-06-29 HISTORY — PX: BREAST BIOPSY: SHX20

## 2023-07-02 LAB — SURGICAL PATHOLOGY

## 2023-07-11 ENCOUNTER — Ambulatory Visit: Payer: Medicaid Other | Admitting: Family Medicine

## 2023-07-13 ENCOUNTER — Encounter: Payer: Self-pay | Admitting: Family Medicine

## 2023-07-13 ENCOUNTER — Ambulatory Visit: Payer: Medicaid Other | Admitting: Family Medicine

## 2023-07-13 VITALS — BP 132/96 | Ht 63.0 in | Wt 245.0 lb

## 2023-07-13 DIAGNOSIS — M722 Plantar fascial fibromatosis: Secondary | ICD-10-CM

## 2023-07-13 DIAGNOSIS — M67979 Unspecified disorder of synovium and tendon, unspecified ankle and foot: Secondary | ICD-10-CM | POA: Insufficient documentation

## 2023-07-13 NOTE — Assessment & Plan Note (Signed)
-   The patient reports a 70% improvement on her plantar fasciitis. - We will have her continue her daily stretches and icing to include as part of her daily regimen - Temporary sports orthotics fitted today with medial heel wedge which I believe will give her some more relief.

## 2023-07-13 NOTE — Assessment & Plan Note (Addendum)
-   It is likely that by treating Shannon Foley's plantar fasciitis and increasing flexibility in the plantar fascia and forefoot we have uncovered the effects of her ankle pronation. - Her footwear offers no ankle support and very little cushioning which is likely worsening her dynamic pronation. - She was fitted with temporary sports orthotics with a medial heel wedge bilaterally today.  She reports marked benefit from this. - We will have her wear these for the next 1 to 2 months.  If she feels like she is receiving marked benefit she can schedule a follow-up for custom orthotics - I stressed the importance of wearing supportive footwear. - The patient voiced understanding and agreement with plan.

## 2023-07-13 NOTE — Progress Notes (Unsigned)
Shannon Foley - 37 y.o. female MRN [id number]  Date of birth: 12/16/1985  PCP: Melida Quitter, PA  Subjective:  No chief complaint on file.  Plantar fasciitis  HPI: Past Medical, Surgical, Social, and Family History Reviewed & Updated per EMR.   Patient is a 37 y.o. female here for follow up on plantar fasciitis. She has been doing the stretches with ice and massage consistently. She reports 70% improvement in pain but is now having pain on the medial arch of both feet. There has been no swelling, injury, numbness or weakness. She has been wearing converse and bought some soft gel inserts OTC.   Past Medical History:  Diagnosis Date   Back pain    mid and lower back   Chronic abdominal pain    Chronic constipation    Endometrial polyp    Fibroids    one submucosal and one intramural (both  < 1 inch in size)   Headache    caused by pituitary cyst   IBS (irritable bowel syndrome)    Missed abortion    Morbid obesity with BMI of 40.0-44.9, adult (HCC)    Motion sickness cars   Pituitary cyst (HCC)    Rathke's pouch cyst   Vitamin B12 deficiency    Vitamin D deficiency     Current Outpatient Medications on File Prior to Visit  Medication Sig Dispense Refill   ibuprofen (ADVIL) 600 MG tablet Take 1 tablet (600 mg total) by mouth every 6 (six) hours as needed. 60 tablet 0   meloxicam (MOBIC) 15 MG tablet Take 1 tablet daily with food for 7 days. Then take as needed. 40 tablet 0   Vitamin D, Ergocalciferol, (DRISDOL) 1.25 MG (50000 UNIT) CAPS capsule TAKE 1 CAPSULE BY MOUTH 1 TIME A WEEK (Patient not taking: Reported on 05/18/2023) 4 capsule 1   No current facility-administered medications on file prior to visit.    Past Surgical History:  Procedure Laterality Date   BREAST BIOPSY Right 06/29/2023   [place] RT BREAST BX W LOC DEV 1ST LESION IMG BX SPEC US GUIDE 06/29/2023 [facility]   COLONOSCOPY     CYSTOSCOPY  05/10/2016   WNL   DILATION AND EVACUATION N/A  04/13/2020   Procedure: SUCTION DILATION AND CURETTAGE;  Surgeon: Vena Austria, MD;  Location: [facility];  Service: Gynecology;  Laterality: N/A;   ESOPHAGOGASTRODUODENOSCOPY (EGD) WITH PROPOFOL N/A 02/18/2016   Procedure: ESOPHAGOGASTRODUODENOSCOPY (EGD) WITH PROPOFOL;  Surgeon: Midge Minium, MD;  Location: [facility];  Service: Endoscopy;  Laterality: N/A;   HYSTEROSCOPY WITH D & C N/A 12/11/2022   Procedure: HYSTEROSCOPY D&C;  Surgeon: Linzie Collin, MD;  Location: [facility];  Service: Gynecology;  Laterality: N/A;   TONSILLECTOMY  2015    No Known Allergies      Objective:  Physical Exam: VS: BP:(!) 132/96  HR: bpm  TEMP: ( )  RESP:   HT:5\' 3"  (160 cm)   WT:245 lb (111.1 kg)  BMI:43.41  Gen: NAD, speaks clearly, comfortable in exam room Respiratory: Normal respiratory effort on room air. No signs of distress Skin: No rashes, abrasions, or ecchymosis MSK: inspection of the ankles bilat while standing shows some pronation of the ankles bilat  ROM of passive IR and ER of forefoot: normal Long arch: preserved Transverse arch: preserved Talar dome NT, Navicular NT, Cuboid: NT Calcaneous: NT w/out edema at the origin of the plantar fasciitis bilat Peroneals: NT Post Tib: no edema but TTP over the insertion  point and proximal to the inferior portion of the distal malleolus bilat    Assessment & Plan:   Bilateral plantar fasciitis - The patient reports a 70% improvement on her plantar fasciitis. - We will have her continue her daily stretches and icing to include as part of her daily regimen - Temporary sports orthotics fitted today with medial heel wedge which I believe will give her some more relief.  Tibialis posterior tendinopathy - It is likely that by treating Shannon Foley's plantar fasciitis and increasing flexibility in the plantar fascia and forefoot we have uncovered the effects of her ankle pronation. - Her footwear offers no ankle support and very little  cushioning which is likely worsening her dynamic pronation. - She was fitted with temporary sports orthotics with a medial heel wedge bilaterally today.  She reports marked benefit from this. - We will have her wear these for the next 1 to 2 months.  If she feels like she is receiving marked benefit she can schedule a follow-up for custom orthotics - I stressed the importance of wearing supportive footwear. - The patient voiced understanding and agreement with plan.     Rica Mote MD [facility] Sports Medicine Fellow

## 2023-07-15 NOTE — Progress Notes (Signed)
[facility]: Attending Note: I have examined the patient, reviewed the chart, discussed the assessment and plan with the Sports Medicine Fellow. I agree with assessment and treatment plan as detailed in the Fellow's note. Plantar fasciitis and posterior tibialis tendonopathy: improved with HEP. Adding modified shoe inserts and f/u in 4 weeks or so. Could consider custom molded inserts.

## 2023-08-03 ENCOUNTER — Encounter: Payer: Medicaid Other | Admitting: Family Medicine

## 2023-08-07 ENCOUNTER — Encounter: Payer: Self-pay | Admitting: Obstetrics and Gynecology

## 2023-08-08 ENCOUNTER — Encounter: Payer: Self-pay | Admitting: Family Medicine

## 2023-08-29 ENCOUNTER — Ambulatory Visit: Payer: Medicaid Other | Admitting: Obstetrics and Gynecology

## 2023-08-29 ENCOUNTER — Encounter: Payer: Self-pay | Admitting: Obstetrics and Gynecology

## 2023-08-29 VITALS — BP 118/79 | HR 96 | Ht 63.0 in | Wt 252.3 lb

## 2023-08-29 DIAGNOSIS — N912 Amenorrhea, unspecified: Secondary | ICD-10-CM | POA: Diagnosis not present

## 2023-08-29 MED ORDER — DESOGESTREL-ETHINYL ESTRADIOL 0.15-0.02/0.01 MG (21/5) PO TABS
1.0000 | ORAL_TABLET | Freq: Every day | ORAL | 0 refills | Status: DC
Start: 2023-08-29 — End: 2023-09-28

## 2023-08-29 NOTE — Progress Notes (Signed)
HPI:      [name] is a 37 y.o. G1P0010 who LMP was No LMP recorded (lmp unknown).  Subjective:   She presents today stating that she still has not had her first menstrual period since stopping the Megace 4 months ago.  She had an endometrial polyp that had simple hyperplasia and was removed at [facility].  She took Megace April May and June. She desires fertility in the near future.  She does not have a partner and plans artificial insemination.    Hx: The following portions of the patient's history were reviewed and updated as appropriate:             She  has a past medical history of Back pain, Chronic abdominal pain, Chronic constipation, Endometrial polyp, Fibroids, Headache, IBS (irritable bowel syndrome), Missed abortion, Morbid obesity with BMI of 40.0-44.9, adult (HCC), Motion sickness (cars), Pituitary cyst (HCC), Vitamin B12 deficiency, and Vitamin D deficiency. She does not have any pertinent problems on file. She  has a past surgical history that includes Tonsillectomy (2015); Esophagogastroduodenoscopy (egd) with propofol (N/A, 02/18/2016); Cystoscopy (05/10/2016); Colonoscopy; Dilation and evacuation (N/A, 04/13/2020); Hysteroscopy with D & C (N/A, 12/11/2022); and Breast biopsy (Right, 06/29/2023). Her family history includes Bladder Cancer in her paternal aunt; CVA in her father; Congestive Heart Failure in her mother; Diabetes in her father and mother; Hypertension in her father and mother. She  reports that she has never smoked. She has never been exposed to tobacco smoke. She has never used smokeless tobacco. She reports that she does not drink alcohol and does not use drugs. She has a current medication list which includes the following prescription(s): desogestrel-ethinyl estradiol, ibuprofen, and vitamin d (ergocalciferol). She has No Known Allergies.       Review of Systems:  Review of Systems  Constitutional: Denied constitutional symptoms, night sweats, recent  illness, fatigue, fever, insomnia and weight loss.  Eyes: Denied eye symptoms, eye pain, photophobia, vision change and visual disturbance.  Ears/Nose/Throat/Neck: Denied ear, nose, throat or neck symptoms, hearing loss, nasal discharge, sinus congestion and sore throat.  Cardiovascular: Denied cardiovascular symptoms, arrhythmia, chest pain/pressure, edema, exercise intolerance, orthopnea and palpitations.  Respiratory: Denied pulmonary symptoms, asthma, pleuritic pain, productive sputum, cough, dyspnea and wheezing.  Gastrointestinal: Denied, gastro-esophageal reflux, melena, nausea and vomiting.  Genitourinary: See HPI for additional information.  Musculoskeletal: Denied musculoskeletal symptoms, stiffness, swelling, muscle weakness and myalgia.  Dermatologic: Denied dermatology symptoms, rash and scar.  Neurologic: Denied neurology symptoms, dizziness, headache, neck pain and syncope.  Psychiatric: Denied psychiatric symptoms, anxiety and depression.  Endocrine: Denied endocrine symptoms including hot flashes and night sweats.   Meds:   Current Outpatient Medications on File Prior to Visit  Medication Sig Dispense Refill   ibuprofen (ADVIL) 600 MG tablet Take 1 tablet (600 mg total) by mouth every 6 (six) hours as needed. 60 tablet 0   Vitamin D, Ergocalciferol, (DRISDOL) 1.25 MG (50000 UNIT) CAPS capsule TAKE 1 CAPSULE BY MOUTH 1 TIME A WEEK 4 capsule 1   No current facility-administered medications on file prior to visit.      Objective:     Vitals:   08/29/23 1325  BP: 118/79  Pulse: 96   Filed Weights   08/29/23 1325  Weight: 252 lb 4.8 oz (114.4 kg)                        Assessment:    G1P0010 Patient Active Problem List  Diagnosis Date Noted   Tibialis posterior tendinopathy 07/13/2023   Breast pain 05/18/2023   Vitamin D deficiency 07/13/2022   Impaired fasting glucose 07/13/2022   BMI 40.0-44.9, adult (HCC) 06/11/2022   Bacterial vaginitis 08/02/2021    Recurrent pansinusitis 10/08/2020   Abnormal biochemical finding on antenatal screening of mother 04/01/2020   Vaginal candida 03/18/2019   CIN I (cervical intraepithelial neoplasia I) 02/13/2019   Acute otitis media 01/06/2019   Acute upper respiratory infection 12/08/2018   Fever and chills 12/08/2018   Sore throat 12/08/2018   Hiatal hernia 06/23/2018   Left hip pain 06/23/2018   Needs flu shot 06/23/2018   Uterine fibroid 06/13/2018   IBS (irritable bowel syndrome) 05/29/2018   Low grade squamous intraepithelial lesion (LGSIL) on cervical Pap smear 05/27/2018   Encounter for general adult medical examination with abnormal findings 04/03/2018   Gastroesophageal reflux disease without esophagitis 04/03/2018   Chronic midline low back pain without sciatica 04/03/2018   Chronic midline thoracic back pain 04/03/2018   Urinary tract infection without hematuria 04/03/2018   Dysuria 04/03/2018   Chest pain 01/08/2018   Epigastric abdominal pain 01/08/2018   Abnormal findings-gastrointestinal tract    Rathke's cleft cyst (HCC) 02/02/2016   Anemia 04/29/2014   Fatigue 04/29/2014   Hyperprolactinemia (HCC) 04/29/2014   Vitamin B 12 deficiency 04/29/2014   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 Amenorrhea     Probably secondary to Megace use.   Plan:            1.  OCPs for 2 months.  I expect her to cycle regularly and then I expect her normal cycle to return. If either of these things does not happen patient will contact us for further hormonal testing (PCO labs)  OCPs The risks /benefits of OCPs have been explained to the  patient in detail.  Product literature has been given to her where appropriate.  I have instructed her in the use of OCPs.  I have explained to the patient that OCPs are not as effective for birth control during the first month of use, and that another form of contraception should be used during this time.  Both first-day start and Sunday start have been explained.  The risks and benefits of each was discussed.  She has been made aware of  the fact that in rare circumstances, other medications may affect the efficacy of OCPs.  I have answered all of her questions, and I believe that she has an understanding of the effectiveness and use of OCPs.  Orders No orders of the defined types were placed in this encounter.    Meds ordered this encounter  Medications   desogestrel-ethinyl estradiol (MIRCETTE) 0.15-0.02/0.01 MG (21/5) tablet    Sig: Take 1 tablet by mouth at bedtime.    Dispense:  56 tablet    Refill:  0      F/U  No follow-ups on file. I spent 22 minutes involved in the care of this patient preparing to see the patient by obtaining and reviewing her medical history (including labs, imaging tests and prior procedures), documenting clinical information in the electronic health record (EHR), counseling  and coordinating care plans, writing and sending prescriptions, ordering tests or procedures and in direct communicating with the patient and medical staff discussing pertinent items from her history and physical exam.  Elonda Husky, M.D. 08/29/2023 2:01 PM

## 2023-08-29 NOTE — Progress Notes (Signed)
Patient presents today due to no menstrual cycle since April. She had a D&C in April due to a polyp and abnormal bleeding.

## 2023-08-31 ENCOUNTER — Ambulatory Visit: Payer: Medicaid Other | Admitting: Obstetrics and Gynecology

## 2023-09-23 ENCOUNTER — Encounter: Payer: Self-pay | Admitting: Obstetrics and Gynecology

## 2023-09-26 NOTE — Progress Notes (Signed)
 [facility] Neurology Clinic Summary    [facility] [address] [address] [place] [address] [phone]  Date: 09/26/2023 Patient Name: Shannon Foley MRN: [id number] PCP: Shannon Foley Mushtaq Shannon Foley*         [name] is a 38 y.o. right handed female seen in consultation at the [facility] Neurology Outpatient Clinics at the request of Dr. Martyn for evaluation.      Subjective:  Subjective      HPI: Patient is a 38 y.o. female seen at the request of Shannon Foley*.  Headaches - suffered for about 10 years - she gets 3 headaches per week - last hours at time  - the intensity is moderate - notable at the top of the head and back of the head - described as pressure - no nausea or or vomiting - lights and sounds do not bother her - no numbness or tingling, no visual auras associated to these headaches - motrin  sometimes helps the headaches       Past Medical Hx, Family Hx, Social Hx, and Problem List has been reviewed in the [employer].    REVIEW OF SYSTEMS: ROS     Objective:     Physical Exam: Blood pressure 130/93, pulse 83, height 163 cm (5' 4.17), weight (!) 114.1 kg (251 lb 9.6 oz), last menstrual period 07/02/2023, not currently breastfeeding.    Physical Exam     Neurological Exam   Awake, alert, oriented x 3 PERRL, EOM intact, visual fields intact No tongue or palate deviation, no facial asymmetry No facial sensory deficit.  Trapezius 5/5   Deltoids 5/5 Biceps 5/5 Triceps 5/5 Wrist extension 5/5 Wrist flexion 5/5 Finger flexors 5/5 Fingers extensors 5/5 Hip flexion 5/5 Knee flexion 5/5 Knee extension 5/5 Foot dorsiflexon 5/5 Foot eversion, inversion 5/5 Foot plantarflexion 5/5  DTR Brachioradialis 2/2 Triceps 2/2 Biceps 2/2 Knee 2/2 Ankles 2/2  Normal vibration  and pinprick.   Babinski absent No crossed adductors  Normal finger to nose  Normal romberg  Normal gait        MRI Brain : Impression  Unchanged 3 mm Rathke's cleft cyst, which has been stable since 2015. No further follow-up is needed.         Assessment & Plan:  Assessment  [name] is a 38 y.o. right handed female seen in consultation for the following problems.     Diagnosis ICD-10-CM Associated Orders  1. Chronic tension-type headache, intractable  G44.221 Ambulatory referral to Neurology     Not associated to issues with pituitary or brain cyst No red flags for further imaging or spinal tap Examination is normal  We will start with amitriptyline 10 mg nightly x 2 weeks, then up to 20 mg nightly.   To follow up in 3 months with Shannon Foley.    Thank you very much for this consultation. Please let us  know if any questions or concerns.   Sincerely, Sula LITTIE Morrie Terra, MD   CC: Shannon Foley*

## 2023-09-28 ENCOUNTER — Ambulatory Visit (INDEPENDENT_AMBULATORY_CARE_PROVIDER_SITE_OTHER): Payer: Medicaid Other | Admitting: Family Medicine

## 2023-09-28 ENCOUNTER — Encounter: Payer: Self-pay | Admitting: Family Medicine

## 2023-09-28 VITALS — BP 129/84 | HR 83 | Temp 98.8°F | Ht 63.0 in | Wt 249.5 lb

## 2023-09-28 DIAGNOSIS — Z6841 Body Mass Index (BMI) 40.0 and over, adult: Secondary | ICD-10-CM | POA: Diagnosis not present

## 2023-09-28 DIAGNOSIS — Z0000 Encounter for general adult medical examination without abnormal findings: Secondary | ICD-10-CM

## 2023-09-28 DIAGNOSIS — E559 Vitamin D deficiency, unspecified: Secondary | ICD-10-CM | POA: Diagnosis not present

## 2023-09-28 DIAGNOSIS — R1030 Lower abdominal pain, unspecified: Secondary | ICD-10-CM | POA: Diagnosis not present

## 2023-09-28 DIAGNOSIS — E538 Deficiency of other specified B group vitamins: Secondary | ICD-10-CM

## 2023-09-28 DIAGNOSIS — R8290 Unspecified abnormal findings in urine: Secondary | ICD-10-CM

## 2023-09-28 DIAGNOSIS — E611 Iron deficiency: Secondary | ICD-10-CM

## 2023-09-28 DIAGNOSIS — R7301 Impaired fasting glucose: Secondary | ICD-10-CM | POA: Diagnosis not present

## 2023-09-28 LAB — POCT URINALYSIS DIPSTICK
Bilirubin, UA: NEGATIVE
Glucose, UA: NEGATIVE
Ketones, UA: NEGATIVE
Leukocytes, UA: NEGATIVE
Nitrite, UA: NEGATIVE
Protein, UA: NEGATIVE
Spec Grav, UA: >=1.03 — AB (ref 1.010–1.025)
Urobilinogen, UA: 0.2 U/dL
pH, UA: 6.5 (ref 5.0–8.0)

## 2023-09-28 NOTE — Patient Instructions (Addendum)
 I will send you a message on MyChart once we have your lab results!  Depending on the results of your urine test and labs, if we do need to do an ultrasound of your abdomen I will likely have you follow-up within the next few months.  In the meantime, we can schedule your annual physical for next year.

## 2023-09-28 NOTE — Progress Notes (Signed)
 Complete physical exam  Patient: Shannon Foley   DOB: 1986/08/14   38 y.o. Female  MRN: [id number]  Subjective:    Chief Complaint  Patient presents with   Annual Exam    Shannon Foley is a 38 y.o. female who presents today for a complete physical exam. She reports consuming a general diet. She generally feels fairly well. She reports sleeping well. She has been experiencing lower abdominal pain about 5 days.  Denies nausea, vomiting, fever, chills, constipation, diarrhea, dysuria.  She does endorse malodorous urine and would like her urine checked today.  She does not have any concern for STIs.  She is established with OB/GYN and notes that she did start a new OCP about 3 weeks ago. She would also like PCP to fill out intermediate FMLA paperwork because symptoms due to pituitary cyst are interfering with ability to work: Lightheadedness, intense headache, fatigue, back pains, weight gain.   Most recent fall risk assessment:    09/28/2023    8:36 AM  Fall Risk   Falls in the past year? 0  Number falls in past yr: 0  Injury with Fall? 0  Risk for fall due to : History of fall(s)  Follow up Falls evaluation completed     Most recent depression and anxiety screenings:    09/28/2023    8:36 AM 10/26/2022   11:12 AM  PHQ 2/9 Scores  PHQ - 2 Score 0 0  PHQ- 9 Score 2 1      09/28/2023    8:37 AM 10/26/2022   11:13 AM 06/23/2022   10:28 AM 05/26/2022   10:52 AM  GAD 7 : Generalized Anxiety Score  Nervous, Anxious, on Edge 0 0 0 0  Control/stop worrying 0 0 0 1  Worry too much - different things 0 0 0 1  Trouble relaxing 0 0 0 1  Restless 0 0 0 0  Easily annoyed or irritable 0 0 0 1  Afraid - awful might happen 0 0 0 0  Total GAD 7 Score 0 0 0 4  Anxiety Difficulty  Not difficult at all      Patient Active Problem List   Diagnosis Date Noted   Tibialis posterior tendinopathy 07/13/2023   Vitamin D  deficiency 07/13/2022   Impaired fasting glucose 07/13/2022   BMI  40.0-44.9, adult (HCC) 06/11/2022   Recurrent pansinusitis 10/08/2020   Hiatal hernia 06/23/2018   Uterine fibroid 06/13/2018   IBS (irritable bowel syndrome) 05/29/2018   Gastroesophageal reflux disease without esophagitis 04/03/2018   Chronic midline low back pain without sciatica 04/03/2018   Chronic midline thoracic back pain 04/03/2018   Rathke's cleft cyst (HCC) 02/02/2016   Hyperprolactinemia (HCC) 04/29/2014   Vitamin B 12 deficiency 04/29/2014   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 04/29/2014   Pituitary cyst (HCC) 02/19/2014   Dupuytren's contracture of foot 12/18/2013   Plantar fasciitis 12/18/2013   Calcaneal bursitis 12/18/2013   Bilateral plantar fasciitis 12/18/2013    Past Surgical History:  Procedure Laterality Date   BREAST BIOPSY Right 06/29/2023   US  RT BREAST BX W LOC DEV 1ST LESION IMG BX SPEC US  GUIDE 06/29/2023 [facility] MAMMOGRAPHY   COLONOSCOPY     CYSTOSCOPY  05/10/2016   WNL   DILATION AND EVACUATION N/A 04/13/2020   Procedure: SUCTION DILATION AND CURETTAGE;  Surgeon: Lake Read, MD;  Location: [facility] ORS;  Service: Gynecology;  Laterality: N/A;   ESOPHAGOGASTRODUODENOSCOPY (EGD) WITH PROPOFOL  N/A 02/18/2016  Procedure: ESOPHAGOGASTRODUODENOSCOPY (EGD) WITH PROPOFOL ;  Surgeon: Rogelia Copping, MD;  Location: [facility];  Service: Endoscopy;  Laterality: N/A;   HYSTEROSCOPY WITH D & C N/A 12/11/2022   Procedure: HYSTEROSCOPY D&C;  Surgeon: Janit Alm Agent, MD;  Location: [facility] ORS;  Service: Gynecology;  Laterality: N/A;   TONSILLECTOMY  2015   Social History   Tobacco Use   Smoking status: Never    Passive exposure: Never   Smokeless tobacco: Never  Vaping Use   Vaping status: Never Used  Substance Use Topics   Alcohol use: No   Drug use: No   Family History  Problem Relation Age of Onset   Diabetes Mother    Hypertension Mother    Congestive Heart Failure Mother    CVA Father        Had  meningioma   Diabetes Father    Hypertension Father    Bladder Cancer Paternal Aunt    Colon cancer Neg Hx    Esophageal cancer Neg Hx    Stomach cancer Neg Hx    Rectal cancer Neg Hx    No Known Allergies   Patient Care Team: Wallace Joesph LABOR, PA as PCP - General (Family Medicine)   Outpatient Medications Prior to Visit  Medication Sig   [DISCONTINUED] desogestrel -ethinyl estradiol  (MIRCETTE ) 0.15-0.02/0.01 MG (21/5) tablet Take 1 tablet by mouth at bedtime. (Patient not taking: Reported on 09/28/2023)   [DISCONTINUED] ibuprofen  (ADVIL ) 600 MG tablet Take 1 tablet (600 mg total) by mouth every 6 (six) hours as needed. (Patient not taking: Reported on 09/28/2023)   [DISCONTINUED] Vitamin D , Ergocalciferol , (DRISDOL ) 1.25 MG (50000 UNIT) CAPS capsule TAKE 1 CAPSULE BY MOUTH 1 TIME A WEEK (Patient not taking: Reported on 09/28/2023)   No facility-administered medications prior to visit.    Review of Systems  Constitutional:  Negative for chills, fever and malaise/fatigue.  HENT:  Negative for congestion and hearing loss.   Eyes:  Negative for blurred vision and double vision.  Respiratory:  Negative for cough and shortness of breath.   Cardiovascular:  Negative for chest pain, palpitations and leg swelling.  Gastrointestinal:  Positive for abdominal pain. Negative for blood in stool, constipation, diarrhea, heartburn and melena.  Genitourinary:  Negative for frequency and urgency.  Musculoskeletal:  Negative for myalgias and neck pain.  Neurological:  Negative for headaches.  Endo/Heme/Allergies:  Negative for polydipsia.  Psychiatric/Behavioral:  Negative for depression. The patient is not nervous/anxious and does not have insomnia.       Objective:    BP 129/84   Pulse 83   Temp 98.8 F (37.1 C) (Oral)   Ht 5' 3 (1.6 m)   Wt 249 lb 8 oz (113.2 kg)   LMP  (LMP Unknown)   BMI 44.20 kg/m    Physical Exam Constitutional:      General: She is not in acute distress.     Appearance: Normal appearance.  HENT:     Head: Normocephalic and atraumatic.     Right Ear: Tympanic membrane, ear canal and external ear normal.     Left Ear: Tympanic membrane, ear canal and external ear normal.     Nose: Nose normal.     Mouth/Throat:     Mouth: Mucous membranes are moist.     Pharynx: No oropharyngeal exudate or posterior oropharyngeal erythema.  Eyes:     Extraocular Movements: Extraocular movements intact.     Conjunctiva/sclera: Conjunctivae normal.     Pupils: Pupils are equal, round, and  reactive to light.     Comments: Does not wear glasses or contacts  Neck:     Thyroid : No thyroid  mass, thyromegaly or thyroid  tenderness.  Cardiovascular:     Rate and Rhythm: Normal rate and regular rhythm.     Heart sounds: Normal heart sounds. No murmur heard.    No friction rub. No gallop.  Pulmonary:     Effort: Pulmonary effort is normal. No respiratory distress.     Breath sounds: Normal breath sounds. No wheezing, rhonchi or rales.  Abdominal:     General: Abdomen is flat. Bowel sounds are normal. There is no distension.     Palpations: There is no mass.     Tenderness: There is abdominal tenderness in the right lower quadrant, suprapubic area and left lower quadrant. There is no guarding.  Musculoskeletal:        General: Normal range of motion.     Cervical back: Normal range of motion and neck supple.  Lymphadenopathy:     Cervical: No cervical adenopathy.  Skin:    General: Skin is warm and dry.  Neurological:     Mental Status: She is alert and oriented to person, place, and time.     Cranial Nerves: No cranial nerve deficit.     Motor: No weakness.     Deep Tendon Reflexes: Reflexes normal.  Psychiatric:        Mood and Affect: Mood normal.     Results for orders placed or performed in visit on 09/28/23  POCT urinalysis dipstick  Result Value Ref Range   Color, UA yellow    Clarity, UA slightly cloudy    Glucose, UA Negative Negative    Bilirubin, UA negative    Ketones, UA negative    Spec Grav, UA >=1.030 (A) 1.010 - 1.025   Blood, UA trace-intact    pH, UA 6.5 5.0 - 8.0   Protein, UA Negative Negative   Urobilinogen, UA 0.2 0.2 or 1.0 E.U./dL   Nitrite, UA negative    Leukocytes, UA Negative Negative   Appearance     Odor        Assessment & Plan:    Routine Health Maintenance and Physical Exam  Immunization History  Administered Date(s) Administered   Influenza Inj Mdck Quad Pf 06/14/2018   PFIZER(Purple Top)SARS-COV-2 Vaccination 05/03/2020, 05/31/2020   Tdap 02/17/2019    Health Maintenance  Topic Date Due   COVID-19 Vaccine (3 - 2024-25 season) 05/20/2023   INFLUENZA VACCINE  12/17/2023 (Originally 04/19/2023)   Cervical Cancer Screening (HPV/Pap Cotest)  02/10/2025   DTaP/Tdap/Td (2 - Td or Tdap) 02/16/2029   Hepatitis C Screening  Completed   HIV Screening  Completed   HPV VACCINES  Aged Out    Collecting labs including CBC, CMP, lipid panel, A1C, TSH, and vitamin D .  Declines influenza vaccine.  Otherwise up-to-date on all preventative care.  Discussed health benefits of physical activity, and encouraged her to engage in regular exercise appropriate for her age and condition.  Wellness examination  Vitamin D  deficiency -     VITAMIN D  25 Hydroxy (Vit-D Deficiency, Fractures); Future  Impaired fasting glucose -     Hemoglobin A1c; Future  BMI 40.0-44.9, adult (HCC) -     CBC with Differential/Platelet; Future -     Comprehensive metabolic panel; Future -     Hemoglobin A1c; Future -     Lipid panel; Future -     TSH Rfx on Abnormal to Free T4;  Future -     VITAMIN D  25 Hydroxy (Vit-D Deficiency, Fractures); Future  Vitamin B 12 deficiency -     Vitamin B12; Future  Iron deficiency -     Iron and TIBC; Future  Lower abdominal pain -     POCT urinalysis dipstick -     US  PELVIS (TRANSABDOMINAL ONLY); Future  Malodorous urine -     POCT urinalysis dipstick -     US  PELVIS  (TRANSABDOMINAL ONLY); Future  Urinalysis is negative for UTI, recommend contacting OB/GYN as well as performing abdominal/pelvic ultrasound for further evaluation.  Return in about 1 year (around 09/27/2024) for annual physical, fasting blood work 1 week before.     Joesph DELENA Sear, PA

## 2023-09-29 LAB — CBC WITH DIFFERENTIAL/PLATELET
Basophils Absolute: 0 10*3/uL (ref 0.0–0.2)
Basos: 0 %
EOS (ABSOLUTE): 0.2 10*3/uL (ref 0.0–0.4)
Eos: 2 %
Hematocrit: 43.6 % (ref 34.0–46.6)
Hemoglobin: 14.1 g/dL (ref 11.1–15.9)
Immature Grans (Abs): 0.1 10*3/uL (ref 0.0–0.1)
Immature Granulocytes: 1 %
Lymphocytes Absolute: 2.6 10*3/uL (ref 0.7–3.1)
Lymphs: 28 %
MCH: 28.8 pg (ref 26.6–33.0)
MCHC: 32.3 g/dL (ref 31.5–35.7)
MCV: 89 fL (ref 79–97)
Monocytes Absolute: 0.6 10*3/uL (ref 0.1–0.9)
Monocytes: 7 %
Neutrophils Absolute: 5.8 10*3/uL (ref 1.4–7.0)
Neutrophils: 62 %
Platelets: 477 10*3/uL — ABNORMAL HIGH (ref 150–450)
RBC: 4.9 x10E6/uL (ref 3.77–5.28)
RDW: 14.2 % (ref 11.7–15.4)
WBC: 9.3 10*3/uL (ref 3.4–10.8)

## 2023-09-29 LAB — LIPID PANEL
Chol/HDL Ratio: 2.9 {ratio} (ref 0.0–4.4)
Cholesterol, Total: 201 mg/dL — ABNORMAL HIGH (ref 100–199)
HDL: 70 mg/dL (ref >39)
LDL Chol Calc (NIH): 123 mg/dL — ABNORMAL HIGH (ref 0–99)
Triglycerides: 40 mg/dL (ref 0–149)
VLDL Cholesterol Cal: 8 mg/dL (ref 5–40)

## 2023-09-29 LAB — IRON AND TIBC
Iron Saturation: 16 % (ref 15–55)
Iron: 49 ug/dL (ref 27–159)
Total Iron Binding Capacity: 310 ug/dL (ref 250–450)
UIBC: 261 ug/dL (ref 131–425)

## 2023-09-29 LAB — COMPREHENSIVE METABOLIC PANEL
ALT: 8 [IU]/L (ref 0–32)
AST: 14 [IU]/L (ref 0–40)
Albumin: 4.2 g/dL (ref 3.9–4.9)
Alkaline Phosphatase: 103 [IU]/L (ref 44–121)
BUN/Creatinine Ratio: 16 (ref 9–23)
BUN: 12 mg/dL (ref 6–20)
Bilirubin Total: 0.3 mg/dL (ref 0.0–1.2)
CO2: 22 mmol/L (ref 20–29)
Calcium: 9.2 mg/dL (ref 8.7–10.2)
Chloride: 103 mmol/L (ref 96–106)
Creatinine, Ser: 0.75 mg/dL (ref 0.57–1.00)
Globulin, Total: 3.2 g/dL (ref 1.5–4.5)
Glucose: 84 mg/dL (ref 70–99)
Potassium: 4.7 mmol/L (ref 3.5–5.2)
Sodium: 139 mmol/L (ref 134–144)
Total Protein: 7.4 g/dL (ref 6.0–8.5)
eGFR: 104 mL/min/{1.73_m2} (ref >59)

## 2023-09-29 LAB — HEMOGLOBIN A1C
Est. average glucose Bld gHb Est-mCnc: 120 mg/dL
Hgb A1c MFr Bld: 5.8 % — ABNORMAL HIGH (ref 4.8–5.6)

## 2023-09-29 LAB — VITAMIN D 25 HYDROXY (VIT D DEFICIENCY, FRACTURES): Vit D, 25-Hydroxy: 18.8 ng/mL — ABNORMAL LOW (ref 30.0–100.0)

## 2023-09-29 LAB — VITAMIN B12: Vitamin B-12: 347 pg/mL (ref 232–1245)

## 2023-09-29 LAB — TSH RFX ON ABNORMAL TO FREE T4: TSH: 2.33 u[IU]/mL (ref 0.450–4.500)

## 2023-10-01 ENCOUNTER — Other Ambulatory Visit: Payer: Self-pay | Admitting: Family Medicine

## 2023-10-01 DIAGNOSIS — E559 Vitamin D deficiency, unspecified: Secondary | ICD-10-CM

## 2023-10-01 DIAGNOSIS — D75839 Thrombocytosis, unspecified: Secondary | ICD-10-CM

## 2023-10-01 MED ORDER — VITAMIN D (ERGOCALCIFEROL) 1.25 MG (50000 UNIT) PO CAPS
50000.0000 [IU] | ORAL_CAPSULE | ORAL | 0 refills | Status: DC
Start: 2023-10-01 — End: 2024-01-09

## 2023-10-02 ENCOUNTER — Encounter: Payer: Self-pay | Admitting: Family Medicine

## 2023-10-03 NOTE — Telephone Encounter (Signed)
 Contacted pt and informed her that it looks like it was sent to that office and that if they call her she should be able to request that provider.  Also sent message to referral coordinator to verify that the provider is at that location.

## 2023-10-05 ENCOUNTER — Ambulatory Visit
Admission: RE | Admit: 2023-10-05 | Discharge: 2023-10-05 | Disposition: A | Discharge status: Home / Self Care | Payer: Medicaid Other | Source: Ambulatory Visit | Attending: Family Medicine | Admitting: Family Medicine

## 2023-10-05 DIAGNOSIS — R8290 Unspecified abnormal findings in urine: Secondary | ICD-10-CM

## 2023-10-05 DIAGNOSIS — R1030 Lower abdominal pain, unspecified: Secondary | ICD-10-CM

## 2023-10-08 ENCOUNTER — Encounter: Payer: Self-pay | Admitting: Family Medicine

## 2023-10-10 ENCOUNTER — Inpatient Hospital Stay: Payer: Medicaid Other | Admitting: Internal Medicine

## 2023-10-10 ENCOUNTER — Inpatient Hospital Stay: Payer: Medicaid Other

## 2023-10-13 ENCOUNTER — Encounter: Payer: Self-pay | Admitting: Family Medicine

## 2023-10-16 ENCOUNTER — Encounter: Payer: Self-pay | Admitting: Family Medicine

## 2023-10-19 ENCOUNTER — Inpatient Hospital Stay: Payer: Medicaid Other | Attending: Internal Medicine | Admitting: Internal Medicine

## 2023-10-19 ENCOUNTER — Encounter: Payer: Self-pay | Admitting: Internal Medicine

## 2023-10-19 ENCOUNTER — Inpatient Hospital Stay: Payer: Medicaid Other

## 2023-10-19 VITALS — BP 145/90 | HR 85 | Temp 98.6°F | Ht 63.0 in | Wt 246.6 lb

## 2023-10-19 DIAGNOSIS — E559 Vitamin D deficiency, unspecified: Secondary | ICD-10-CM

## 2023-10-19 DIAGNOSIS — D75839 Thrombocytosis, unspecified: Secondary | ICD-10-CM | POA: Diagnosis present

## 2023-10-19 LAB — CBC WITH DIFFERENTIAL/PLATELET
Abs Immature Granulocytes: 0.04 10*3/uL (ref 0.00–0.07)
Basophils Absolute: 0 10*3/uL (ref 0.0–0.1)
Basophils Relative: 0 %
Eosinophils Absolute: 0.1 10*3/uL (ref 0.0–0.5)
Eosinophils Relative: 1 %
HCT: 41 % (ref 36.0–46.0)
Hemoglobin: 13.8 g/dL (ref 12.0–15.0)
Immature Granulocytes: 0 %
Lymphocytes Relative: 31 %
Lymphs Abs: 2.9 10*3/uL (ref 0.7–4.0)
MCH: 29.2 pg (ref 26.0–34.0)
MCHC: 33.7 g/dL (ref 30.0–36.0)
MCV: 86.9 fL (ref 80.0–100.0)
Monocytes Absolute: 0.5 10*3/uL (ref 0.1–1.0)
Monocytes Relative: 6 %
Neutro Abs: 5.7 10*3/uL (ref 1.7–7.7)
Neutrophils Relative %: 62 %
Platelets: 441 10*3/uL — ABNORMAL HIGH (ref 150–400)
RBC: 4.72 MIL/uL (ref 3.87–5.11)
RDW: 14 % (ref 11.5–15.5)
WBC: 9.3 10*3/uL (ref 4.0–10.5)
nRBC: 0 % (ref 0.0–0.2)

## 2023-10-19 LAB — FERRITIN: Ferritin: 26 ng/mL (ref 11–307)

## 2023-10-19 LAB — TECHNOLOGIST SMEAR REVIEW: Plt Morphology: INCREASED

## 2023-10-19 LAB — C-REACTIVE PROTEIN: CRP: 0.8 mg/dL (ref <1.0)

## 2023-10-19 LAB — LACTATE DEHYDROGENASE: LDH: 131 U/L (ref 98–192)

## 2023-10-19 NOTE — Progress Notes (Signed)
[facility] CONSULT NOTE  Patient Care Team: Melida Quitter, PA as PCP - General (Family Medicine) Earna Coder, MD as Consulting Physician (Oncology) Linzie Collin, MD as Consulting Physician (Obstetrics and Gynecology)  CHIEF COMPLAINTS/PURPOSE OF CONSULTATION: Thrombocytosis.   HEMATOLOGY HISTORY  # THROMBOCYTOSIS [platelets- ; Hb; white count]; CT/US-     HISTORY OF PRESENTING ILLNESS: Patient ambulating-independently. Accompanied by mother.   Shannon Foley 38 y.o.  female pleasant patient was been referred to [place] for further evaluation of elevated platelets which was incidentally found on blood work.   Patient denies any history of blood clots or strokes. Denies any burning pain or discoloration in the fingertips or toes.  Appetite is good . No weight loss or night sweats no recurrent fevers. No cough or shortness of breath or chest pain.   Causes: Medications/steroids-alcohol/splenectomy/previous blood problems/ smoking  US abdomen: 2023-fatty liver no splenomegaly.    Review of Systems  Constitutional:  Negative for chills, diaphoresis, fever, malaise/fatigue and weight loss.  HENT:  Negative for nosebleeds and sore throat.   Eyes:  Negative for double vision.  Respiratory:  Negative for cough, hemoptysis, sputum production, shortness of breath and wheezing.   Cardiovascular:  Negative for chest pain, palpitations, orthopnea and leg swelling.  Gastrointestinal:  Negative for abdominal pain, blood in stool, constipation, diarrhea, heartburn, melena, nausea and vomiting.  Genitourinary:  Negative for dysuria, frequency and urgency.  Musculoskeletal:  Negative for back pain and joint pain.  Skin: Negative.  Negative for itching and rash.  Neurological:  Negative for dizziness, tingling, focal weakness, weakness and headaches.  Endo/Heme/Allergies:  Does not bruise/bleed easily.  Psychiatric/Behavioral:  Negative for depression. The  patient is not nervous/anxious and does not have insomnia.      MEDICAL HISTORY:  Past Medical History:  Diagnosis Date   Back pain    mid and lower back   Chronic abdominal pain    Chronic constipation    CIN I (cervical intraepithelial neoplasia I) 02/13/2019   On 02/2017 colposcopy     Endometrial polyp    Fibroids    one submucosal and one intramural (both  < 1 inch in size)   Headache    caused by pituitary cyst   IBS (irritable bowel syndrome)    Low grade squamous intraepithelial lesion (LGSIL) on cervical Pap smear 05/27/2018   With positive HRHPV     Missed abortion    Morbid obesity with BMI of 40.0-44.9, adult (HCC)    Motion sickness cars   Pituitary cyst (HCC)    Rathke's pouch cyst   Vitamin B12 deficiency    Vitamin D deficiency     SURGICAL HISTORY: Past Surgical History:  Procedure Laterality Date   BREAST BIOPSY Right 06/29/2023   [place] RT BREAST BX W LOC DEV 1ST LESION IMG BX SPEC US GUIDE 06/29/2023 [facility] MAMMOGRAPHY   COLONOSCOPY     CYSTOSCOPY  05/10/2016   WNL   DILATION AND EVACUATION N/A 04/13/2020   Procedure: SUCTION DILATION AND CURETTAGE;  Surgeon: Vena Austria, MD;  Location: [facility];  Service: Gynecology;  Laterality: N/A;   ESOPHAGOGASTRODUODENOSCOPY (EGD) WITH PROPOFOL N/A 02/18/2016   Procedure: ESOPHAGOGASTRODUODENOSCOPY (EGD) WITH PROPOFOL;  Surgeon: Midge Minium, MD;  Location: [facility];  Service: Endoscopy;  Laterality: N/A;   HYSTEROSCOPY WITH D & C N/A 12/11/2022   Procedure: HYSTEROSCOPY D&C;  Surgeon: Linzie Collin, MD;  Location: [facility] OR[facility];  Service: Gynecology;  Laterality: N/A;   TONSILLECTOMY  2015    SOCIAL HISTORY: Social History   Socioeconomic History   Marital status: Single    Spouse name: Not on file   Number of children: 0   Years of education: 14   Highest education level: Not on file  Occupational History    Comment: [employer]  Tobacco Use   Smoking status: Never    Passive exposure:  Never   Smokeless tobacco: Never  Vaping Use   Vaping status: Never Used  Substance and Sexual Activity   Alcohol use: No   Drug use: No   Sexual activity: Not Currently    Birth control/protection: None  Other Topics Concern   Not on file  Social History Narrative   Lives with mother   Social Drivers of Health   Financial Resource Strain: Not on file  Food Insecurity: No Food Insecurity (10/19/2023)   Hunger Vital Sign    Worried About Running Out of Food in the Last Year: Never true    Ran Out of Food in the Last Year: Never true  Transportation Needs: No Transportation Needs (10/19/2023)   PRAPARE - Administrator, Civil Service (Medical): No    Lack of Transportation (Non-Medical): No  Physical Activity: Not on file  Stress: Not on file  Social Connections: Not on file  Intimate Partner Violence: Not At Risk (10/19/2023)   Humiliation, Afraid, Rape, and Kick questionnaire    Fear of Current or Ex-Partner: No    Emotionally Abused: No    Physically Abused: No    Sexually Abused: No    FAMILY HISTORY: Family History  Problem Relation Age of Onset   Diabetes Mother    Hypertension Mother    Congestive Heart Failure Mother    CVA Father        Had meningioma   Diabetes Father    Hypertension Father    Bladder Cancer Paternal Aunt    Colon cancer Neg Hx    Esophageal cancer Neg Hx    Stomach cancer Neg Hx    Rectal cancer Neg Hx     ALLERGIES:  has no known allergies.  MEDICATIONS:  Current Outpatient Medications  Medication Sig Dispense Refill   Vitamin D, Ergocalciferol, (DRISDOL) 1.25 MG (50000 UNIT) CAPS capsule Take 1 capsule (50,000 Units total) by mouth every 7 (seven) days. 12 capsule 0   No current facility-administered medications for this visit.     PHYSICAL EXAMINATION:   Vitals:   10/19/23 1102  BP: (!) 145/90  Pulse: 85  Temp: 98.6 F (37 C)  SpO2: 95%   Filed Weights   10/19/23 1102  Weight: 246 lb 9.6 oz (111.9 kg)     Physical Exam Vitals and nursing note reviewed.  HENT:     Head: Normocephalic and atraumatic.     Mouth/Throat:     Pharynx: Oropharynx is clear.  Eyes:     Extraocular Movements: Extraocular movements intact.     Pupils: Pupils are equal, round, and reactive to light.  Cardiovascular:     Rate and Rhythm: Normal rate and regular rhythm.  Pulmonary:     Comments: Decreased breath sounds bilaterally.  Abdominal:     Palpations: Abdomen is soft.  Musculoskeletal:        General: Normal range of motion.     Cervical back: Normal range of motion.  Skin:    General: Skin is warm.  Neurological:     General: No focal deficit present.  Mental Status: She is alert and oriented to person, place, and time.  Psychiatric:        Behavior: Behavior normal.        Judgment: Judgment normal.      LABORATORY DATA:  I have reviewed the data as listed Lab Results  Component Value Date   WBC 9.3 09/28/2023   HGB 14.1 09/28/2023   HCT 43.6 09/28/2023   MCV 89 09/28/2023   PLT 477 (H) 09/28/2023   Recent Labs    12/06/22 1545 09/28/23 0911  NA 137 139  K 3.6 4.7  CL 106 103  CO2 22 22  GLUCOSE 90 84  BUN 14 12  CREATININE 0.78 0.75  CALCIUM 8.6* 9.2  GFRNONAA >60  --   PROT  --  7.4  ALBUMIN  --  4.2  AST  --  14  ALT  --  8  ALKPHOS  --  103  BILITOT  --  0.3     US Pelvis Complete Result Date: 10/05/2023 CLINICAL DATA:  Lower abdomen pain EXAM: TRANSABDOMINAL ULTRASOUND OF PELVIS TECHNIQUE: Transabdominal ultrasound examination of the pelvis was performed including evaluation of the uterus, ovaries, adnexal regions, and pelvic cul-de-sac. COMPARISON:  August 07, 2022 FINDINGS: Uterus Measurements: 10.3 x 5.2 x 6.5 cm = volume: 182.8 mL. Three uterine fibroids are noted, measuring 2.8 x 2.9 x 2.9 cm, 2.3 x 2.5 x 2.1 cm, 1 x 1 x 1 cm. The largest uterine fibroid is in the posterior right myometrium. Endometrium Thickness: 10 mm.  No focal abnormality visualized.  Right ovary Measurements: 5.4 x 3.5 x 2.9 cm = volume: 29.3 mL. Normal appearance/no adnexal mass. Left ovary Not visualized. Other findings:  No abnormal free fluid. IMPRESSION: 1. No acute abnormality identified. 2. Uterine fibroids. Electronically Signed   By: Sherian Rein M.D.   On: 10/05/2023 14:43    ASSESSMENT & PLAN:   Thrombocytosis Mild intermittent thrombocytosis [470- 500]-suggestive of reactive/iron deficiency rather than essential thrombocytosis.  Patient is asymptomatic.  Long discussion with the patient regarding potential cause of the abnormal blood counts-including malignancy iron deficiency and other reactive causes.  #  For now I  recommend checking CBC;CMP jak 2 BCR ABL; MPL; CALR mutation on the peripheral blood.LDH;  Check peripheral smear. Also discussed regarding bone marrow biopsy with the above workup is inconclusive; however I would prefer not to do a bone marrow unless absolutely needed.  Wait for above workup.  # Vitamin D deficiency on vitamin D 50,000 units PCP  Thank you Ms.Edstorm PA for allowing me to participate in the care of your pleasant patient. Please do not hesitate to contact me with questions or concerns in the interim.  # Patient follow-up with me in approximately 2-3 weeks to review the above results. All questions were answered. The patient knows to call the clinic with any problems, questions or concerns.  # DISPOSITION: # labs today- ordered-  # follow up in appx 2 weeks-- MD; no labs- Dr.B       Earna Coder, MD 10/19/2023 11:49 AM

## 2023-10-19 NOTE — Assessment & Plan Note (Addendum)
Mild intermittent thrombocytosis [470- 500]-suggestive of reactive/iron deficiency rather than essential thrombocytosis.  Patient is asymptomatic.  Long discussion with the patient regarding potential cause of the abnormal blood counts-including malignancy iron deficiency and other reactive causes.  #  For now I  recommend checking CBC;CMP jak 2 BCR ABL; MPL; CALR mutation on the peripheral blood.LDH;  Check peripheral smear. Also discussed regarding bone marrow biopsy with the above workup is inconclusive; however I would prefer not to do a bone marrow unless absolutely needed.  Wait for above workup.  # Vitamin D deficiency on vitamin D 50,000 units PCP  Thank you Ms.Edstorm PA for allowing me to participate in the care of your pleasant patient. Please do not hesitate to contact me with questions or concerns in the interim.  # Patient follow-up with me in approximately 2-3 weeks to review the above results. All questions were answered. The patient knows to call the clinic with any problems, questions or concerns.  # DISPOSITION: # labs today- ordered-  # follow up in appx 2 weeks-- MD; no labs- Dr.B

## 2023-10-19 NOTE — Progress Notes (Signed)
Fatigue: no  Headaches: yes-pituitary cyst Joint pain: no Bleeding from gums/nose:  no

## 2023-10-24 LAB — MPL MUTATION ANALYSIS

## 2023-10-24 LAB — JAK2 GENOTYPR

## 2023-10-25 ENCOUNTER — Encounter: Payer: Self-pay | Admitting: Family Medicine

## 2023-10-26 LAB — BCR-ABL1 FISH
Cells Analyzed: 200
Cells Counted: 200

## 2023-10-26 LAB — CALRETICULIN (CALR) MUTATION ANALYSIS

## 2023-10-29 ENCOUNTER — Inpatient Hospital Stay: Payer: Medicaid Other | Attending: Internal Medicine | Admitting: Internal Medicine

## 2023-10-29 ENCOUNTER — Encounter: Payer: Self-pay | Admitting: Internal Medicine

## 2023-10-29 VITALS — BP 135/92 | HR 88 | Temp 97.6°F | Ht 63.0 in | Wt 249.6 lb

## 2023-10-29 DIAGNOSIS — D75839 Thrombocytosis, unspecified: Secondary | ICD-10-CM | POA: Insufficient documentation

## 2023-10-29 DIAGNOSIS — E611 Iron deficiency: Secondary | ICD-10-CM | POA: Insufficient documentation

## 2023-10-29 DIAGNOSIS — E559 Vitamin D deficiency, unspecified: Secondary | ICD-10-CM | POA: Insufficient documentation

## 2023-10-29 NOTE — Progress Notes (Signed)
 [facility] CONSULT NOTE  Patient Care Team: Noreene Bearded, PA as PCP - General (Family Medicine) Gwyn Leos, MD as Consulting Physician (Oncology) Zenobia Hila, MD as Consulting Physician (Obstetrics and Gynecology)  CHIEF COMPLAINTS/PURPOSE OF CONSULTATION: Thrombocytosis.   HEMATOLOGY HISTORY  # THROMBOCYTOSIS [platelets- ; Hb; white count]; CT/US -   HISTORY OF PRESENTING ILLNESS: Patient ambulating-independently. Accompanied by mother.   Shannon Foley 38 y.o.  female pleasant patient is here for follow-up reviewed the blood work ordered for thrombocytosis.  Appetite is good . No weight loss or night sweats no recurrent fevers. No cough or shortness of breath or chest pain.   US  abdomen: 2023-fatty liver no splenomegaly.    Review of Systems  Constitutional:  Negative for chills, diaphoresis, fever, malaise/fatigue and weight loss.  HENT:  Negative for nosebleeds and sore throat.   Eyes:  Negative for double vision.  Respiratory:  Negative for cough, hemoptysis, sputum production, shortness of breath and wheezing.   Cardiovascular:  Negative for chest pain, palpitations, orthopnea and leg swelling.  Gastrointestinal:  Negative for abdominal pain, blood in stool, constipation, diarrhea, heartburn, melena, nausea and vomiting.  Genitourinary:  Negative for dysuria, frequency and urgency.  Musculoskeletal:  Negative for back pain and joint pain.  Skin: Negative.  Negative for itching and rash.  Neurological:  Negative for dizziness, tingling, focal weakness, weakness and headaches.  Endo/Heme/Allergies:  Does not bruise/bleed easily.  Psychiatric/Behavioral:  Negative for depression. The patient is not nervous/anxious and does not have insomnia.      MEDICAL HISTORY:  Past Medical History:  Diagnosis Date   Back pain    mid and lower back   Chronic abdominal pain    Chronic constipation    CIN I (cervical intraepithelial neoplasia  I) 02/13/2019   On 02/2017 colposcopy     Endometrial polyp    Fibroids    one submucosal and one intramural (both  < 1 inch in size)   Headache    caused by pituitary cyst   IBS (irritable bowel syndrome)    Low grade squamous intraepithelial lesion (LGSIL) on cervical Pap smear 05/27/2018   With positive HRHPV     Missed abortion    Morbid obesity with BMI of 40.0-44.9, adult (HCC)    Motion sickness cars   Pituitary cyst (HCC)    Rathke's pouch cyst   Vitamin B12 deficiency    Vitamin D  deficiency     SURGICAL HISTORY: Past Surgical History:  Procedure Laterality Date   BREAST BIOPSY Right 06/29/2023   US  RT BREAST BX W LOC DEV 1ST LESION IMG BX SPEC US  GUIDE 06/29/2023 [facility]   COLONOSCOPY     CYSTOSCOPY  05/10/2016   WNL   DILATION AND EVACUATION N/A 04/13/2020   Procedure: SUCTION DILATION AND CURETTAGE;  Surgeon: Darl Edu, MD;  Location: [facility];  Service: Gynecology;  Laterality: N/A;   ESOPHAGOGASTRODUODENOSCOPY (EGD) WITH PROPOFOL  N/A 02/18/2016   Procedure: ESOPHAGOGASTRODUODENOSCOPY (EGD) WITH PROPOFOL ;  Surgeon: Marnee Sink, MD;  Location: [facility];  Service: Endoscopy;  Laterality: N/A;   HYSTEROSCOPY WITH D & C N/A 12/11/2022   Procedure: HYSTEROSCOPY D&C;  Surgeon: Zenobia Hila, MD;  Location: [facility];  Service: Gynecology;  Laterality: N/A;   TONSILLECTOMY  2015    SOCIAL HISTORY: Social History   Socioeconomic History   Marital status: Single    Spouse name: Not on file   Number of children: 0   Years of education:  14   Highest education level: Not on file  Occupational History    Comment: [employer]  Tobacco Use   Smoking status: Never    Passive exposure: Never   Smokeless tobacco: Never  Vaping Use   Vaping status: Never Used  Substance and Sexual Activity   Alcohol use: No   Drug use: No   Sexual activity: Not Currently    Birth control/protection: None  Other Topics Concern   Not on file  Social  History Narrative   Lives with mother   Social Drivers of Health   Financial Resource Strain: Not on file  Food Insecurity: No Food Insecurity (10/19/2023)   Hunger Vital Sign    Worried About Running Out of Food in the Last Year: Never true    Ran Out of Food in the Last Year: Never true  Transportation Needs: No Transportation Needs (10/19/2023)   PRAPARE - Administrator, Civil Service (Medical): No    Lack of Transportation (Non-Medical): No  Physical Activity: Not on file  Stress: Not on file  Social Connections: Not on file  Intimate Partner Violence: Not At Risk (10/19/2023)   Humiliation, Afraid, Rape, and Kick questionnaire    Fear of Current or Ex-Partner: No    Emotionally Abused: No    Physically Abused: No    Sexually Abused: No    FAMILY HISTORY: Family History  Problem Relation Age of Onset   Diabetes Mother    Hypertension Mother    Congestive Heart Failure Mother    CVA Father        Had meningioma   Diabetes Father    Hypertension Father    Bladder Cancer Paternal Aunt    Colon cancer Neg Hx    Esophageal cancer Neg Hx    Stomach cancer Neg Hx    Rectal cancer Neg Hx     ALLERGIES:  has no known allergies.  MEDICATIONS:  Current Outpatient Medications  Medication Sig Dispense Refill   Vitamin D , Ergocalciferol , (DRISDOL ) 1.25 MG (50000 UNIT) CAPS capsule Take 1 capsule (50,000 Units total) by mouth every 7 (seven) days. 12 capsule 0   No current facility-administered medications for this visit.     PHYSICAL EXAMINATION:   Vitals:   10/29/23 0930  BP: (!) 135/92  Pulse: 88  Temp: 97.6 F (36.4 C)  SpO2: 97%   Filed Weights   10/29/23 0930  Weight: 249 lb 9.6 oz (113.2 kg)    Physical Exam Vitals and nursing note reviewed.  HENT:     Head: Normocephalic and atraumatic.     Mouth/Throat:     Pharynx: Oropharynx is clear.  Eyes:     Extraocular Movements: Extraocular movements intact.     Pupils: Pupils are equal,  round, and reactive to light.  Cardiovascular:     Rate and Rhythm: Normal rate and regular rhythm.  Pulmonary:     Comments: Decreased breath sounds bilaterally.  Abdominal:     Palpations: Abdomen is soft.  Musculoskeletal:        General: Normal range of motion.     Cervical back: Normal range of motion.  Skin:    General: Skin is warm.  Neurological:     General: No focal deficit present.     Mental Status: She is alert and oriented to person, place, and time.  Psychiatric:        Behavior: Behavior normal.        Judgment: Judgment normal.  LABORATORY DATA:  I have reviewed the data as listed Lab Results  Component Value Date   WBC 9.3 10/19/2023   HGB 13.8 10/19/2023   HCT 41.0 10/19/2023   MCV 86.9 10/19/2023   PLT 441 (H) 10/19/2023   Recent Labs    12/06/22 1545 09/28/23 0911  NA 137 139  K 3.6 4.7  CL 106 103  CO2 22 22  GLUCOSE 90 84  BUN 14 12  CREATININE 0.78 0.75  CALCIUM 8.6* 9.2  GFRNONAA >60  --   PROT  --  7.4  ALBUMIN  --  4.2  AST  --  14  ALT  --  8  ALKPHOS  --  103  BILITOT  --  0.3     US  Pelvis Complete Result Date: 10/05/2023 CLINICAL DATA:  Lower abdomen pain EXAM: TRANSABDOMINAL ULTRASOUND OF PELVIS TECHNIQUE: Transabdominal ultrasound examination of the pelvis was performed including evaluation of the uterus, ovaries, adnexal regions, and pelvic cul-de-sac. COMPARISON:  August 07, 2022 FINDINGS: Uterus Measurements: 10.3 x 5.2 x 6.5 cm = volume: 182.8 mL. Three uterine fibroids are noted, measuring 2.8 x 2.9 x 2.9 cm, 2.3 x 2.5 x 2.1 cm, 1 x 1 x 1 cm. The largest uterine fibroid is in the posterior right myometrium. Endometrium Thickness: 10 mm.  No focal abnormality visualized. Right ovary Measurements: 5.4 x 3.5 x 2.9 cm = volume: 29.3 mL. Normal appearance/no adnexal mass. Left ovary Not visualized. Other findings:  No abnormal free fluid. IMPRESSION: 1. No acute abnormality identified. 2. Uterine fibroids. Electronically  Signed   By: Anna Barnes M.D.   On: 10/05/2023 14:43    ASSESSMENT & PLAN:   Thrombocytosis Mild intermittent thrombocytosis [470- 500]-suggestive of reactive/iron deficiency rather than essential thrombocytosis.  Patient is asymptomatic.  Clinically suggestive for REACTIVE  rather than malignancy.  Jak 2 BCR ABL; MPL; CALR mutation- NEGATIVE.  HOLD off bone marrow  # ? Iron def- ferritin- 26- recommend PO iron. #Recommend gentle iron [iron biglycinate; 28 mg ] 1 pill a day.  This pill is unlikely to cause stomach upset or cause constipation.  HOLD off venofer for now.   # Vitamin D  deficiency on vitamin D  50,000 units [PCP]  # DISPOSITION: # follow up in 6 months- MD; labs- cbc/cmp; iron studies; ferritin-Dr.B        Gwyn Leos, MD 10/29/2023 10:24 AM

## 2023-10-29 NOTE — Assessment & Plan Note (Addendum)
 Mild intermittent thrombocytosis [470- 500]-suggestive of reactive/iron deficiency rather than essential thrombocytosis.  Patient is asymptomatic.  Clinically suggestive for REACTIVE  rather than malignancy.  Jak 2 BCR ABL; MPL; CALR mutation- NEGATIVE.  HOLD off bone marrow  # ? Iron def- ferritin- 26- recommend PO iron. #Recommend gentle iron [iron biglycinate; 28 mg ] 1 pill a day.  This pill is unlikely to cause stomach upset or cause constipation.  HOLD off venofer for now.   # Vitamin D  deficiency on vitamin D  50,000 units [PCP]  # DISPOSITION: # follow up in 6 months- MD; labs- cbc/cmp; iron studies; ferritin-Dr.B

## 2023-10-29 NOTE — Progress Notes (Signed)
 Fatigue: NO Headaches:  NO Joint pain: NO Bleeding from gums/nose:  NO

## 2023-10-29 NOTE — Patient Instructions (Signed)
#  Recommend gentle iron [iron biglycinate; 28 mg ] 1 pill a day.  This pill is unlikely to cause stomach upset or cause constipation.

## 2023-11-12 ENCOUNTER — Encounter: Payer: Self-pay | Admitting: Family Medicine

## 2023-12-07 ENCOUNTER — Other Ambulatory Visit (HOSPITAL_COMMUNITY)
Admission: RE | Admit: 2023-12-07 | Discharge: 2023-12-07 | Disposition: A | Discharge status: Home / Self Care | Source: Ambulatory Visit | Attending: Certified Nurse Midwife | Admitting: Certified Nurse Midwife

## 2023-12-07 ENCOUNTER — Ambulatory Visit (INDEPENDENT_AMBULATORY_CARE_PROVIDER_SITE_OTHER): Admitting: Certified Nurse Midwife

## 2023-12-07 ENCOUNTER — Encounter: Payer: Self-pay | Admitting: Obstetrics and Gynecology

## 2023-12-07 ENCOUNTER — Encounter: Payer: Self-pay | Admitting: Certified Nurse Midwife

## 2023-12-07 VITALS — BP 136/84 | HR 94 | Wt 247.4 lb

## 2023-12-07 DIAGNOSIS — R102 Pelvic and perineal pain: Secondary | ICD-10-CM

## 2023-12-07 NOTE — Progress Notes (Signed)
 error

## 2023-12-07 NOTE — Patient Instructions (Signed)
Pelvic Pain, Female Pelvic pain is pain in your lower belly (abdomen), below your belly button and between your hips. The pain may: Start all of a sudden (be acute). Keep coming back (be recurring). Last a long time (become chronic). Pelvic pain that lasts longer than 6 months is called chronic pelvic pain. There are many causes of pelvic pain. Sometimes the cause of pelvic pain is not known. Follow these instructions at home:  Take over-the-counter and prescription medicines only as told by your doctor. Rest as told by your doctor. Do not have sex if it hurts. Keep a journal of your pelvic pain. Write down: When the pain started. Where the pain is located. What seems to make the pain better or worse, such as food or your monthly period (menstrual cycle). Any symptoms you have along with the pain. Keep all follow-up visits. Contact a doctor if: Medicine does not help your pain, or your pain comes back. You have new symptoms. You have unusual discharge or bleeding from your vagina. You have a fever or chills. You are having trouble pooping (constipation). You have blood in your pee (urine) or poop (stool). Your pee smells bad. You feel weak or light-headed. Get help right away if: You have sudden pain that is very bad. You have very bad pain and also have any of these symptoms: A fever. Feeling like you may vomit (nauseous). Vomiting. Being very sweaty. You faint. These symptoms may be an emergency. Get help right away. Call your local emergency services (911 in the [place] Do not wait to see if the symptoms will go away. Do not drive yourself to the hospital. Summary Pelvic pain is pain in your lower belly (abdomen), below your belly button and between your hips. There are many causes of pelvic pain. Keep a journal of your pelvic pain. This information is not intended to replace advice given to you by your health care provider. Make sure you discuss any questions you have with  your health care provider. Document Revised: 01/11/2021 Document Reviewed: 01/11/2021 Elsevier Patient Education  2024 Elsevier Inc.  

## 2023-12-07 NOTE — Progress Notes (Signed)
 GYN ENCOUNTER NOTE  Subjective:       Shannon Foley is a 38 y.o. G19P0010 female is here for gynecologic evaluation of the following issues:  1. Pelvic pressure, for the last few months. She thinks that it is due to her birth control that Dr. Logan Bores put her on. She admits to having some change in her vaginal discharge. She is not currently sexually active.     Gynecologic History No LMP recorded (within months). Contraception: OCP (estrogen/progesterone) Last Pap: 02/11/2020. Results were: normal/neg HPV Last mammogram: n/a    Obstetric History OB History  Gravida Para Term Preterm AB Living  1 0 0 0 1 0  SAB IAB Ectopic Multiple Live Births  1 0 0 0 0    # Outcome Date GA Lbr Len/2nd Weight Sex Type Anes PTL Lv  1 SAB             Past Medical History:  Diagnosis Date   Back pain    mid and lower back   Chronic abdominal pain    Chronic constipation    CIN I (cervical intraepithelial neoplasia I) 02/13/2019   On 02/2017 colposcopy     Endometrial polyp    Fibroids    one submucosal and one intramural (both  < 1 inch in size)   Headache    caused by pituitary cyst   IBS (irritable bowel syndrome)    Low grade squamous intraepithelial lesion (LGSIL) on cervical Pap smear 05/27/2018   With positive HRHPV     Missed abortion    Morbid obesity with BMI of 40.0-44.9, adult (HCC)    Motion sickness cars   Pituitary cyst (HCC)    Rathke's pouch cyst   Vitamin B12 deficiency    Vitamin D deficiency     Past Surgical History:  Procedure Laterality Date   BREAST BIOPSY Right 06/29/2023   [place] RT BREAST BX W LOC DEV 1ST LESION IMG BX SPEC US GUIDE 06/29/2023 [facility]   COLONOSCOPY     CYSTOSCOPY  05/10/2016   WNL   DILATION AND EVACUATION N/A 04/13/2020   Procedure: SUCTION DILATION AND CURETTAGE;  Surgeon: Vena Austria, MD;  Location: [facility];  Service: Gynecology;  Laterality: N/A;   ESOPHAGOGASTRODUODENOSCOPY (EGD) WITH PROPOFOL N/A 02/18/2016    Procedure: ESOPHAGOGASTRODUODENOSCOPY (EGD) WITH PROPOFOL;  Surgeon: Midge Minium, MD;  Location: [facility];  Service: Endoscopy;  Laterality: N/A;   HYSTEROSCOPY WITH D & C N/A 12/11/2022   Procedure: HYSTEROSCOPY D&C;  Surgeon: Linzie Collin, MD;  Location: [facility];  Service: Gynecology;  Laterality: N/A;   TONSILLECTOMY  2015    Current Outpatient Medications on File Prior to Visit  Medication Sig Dispense Refill   desogestrel-ethinyl estradiol (VOLNEA) 0.15-0.02/0.01 MG (21/5) tablet Take 1 tablet by mouth daily.     Vitamin D, Ergocalciferol, (DRISDOL) 1.25 MG (50000 UNIT) CAPS capsule Take 1 capsule (50,000 Units total) by mouth every 7 (seven) days. 12 capsule 0   No current facility-administered medications on file prior to visit.    No Known Allergies  Social History   Socioeconomic History   Marital status: Single    Spouse name: Not on file   Number of children: 0   Years of education: 14   Highest education level: Not on file  Occupational History    Comment: [employer]  Tobacco Use   Smoking status: Never    Passive exposure: Never   Smokeless tobacco: Never  Vaping Use  Vaping status: Never Used  Substance and Sexual Activity   Alcohol use: No   Drug use: No   Sexual activity: Not Currently    Birth control/protection: None  Other Topics Concern   Not on file  Social History Narrative   Lives with mother   Social Drivers of Health   Financial Resource Strain: Not on file  Food Insecurity: No Food Insecurity (10/19/2023)   Hunger Vital Sign    Worried About Running Out of Food in the Last Year: Never true    Ran Out of Food in the Last Year: Never true  Transportation Needs: No Transportation Needs (10/19/2023)   PRAPARE - Administrator, Civil Service (Medical): No    Lack of Transportation (Non-Medical): No  Physical Activity: Not on file  Stress: Not on file  Social Connections: Not on file  Intimate Partner Violence:  Not At Risk (10/19/2023)   Humiliation, Afraid, Rape, and Kick questionnaire    Fear of Current or Ex-Partner: No    Emotionally Abused: No    Physically Abused: No    Sexually Abused: No    Family History  Problem Relation Age of Onset   Diabetes Mother    Hypertension Mother    Congestive Heart Failure Mother    CVA Father        Had meningioma   Diabetes Father    Hypertension Father    Bladder Cancer Paternal Aunt    Colon cancer Neg Hx    Esophageal cancer Neg Hx    Stomach cancer Neg Hx    Rectal cancer Neg Hx     The following portions of the patient's history were reviewed and updated as appropriate: allergies, current medications, past family history, past medical history, past social history, past surgical history and problem list.  Review of Systems Review of Systems - Negative except as mentioned in HPI Review of Systems - General ROS: negative for - chills, fatigue, fever, hot flashes, malaise or night sweats Hematological and Lymphatic ROS: negative for - bleeding problems or swollen lymph nodes Gastrointestinal ROS: negative for - abdominal pain, blood in stools, change in bowel habits and nausea/vomiting Musculoskeletal ROS: negative for - joint pain, muscle pain or muscular weakness Genito-Urinary ROS: negative for - change in menstrual cycle, dysmenorrhea, dyspareunia, dysuria, genital discharge, genital ulcers, hematuria, incontinence, irregular/heavy menses, nocturia. Positive for  pelvic pain/pressure  Objective:   BP 136/84   Pulse 94   Wt 247 lb 6.4 oz (112.2 kg)   LMP  (Within Months)   BMI 43.82 kg/m  CONSTITUTIONAL: Well-developed, well-nourished female in no acute distress.  HENT:  Normocephalic, atraumatic.  NECK: Normal range of motion, supple, no masses.  Normal thyroid.  SKIN: Skin is warm and dry. No rash noted. Not diaphoretic. No erythema. No pallor. NEUROLGIC: Alert and oriented to person, place, and time. PSYCHIATRIC: Normal mood and  affect. Normal behavior. Normal judgment and thought content. CARDIOVASCULAR:Not Examined RESPIRATORY: Not Examined BREASTS: Not Examined ABDOMEN: Soft, non distended; Non tender.  No Organomegaly. PELVIC:  External Genitalia: Normal  BUS: Normal  Vagina: Normal  Cervix: Normal, swab collected   Uterus: Normal size, shape,consistency, mobile, difficult to evaluate due to body habitus. Tender on exam  Adnexa: Normal  r MUSCULOSKELETAL: Normal range of motion. No tenderness.  No cyanosis, clubbing, or edema.     Assessment:   Pelvic pain    Plan:   Discussed possible infection for reason of pain. Pevic pain/pressure due to OCP  unlikely. Swab collected. Pt does have 3 fibroids noted in last u/s. Discussed possibility that the fibroids may have enlarged contributing to pressure. Reviewed that fibroids are a benign finding. Discussed repeat u/s in July ( 6 months for previous u/s) if pressure/pain increases or does not resolve. She is in agreement to plan. Follow up with Dr. Logan Bores if cycle does not return per his previous note or PRN.   Doreene Burke, CNM

## 2023-12-11 ENCOUNTER — Encounter: Payer: Self-pay | Admitting: Certified Nurse Midwife

## 2023-12-11 LAB — CERVICOVAGINAL ANCILLARY ONLY
Bacterial Vaginitis (gardnerella): POSITIVE — AB
Candida Glabrata: NEGATIVE
Candida Vaginitis: NEGATIVE
Chlamydia: NEGATIVE
Comment: NEGATIVE
Comment: NEGATIVE
Comment: NEGATIVE
Comment: NEGATIVE
Comment: NEGATIVE
Comment: NORMAL
Neisseria Gonorrhea: NEGATIVE
Trichomonas: NEGATIVE

## 2023-12-12 ENCOUNTER — Other Ambulatory Visit: Payer: Self-pay

## 2023-12-12 ENCOUNTER — Other Ambulatory Visit: Payer: Self-pay | Admitting: Certified Nurse Midwife

## 2023-12-12 DIAGNOSIS — B9689 Other specified bacterial agents as the cause of diseases classified elsewhere: Secondary | ICD-10-CM

## 2023-12-12 MED ORDER — METRONIDAZOLE 500 MG PO TABS
500.0000 mg | ORAL_TABLET | Freq: Two times a day (BID) | ORAL | 0 refills | Status: DC
Start: 2023-12-12 — End: 2024-01-31

## 2024-01-08 ENCOUNTER — Other Ambulatory Visit: Payer: Self-pay | Admitting: Family Medicine

## 2024-01-08 DIAGNOSIS — E559 Vitamin D deficiency, unspecified: Secondary | ICD-10-CM

## 2024-01-30 ENCOUNTER — Encounter: Payer: Self-pay | Admitting: Obstetrics and Gynecology

## 2024-01-31 ENCOUNTER — Ambulatory Visit (INDEPENDENT_AMBULATORY_CARE_PROVIDER_SITE_OTHER): Admitting: Obstetrics and Gynecology

## 2024-01-31 ENCOUNTER — Encounter: Payer: Self-pay | Admitting: Obstetrics and Gynecology

## 2024-01-31 VITALS — BP 137/84 | HR 93 | Ht 63.0 in | Wt 247.0 lb

## 2024-01-31 DIAGNOSIS — N926 Irregular menstruation, unspecified: Secondary | ICD-10-CM | POA: Diagnosis not present

## 2024-01-31 DIAGNOSIS — N644 Mastodynia: Secondary | ICD-10-CM

## 2024-01-31 NOTE — Patient Instructions (Signed)
 I value your feedback and you entrusting [place] with your care. If you get a [place] patient survey, I would appreciate you taking the time to let us know about your experience today. Thank you! ? ? ?

## 2024-01-31 NOTE — Progress Notes (Signed)
 Shannon Bearded, Shannon Foley   Chief Complaint  Patient presents with   Breast Exam    Soreness in both breasts x 1 wk    HPI:      Shannon Foley is a 38 y.o. G1P0010 whose LMP was Patient's last menstrual period was 12/18/2023 (exact date)., presents today for bilat breast tenderness for the past wk, no masses. Tried ibup without relief. Pt drinks 2-3 caffeinated drinks daily, more this wk. S/p Cat 4 mammo 10/24 RT breast with fibroadenoma on bx; repeat mammo due age 33.  Pt with irregular menses now; LMP was 20 days in April, no menses since. NOT sexually active.  Followed by Dr. Luster Salters. Was on megace  for 3 months last yr for polyp with simple hyperplasia; then put on OCPs 12/24 since no menses after stopping it. Has since completed OCP course. Hx of pituitary cyst with normal prolactin 10/23.  Patient Active Problem List   Diagnosis Date Noted   Thrombocytosis 10/19/2023   Tibialis posterior tendinopathy 07/13/2023   Vitamin D  deficiency 07/13/2022   Impaired fasting glucose 07/13/2022   BMI 40.0-44.9, adult (HCC) 06/11/2022   Recurrent pansinusitis 10/08/2020   Hiatal hernia 06/23/2018   Uterine fibroid 06/13/2018   IBS (irritable bowel syndrome) 05/29/2018   Gastroesophageal reflux disease without esophagitis 04/03/2018   Chronic midline low back pain without sciatica 04/03/2018   Chronic midline thoracic back pain 04/03/2018   Rathke's cleft cyst (HCC) 02/02/2016   Hyperprolactinemia (HCC) 04/29/2014   Vitamin B 12 deficiency 04/29/2014   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 04/29/2014   Pituitary cyst (HCC) 02/19/2014   Dupuytren's contracture of foot 12/18/2013   Plantar fasciitis 12/18/2013   Calcaneal bursitis 12/18/2013   Bilateral plantar fasciitis 12/18/2013    Past Surgical History:  Procedure Laterality Date   BREAST BIOPSY Right 06/29/2023   US  RT BREAST BX W LOC DEV 1ST LESION IMG BX SPEC US  GUIDE 06/29/2023 [facility]  MAMMOGRAPHY   COLONOSCOPY     CYSTOSCOPY  05/10/2016   WNL   DILATION AND EVACUATION N/A 04/13/2020   Procedure: SUCTION DILATION AND CURETTAGE;  Surgeon: Darl Edu, MD;  Location: [facility];  Service: Gynecology;  Laterality: N/A;   ESOPHAGOGASTRODUODENOSCOPY (EGD) WITH PROPOFOL  N/A 02/18/2016   Procedure: ESOPHAGOGASTRODUODENOSCOPY (EGD) WITH PROPOFOL ;  Surgeon: Marnee Sink, MD;  Location: [facility];  Service: Endoscopy;  Laterality: N/A;   HYSTEROSCOPY WITH D & C N/A 12/11/2022   Procedure: HYSTEROSCOPY D&C;  Surgeon: Zenobia Hila, MD;  Location: [facility];  Service: Gynecology;  Laterality: N/A;   TONSILLECTOMY  2015    Family History  Problem Relation Age of Onset   Diabetes Mother    Hypertension Mother    Congestive Heart Failure Mother    CVA Father        Had meningioma   Diabetes Father    Hypertension Father    Bladder Cancer Paternal Aunt    Colon cancer Neg Hx    Esophageal cancer Neg Hx    Stomach cancer Neg Hx    Rectal cancer Neg Hx     Social History   Socioeconomic History   Marital status: Single    Spouse name: Not on file   Number of children: 0   Years of education: 14   Highest education level: Not on file  Occupational History    Comment: [employer]  Tobacco Use   Smoking status: Never    Passive exposure: Never  Smokeless tobacco: Never  Vaping Use   Vaping status: Never Used  Substance and Sexual Activity   Alcohol use: No   Drug use: No   Sexual activity: Not Currently    Birth control/protection: None  Other Topics Concern   Not on file  Social History Narrative   Lives with mother   Social Drivers of Health   Financial Resource Strain: Not on file  Food Insecurity: No Food Insecurity (10/19/2023)   Hunger Vital Sign    Worried About Running Out of Food in the Last Year: Never true    Ran Out of Food in the Last Year: Never true  Transportation Needs: No Transportation Needs (10/19/2023)   PRAPARE -  Administrator, Civil Service (Medical): No    Lack of Transportation (Non-Medical): No  Physical Activity: Not on file  Stress: Not on file  Social Connections: Not on file  Intimate Partner Violence: Not At Risk (10/19/2023)   Humiliation, Afraid, Rape, and Kick questionnaire    Fear of Current or Ex-Partner: No    Emotionally Abused: No    Physically Abused: No    Sexually Abused: No    Outpatient Medications Prior to Visit  Medication Sig Dispense Refill   Vitamin D , Ergocalciferol , (DRISDOL ) 1.25 MG (50000 UNIT) CAPS capsule TAKE 1 CAPSULE BY MOUTH EVERY 7 DAYS 12 capsule 0   desogestrel -ethinyl estradiol  (VOLNEA ) 0.15-0.02/0.01 MG (21/5) tablet Take 1 tablet by mouth daily. (Patient not taking: Reported on 01/31/2024)     metroNIDAZOLE  (FLAGYL ) 500 MG tablet Take 1 tablet (500 mg total) by mouth 2 (two) times daily. 14 tablet 0   No facility-administered medications prior to visit.      ROS:  Review of Systems  Constitutional:  Negative for fever.  Gastrointestinal:  Negative for blood in stool, constipation, diarrhea, nausea and vomiting.  Genitourinary:  Positive for menstrual problem. Negative for dyspareunia, dysuria, flank pain, frequency, hematuria, urgency, vaginal bleeding, vaginal discharge and vaginal pain.  Musculoskeletal:  Negative for back pain.  Skin:  Negative for rash.   BREAST: tenderness   OBJECTIVE:   Vitals:  BP 137/84   Pulse 93   Ht 5\' 3"  (1.6 m)   Wt 247 lb (112 kg)   LMP 12/18/2023 (Exact Date)   BMI 43.75 kg/m   Physical Exam Vitals reviewed.  Pulmonary:     Effort: Pulmonary effort is normal.  Chest:  Breasts:    Breasts are symmetrical.     Right: Tenderness present. No inverted nipple, mass, nipple discharge or skin change.     Left: Tenderness present. No inverted nipple, mass, nipple discharge or skin change.    Musculoskeletal:        General: Normal range of motion.     Cervical back: Normal range of motion.   Skin:    General: Skin is warm and dry.  Neurological:     General: No focal deficit present.     Mental Status: She is alert and oriented to person, place, and time.     Cranial Nerves: No cranial nerve deficit.  Psychiatric:        Mood and Affect: Mood normal.        Behavior: Behavior normal.        Thought Content: Thought content normal.        Judgment: Judgment normal.    Assessment/Plan: Mastodynia--for past wk; neg breast exam for masses. Most likely hormonal due irregular menses. D/C caffeine/NSAIDs/ice or warm compresses. Should  improve. F/u prn.   Irregular periods--pt to message Dr. Luster Salters for f/u.     Return if symptoms worsen or fail to improve.  Shannon Verbeke B. Havyn Ramo, Shannon Foley-C 01/31/2024 4:29 PM

## 2024-02-02 ENCOUNTER — Encounter: Payer: Self-pay | Admitting: Family Medicine

## 2024-02-06 ENCOUNTER — Telehealth: Payer: Self-pay

## 2024-02-18 ENCOUNTER — Other Ambulatory Visit: Payer: Self-pay | Admitting: Family Medicine

## 2024-02-18 DIAGNOSIS — H9319 Tinnitus, unspecified ear: Secondary | ICD-10-CM

## 2024-03-05 ENCOUNTER — Ambulatory Visit
Admission: EM | Admit: 2024-03-05 | Discharge: 2024-03-05 | Disposition: A | Discharge status: Home / Self Care | Attending: Emergency Medicine | Admitting: Emergency Medicine

## 2024-03-05 ENCOUNTER — Encounter: Payer: Self-pay | Admitting: Emergency Medicine

## 2024-03-05 ENCOUNTER — Ambulatory Visit

## 2024-03-05 DIAGNOSIS — H6993 Unspecified Eustachian tube disorder, bilateral: Secondary | ICD-10-CM | POA: Diagnosis not present

## 2024-03-05 MED ORDER — METHYLPREDNISOLONE 4 MG PO TBPK
ORAL_TABLET | ORAL | 0 refills | Status: DC
Start: 2024-03-05 — End: 2024-06-24

## 2024-03-05 MED ORDER — FLUTICASONE PROPIONATE 50 MCG/ACT NA SUSP: 2.0000 | Freq: Every day | NASAL | 1 refills | Status: AC

## 2024-03-05 NOTE — Progress Notes (Deleted)
   Acute Office Visit  Subjective:     Patient ID: Shannon Foley, female    DOB: Dec 30, 1985, 38 y.o.   MRN: [id number]  No chief complaint on file.   HPI  Shannon Foley is a 38 y.o. female who presents to the clinic today c/o whooshing/ringing in her *** ear.   ROS Per HPI     Objective:    There were no vitals taken for this visit. {Vitals History (Optional):23777}  Physical Exam  No results found for any visits on 03/05/24.      Assessment & Plan:   ***  Assessment and Plan              No follow-ups on file.  Odilia Bennett, PA-C

## 2024-03-05 NOTE — Discharge Instructions (Addendum)
 Take over-the-counter Zyrtec, Claritin, or Allegra once daily to help with allergic symptoms.  Instill 2 squirts of fluticasone in each nostril at bedtime nightly.  And the nasal away from the septum of your nose and follow each set of squirts with 1 squirt of nasal saline to push the particles up into your turbinates where they will take effect.  Beginning tomorrow morning start the low-dose steroid pack and take it according to package instructions.  Continue to equalize your ears as shown to help clear mucus from eustachian tubes and maintain patency.

## 2024-03-05 NOTE — ED Triage Notes (Signed)
 Pt presents with bilateral ear pain x 2 weeks.

## 2024-03-05 NOTE — Telephone Encounter (Signed)
 Error

## 2024-03-05 NOTE — ED Provider Notes (Signed)
 [facility]    CSN: [id number] Arrival date & time: 03/05/24  1227      History   Chief Complaint Chief Complaint  Patient presents with   Otalgia    HPI Shannon Foley is a 38 y.o. female.   HPI  38 year old female with past medical history significant for pituitary cyst, motion sickness, obesity, headaches, fibroids, chronic constipation, and IBS presents for evaluation of pain in both of her ears that has been present for the last 2 weeks.  This is associated with muffled hearing and occasional ringing in her ears.  She denies any drainage from her ears, runny nose, or nasal congestion.  Past Medical History:  Diagnosis Date   Back pain    mid and lower back   Chronic abdominal pain    Chronic constipation    CIN I (cervical intraepithelial neoplasia I) 02/13/2019   On 02/2017 colposcopy     Endometrial polyp    Fibroids    one submucosal and one intramural (both  < 1 inch in size)   Headache    caused by pituitary cyst   IBS (irritable bowel syndrome)    Low grade squamous intraepithelial lesion (LGSIL) on cervical Pap smear 05/27/2018   With positive HRHPV     Missed abortion    Morbid obesity with BMI of 40.0-44.9, adult (HCC)    Motion sickness cars   Pituitary cyst (HCC)    Rathke's pouch cyst   Vitamin B12 deficiency    Vitamin D  deficiency     Patient Active Problem List   Diagnosis Date Noted   Thrombocytosis 10/19/2023   Tibialis posterior tendinopathy 07/13/2023   Vitamin D  deficiency 07/13/2022   Impaired fasting glucose 07/13/2022   BMI 40.0-44.9, adult (HCC) 06/11/2022   Recurrent pansinusitis 10/08/2020   Hiatal hernia 06/23/2018   Uterine fibroid 06/13/2018   IBS (irritable bowel syndrome) 05/29/2018   Gastroesophageal reflux disease without esophagitis 04/03/2018   Chronic midline low back pain without sciatica 04/03/2018   Chronic midline thoracic back pain 04/03/2018   Rathke's cleft cyst (HCC) 02/02/2016    Hyperprolactinemia (HCC) 04/29/2014   Vitamin B 12 deficiency 04/29/2014   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 04/29/2014   Pituitary cyst (HCC) 02/19/2014   Dupuytren's contracture of foot 12/18/2013   Plantar fasciitis 12/18/2013   Calcaneal bursitis 12/18/2013   Bilateral plantar fasciitis 12/18/2013    Past Surgical History:  Procedure Laterality Date   BREAST BIOPSY Right 06/29/2023   US  RT BREAST BX W LOC DEV 1ST LESION IMG BX SPEC US  GUIDE 06/29/2023 [facility] MAMMOGRAPHY   COLONOSCOPY     CYSTOSCOPY  05/10/2016   WNL   DILATION AND EVACUATION N/A 04/13/2020   Procedure: SUCTION DILATION AND CURETTAGE;  Surgeon: Darl Edu, MD;  Location: [facility];  Service: Gynecology;  Laterality: N/A;   ESOPHAGOGASTRODUODENOSCOPY (EGD) WITH PROPOFOL  N/A 02/18/2016   Procedure: ESOPHAGOGASTRODUODENOSCOPY (EGD) WITH PROPOFOL ;  Surgeon: Marnee Sink, MD;  Location: [facility];  Service: Endoscopy;  Laterality: N/A;   HYSTEROSCOPY WITH D & C N/A 12/11/2022   Procedure: HYSTEROSCOPY D&C;  Surgeon: Zenobia Hila, MD;  Location: [facility];  Service: Gynecology;  Laterality: N/A;   TONSILLECTOMY  2015    OB History     Gravida  1   Para  0   Term  0   Preterm  0   AB  1   Living  0      SAB  1   IAB  0   Ectopic  0   Multiple  0   Live Births  0            Home Medications    Prior to Admission medications   Medication Sig Start Date End Date Taking? Authorizing Provider  fluticasone (FLONASE) 50 MCG/ACT nasal spray Place 2 sprays into both nostrils daily. 03/05/24  Yes Kent Pear, NP  methylPREDNISolone (MEDROL DOSEPAK) 4 MG TBPK tablet Take according to the package insert. 03/05/24  Yes Kent Pear, NP  desogestrel -ethinyl estradiol  (VOLNEA ) 0.15-0.02/0.01 MG (21/5) tablet Take 1 tablet by mouth daily. Patient not taking: Reported on 01/31/2024    [provider]  Vitamin D , Ergocalciferol , (DRISDOL ) 1.25  MG (50000 UNIT) CAPS capsule TAKE 1 CAPSULE BY MOUTH EVERY 7 DAYS 01/09/24   Laneta Pintos, MD    Family History Family History  Problem Relation Age of Onset   Diabetes Mother    Hypertension Mother    Congestive Heart Failure Mother    CVA Father        Had meningioma   Diabetes Father    Hypertension Father    Bladder Cancer Paternal Aunt    Colon cancer Neg Hx    Esophageal cancer Neg Hx    Stomach cancer Neg Hx    Rectal cancer Neg Hx     Social History Social History   Tobacco Use   Smoking status: Never    Passive exposure: Never   Smokeless tobacco: Never  Vaping Use   Vaping status: Never Used  Substance Use Topics   Alcohol use: No   Drug use: No     Allergies   Patient has no known allergies.   Review of Systems Review of Systems  Constitutional:  Negative for fever.  HENT:  Positive for ear pain, hearing loss and tinnitus. Negative for congestion, ear discharge and rhinorrhea.      Physical Exam Triage Vital Signs ED Triage Vitals  Encounter Vitals Group     BP 03/05/24 1236 (!) 140/89     Girls Systolic BP Percentile --      Girls Diastolic BP Percentile --      Boys Systolic BP Percentile --      Boys Diastolic BP Percentile --      Pulse Rate 03/05/24 1236 97     Resp 03/05/24 1236 16     Temp 03/05/24 1236 98.5 F (36.9 C)     Temp Source 03/05/24 1236 Oral     SpO2 03/05/24 1236 96 %     Weight --      Height --      Head Circumference --      Peak Flow --      Pain Score 03/05/24 1235 6     Pain Loc --      Pain Education --      Exclude from Growth Chart --    No data found.  Updated Vital Signs BP (!) 140/89 (BP Location: Right Arm)   Pulse 97   Temp 98.5 F (36.9 C) (Oral)   Resp 16   LMP  (LMP Unknown)   SpO2 96%   Visual Acuity Right Eye Distance:   Left Eye Distance:   Bilateral Distance:    Right Eye Near:   Left Eye Near:    Bilateral Near:     Physical Exam Vitals and nursing note reviewed.   Constitutional:      Appearance: Normal  appearance. She is not ill-appearing.  HENT:     Head: Normocephalic and atraumatic.     Right Ear: Tympanic membrane, ear canal and external ear normal. There is no impacted cerumen.     Left Ear: Tympanic membrane, ear canal and external ear normal. There is no impacted cerumen.     Ears:     Comments: No tenderness with palpation of bilateral eustachian tubes externally.  Skin:    General: Skin is warm and dry.     Capillary Refill: Capillary refill takes less than 2 seconds.     Findings: No rash.   Neurological:     General: No focal deficit present.     Mental Status: She is alert and oriented to person, place, and time.      UC Treatments / Results  Labs (all labs ordered are listed, but only abnormal results are displayed) Labs Reviewed - No data to display  EKG   Radiology No results found.  Procedures Procedures (including critical care time)  Medications Ordered in UC Medications - No data to display  Initial Impression / Assessment and Plan / UC Course  I have reviewed the triage vital signs and the nursing notes.  Pertinent labs & imaging results that were available during my care of the patient were reviewed by me and considered in my medical decision making (see chart for details).   Patient is a pleasant, nontoxic-appearing 38 year old female presenting for evaluation of bilateral ear pain as outlined HPI above.  Her exam does not reveal the presence of otitis media or cerumen impaction.  She also does not have any tenderness with external palpation of bilateral eustachian tubes.  However, I did have the patient perform the Valsalva maneuver in clinic and she was unable to clear her eustachian tubes which suggest that she is suffering from eustachian tube dysfunction.  I will treat her for presumptive eustachian tube dysfunction with Flonase nasal spray at bedtime, low-dose steroid pack, and over-the-counter  antihistamines.  Of also encouraged her to try Perichlor throughout the day to open her eustachian tubes using the Valsalva maneuver.  If she does not have any improvement of her symptoms in the next week she should follow-up with ENT.  Contact information provided.   Final Clinical Impressions(s) / UC Diagnoses   Final diagnoses:  Eustachian tube dysfunction, bilateral     Discharge Instructions      Take over-the-counter Zyrtec, Claritin, or Allegra once daily to help with allergic symptoms.  Instill 2 squirts of fluticasone in each nostril at bedtime nightly.  And the nasal away from the septum of your nose and follow each set of squirts with 1 squirt of nasal saline to push the particles up into your turbinates where they will take effect.  Beginning tomorrow morning start the low-dose steroid pack and take it according to package instructions.  Continue to equalize your ears as shown to help clear mucus from eustachian tubes and maintain patency.       ED Prescriptions     Medication Sig Dispense Auth. Provider   methylPREDNISolone (MEDROL DOSEPAK) 4 MG TBPK tablet Take according to the package insert. 1 each Kent Pear, NP   fluticasone (FLONASE) 50 MCG/ACT nasal spray Place 2 sprays into both nostrils daily. 18.2 mL Kent Pear, NP      PDMP not reviewed this encounter.   Kent Pear, NP 03/05/24 1247

## 2024-04-10 ENCOUNTER — Encounter: Payer: Self-pay | Admitting: Obstetrics and Gynecology

## 2024-04-10 ENCOUNTER — Ambulatory Visit

## 2024-04-10 VITALS — BP 137/87 | HR 80 | Ht 63.0 in | Wt 252.5 lb

## 2024-04-10 DIAGNOSIS — R350 Frequency of micturition: Secondary | ICD-10-CM | POA: Diagnosis not present

## 2024-04-10 LAB — POCT URINALYSIS DIPSTICK
Bilirubin, UA: NEGATIVE
Blood, UA: NEGATIVE
Glucose, UA: NEGATIVE
Ketones, UA: NEGATIVE
Leukocytes, UA: NEGATIVE
Nitrite, UA: NEGATIVE
Protein, UA: POSITIVE — AB
Spec Grav, UA: 1.015 (ref 1.010–1.025)
Urobilinogen, UA: 0.2 U/dL
pH, UA: 5 (ref 5.0–8.0)

## 2024-04-10 NOTE — Progress Notes (Addendum)
    NURSE VISIT NOTE  Subjective:    Patient ID: Shannon Foley, female    DOB: 1986-02-05, 38 y.o.   MRN: [id number]       HPI  Patient is a 38 y.o. G28P0010 female who presents for urinary frequency and low back pain for 1 week.  Patient denies pelvic pain.  Patient does have a history of recurrent UTI.  Patient does not have a history of pyelonephritis.    Objective:    BP 137/87   Pulse 80   Ht 5' 3 (1.6 m)   Wt 252 lb 8 oz (114.5 kg)   LMP 01/11/2024 (Approximate)   BMI 44.73 kg/m    Lab Review  Results for orders placed or performed in visit on 04/10/24  POCT Urinalysis Dipstick  Result Value Ref Range   Color, UA     Clarity, UA     Glucose, UA Negative Negative   Bilirubin, UA Negative    Ketones, UA Negative    Spec Grav, UA 1.015 1.010 - 1.025   Blood, UA Negative    pH, UA 5.0 5.0 - 8.0   Protein, UA Positive (A) Negative   Urobilinogen, UA 0.2 0.2 or 1.0 E.U./dL   Nitrite, UA Negative    Leukocytes, UA Negative Negative   Appearance     Odor      Assessment:   1. Frequency of urination      Plan:   Urine Culture Sent.   Shannon Foley, CMA

## 2024-04-10 NOTE — Patient Instructions (Signed)
  Follow these instructions at home:   Take Ibuprofen  as directed on the package  Increase your water  intake  You may start taking AZO UTI treatment to relieve your symptoms as well.  General instructions  Make sure you: ? Pee often and fully. Do not hold your pee for a long time. ? Wipe from front to back after you pee or poop. Use each tissue only once when you wipe. ? Pee after you have sex.  Do not douche or use sprays or powders in your genital area.  Contact our office to be seen by a provider if:  Your symptoms don't go away or get worse after 1-2 days  You have a fever or chills. You vomit or feel like you may vomit.

## 2024-04-14 LAB — URINE CULTURE

## 2024-04-15 ENCOUNTER — Other Ambulatory Visit: Payer: Self-pay | Admitting: Certified Nurse Midwife

## 2024-04-15 ENCOUNTER — Ambulatory Visit: Payer: Self-pay | Admitting: Certified Nurse Midwife

## 2024-04-15 MED ORDER — NITROFURANTOIN MONOHYD MACRO 100 MG PO CAPS: 100.0000 mg | ORAL_CAPSULE | Freq: Two times a day (BID) | ORAL | 1 refills | Status: DC

## 2024-04-25 ENCOUNTER — Telehealth: Payer: Self-pay | Admitting: Internal Medicine

## 2024-04-25 NOTE — Telephone Encounter (Signed)
 Pt called to r/s 8/11 lab/MD appts.  Confirmed new date and time with pt.

## 2024-04-28 ENCOUNTER — Other Ambulatory Visit: Payer: Medicaid Other

## 2024-04-28 ENCOUNTER — Inpatient Hospital Stay: Payer: Medicaid Other | Admitting: Internal Medicine

## 2024-05-15 ENCOUNTER — Ambulatory Visit: Admitting: Obstetrics and Gynecology

## 2024-05-15 ENCOUNTER — Encounter: Payer: Self-pay | Admitting: Obstetrics and Gynecology

## 2024-05-15 ENCOUNTER — Other Ambulatory Visit (HOSPITAL_COMMUNITY)
Admission: RE | Admit: 2024-05-15 | Discharge: 2024-05-15 | Disposition: A | Discharge status: Home / Self Care | Source: Ambulatory Visit | Attending: Certified Nurse Midwife | Admitting: Certified Nurse Midwife

## 2024-05-15 VITALS — BP 135/88 | HR 91 | Ht 63.0 in | Wt 243.2 lb

## 2024-05-15 DIAGNOSIS — Z124 Encounter for screening for malignant neoplasm of cervix: Secondary | ICD-10-CM | POA: Diagnosis present

## 2024-05-15 DIAGNOSIS — N926 Irregular menstruation, unspecified: Secondary | ICD-10-CM | POA: Diagnosis not present

## 2024-05-15 DIAGNOSIS — N912 Amenorrhea, unspecified: Secondary | ICD-10-CM | POA: Diagnosis not present

## 2024-05-15 NOTE — Progress Notes (Signed)
 HPI:      [name] is a 38 y.o. G1P0010 who LMP was Patient's last menstrual period was 12/18/2023 (approximate).  Subjective:   She presents today saying that she continues to experience oligomenorrhea.  She did take OCPs for 3 months and only had 1 menses during that time. When questioned further and discussing hormonal testing I mention the pituitary and she informed us  that she has a pituitary cyst that often causes her headaches.  She is being followed by [facility] for this issue.  She is not aware if she has elevated prolactin.  She is not taking any medication for the cyst.    Hx: The following portions of the patient's history were reviewed and updated as appropriate:             She  has a past medical history of Back pain, Chronic abdominal pain, Chronic constipation, CIN I (cervical intraepithelial neoplasia I) (02/13/2019), Endometrial polyp, Fibroids, Headache, IBS (irritable bowel syndrome), Low grade squamous intraepithelial lesion (LGSIL) on cervical Pap smear (05/27/2018), Missed abortion, Morbid obesity with BMI of 40.0-44.9, adult (HCC), Motion sickness (cars), Pituitary cyst (HCC), Vitamin B12 deficiency, and Vitamin D  deficiency. She does not have any pertinent problems on file. She  has a past surgical history that includes Tonsillectomy (2015); Esophagogastroduodenoscopy (egd) with propofol  (N/A, 02/18/2016); Cystoscopy (05/10/2016); Colonoscopy; Dilation and evacuation (N/A, 04/13/2020); Hysteroscopy with D & C (N/A, 12/11/2022); and Breast biopsy (Right, 06/29/2023). Her family history includes Bladder Cancer in her paternal aunt; CVA in her father; Congestive Heart Failure in her mother; Diabetes in her father and mother; Hypertension in her father and mother. She  reports that she has never smoked. She has never been exposed to tobacco smoke. She has never used smokeless tobacco. She reports that she does not drink alcohol and does not use drugs. She has a current  medication list which includes the following prescription(s): desogestrel -ethinyl estradiol , fluticasone , methylprednisolone , and vitamin d  (ergocalciferol ). She has no known allergies.       Review of Systems:  Review of Systems  Constitutional: Denied constitutional symptoms, night sweats, recent illness, fatigue, fever, insomnia and weight loss.  Eyes: Denied eye symptoms, eye pain, photophobia, vision change and visual disturbance.  Ears/Nose/Throat/Neck: Denied ear, nose, throat or neck symptoms, hearing loss, nasal discharge, sinus congestion and sore throat.  Cardiovascular: Denied cardiovascular symptoms, arrhythmia, chest pain/pressure, edema, exercise intolerance, orthopnea and palpitations.  Respiratory: Denied pulmonary symptoms, asthma, pleuritic pain, productive sputum, cough, dyspnea and wheezing.  Gastrointestinal: Denied, gastro-esophageal reflux, melena, nausea and vomiting.  Genitourinary: See HPI for additional information.  Musculoskeletal: Denied musculoskeletal symptoms, stiffness, swelling, muscle weakness and myalgia.  Dermatologic: Denied dermatology symptoms, rash and scar.  Neurologic: Denied neurology symptoms, dizziness, headache, neck pain and syncope.  Psychiatric: Denied psychiatric symptoms, anxiety and depression.  Endocrine: See HPI for additional information.   Meds:   Current Outpatient Medications on File Prior to Visit  Medication Sig Dispense Refill   desogestrel -ethinyl estradiol  (VOLNEA ) 0.15-0.02/0.01 MG (21/5) tablet Take 1 tablet by mouth daily. (Patient not taking: Reported on 05/15/2024)     fluticasone  (FLONASE ) 50 MCG/ACT nasal spray Place 2 sprays into both nostrils daily. 18.2 mL 1   methylPREDNISolone  (MEDROL  DOSEPAK) 4 MG TBPK tablet Take according to the package insert. 1 each 0   Vitamin D , Ergocalciferol , (DRISDOL ) 1.25 MG (50000 UNIT) CAPS capsule TAKE 1 CAPSULE BY MOUTH EVERY 7 DAYS 12 capsule 0   No current facility-administered  medications on file prior  to visit.      Objective:     Vitals:   05/15/24 1055  BP: 135/88  Pulse: 91   Filed Weights   05/15/24 1055  Weight: 243 lb 3.2 oz (110.3 kg)              Physical examination   Pelvic:   Vulva: Normal appearance.  No lesions.  Vagina: No lesions or abnormalities noted.  Support: Normal pelvic support.  Urethra No masses tenderness or scarring.  Meatus Normal size without lesions or prolapse.  Cervix: Normal appearance.  No lesions.  Anus: Normal exam.  No lesions.  Perineum: Normal exam.  No lesions.        Bimanual   Uterus: Normal size.  Non-tender.  Mobile.  AV.  Adnexae: No masses.  Non-tender to palpation.  Cul-de-sac: Negative for abnormality.             Assessment:    G1P0010 Patient Active Problem List   Diagnosis Date Noted   Thrombocytosis 10/19/2023   Tibialis posterior tendinopathy 07/13/2023   Vitamin D  deficiency 07/13/2022   Impaired fasting glucose 07/13/2022   BMI 40.0-44.9, adult (HCC) 06/11/2022   Recurrent pansinusitis 10/08/2020   Hiatal hernia 06/23/2018   Uterine fibroid 06/13/2018   IBS (irritable bowel syndrome) 05/29/2018   Gastroesophageal reflux disease without esophagitis 04/03/2018   Chronic midline low back pain without sciatica 04/03/2018   Chronic midline thoracic back pain 04/03/2018   Rathke's cleft cyst (HCC) 02/02/2016   Hyperprolactinemia (HCC) 04/29/2014   Vitamin B 12 deficiency 04/29/2014   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 04/29/2014   Pituitary cyst (HCC) 02/19/2014   Dupuytren's contracture of foot 12/18/2013   Plantar fasciitis 12/18/2013   Calcaneal bursitis 12/18/2013   Bilateral plantar fasciitis 12/18/2013     1. Irregular periods   2. Cervical cancer screening   3. Amenorrhea     Notes from [facility] show a pituitary adenoma is present.  I cannot find any testing for prolactin levels.  This could certainly be a cause of her oligo  menorrhea.   Plan:            1.  Hormonal testing with specific attention to prolactin. 2.  Pap performed Orders Orders Placed This Encounter  Procedures   Anti mullerian hormone   DHEA-sulfate   FSH/LH   Insulin, random   Prolactin   Testosterone, Free, Total, SHBG   TSH   Glucose, random    No orders of the defined types were placed in this encounter.     F/U  Return for We will contact her with any abnormal test results.  Alm DOROTHA Sar, M.D. 05/15/2024 11:15 AM

## 2024-05-15 NOTE — Progress Notes (Signed)
 Patient presents today to discuss amenorrhea. She reports having one cycle since 11/2022. Currently is not sexually active or using any kind of cycle control. Pap smear due and ordered today.

## 2024-05-20 LAB — CYTOLOGY - PAP
Comment: NEGATIVE
Diagnosis: NEGATIVE
High risk HPV: NEGATIVE

## 2024-05-21 ENCOUNTER — Inpatient Hospital Stay: Admitting: Internal Medicine

## 2024-05-21 ENCOUNTER — Inpatient Hospital Stay

## 2024-05-22 LAB — TESTOSTERONE, FREE, TOTAL, SHBG
Sex Hormone Binding: 31.1 nmol/L (ref 24.6–122.0)
Testosterone, Free: 0.2 pg/mL (ref 0.0–4.2)
Testosterone: 12 ng/dL (ref 8–60)

## 2024-05-22 LAB — INSULIN, RANDOM: INSULIN: 17.5 u[IU]/mL (ref 2.6–24.9)

## 2024-05-22 LAB — FSH/LH
FSH: 2.8 m[IU]/mL
LH: 8.4 m[IU]/mL

## 2024-05-22 LAB — PROLACTIN: Prolactin: 15.1 ng/mL (ref 4.8–33.4)

## 2024-05-22 LAB — DHEA-SULFATE: DHEA-SO4: 102 ug/dL (ref 57.3–279.2)

## 2024-05-22 LAB — TSH: TSH: 2.69 u[IU]/mL (ref 0.450–4.500)

## 2024-05-22 LAB — ANTI MULLERIAN HORMONE: ANTI-MULLERIAN HORMONE (AMH): 0.301 ng/mL

## 2024-05-30 ENCOUNTER — Ambulatory Visit: Payer: Self-pay

## 2024-06-06 NOTE — Progress Notes (Signed)
 GYNECOLOGY PROGRESS NOTE  Subjective:  PCP: Wallace Joesph LABOR, PA  Patient ID: Shannon Foley, female    DOB: 12-19-1985, 38 y.o.   MRN: [id number]  HPI  Patient is a 38 y.o. G72P0010 female who presents for oligomenorrhea since 12/18/23.  She reports having regular, monthly periods, light flow, not painful since menarche at 38yo for most of her life. Developed heavy bleeding in Sep '23.  Saw Dr. Starla in Nov '23 and found to have fibroids and thickened endometrium; s/p [facility] w/D&C with Dr. Janit 12/11/22, pathology revealed a benign endometrial polyp with simple hyperplasia.  She was put on Megace  for 3 mos (Apr-Jun '24) and remained amenorrheic through Dec '24, so Dr. Janit started combined OCPs for 3 mos (Jan-Mar '25) and after stopping, had menses on 12/18/23, which is the last time she had a period, nothing since. She is currently not taking any hormones or contraception and states again, as in previous notes, that she desires future pregnancy, not actively TTC at the moment, is not sexually active or partnered. Last saw Dr. Janit for amenorrhea 04/19/24 and due to PMH of pituitary adenoma, he checked her hormones (WNL) and did a pap (NILM HPV negative.)  Last pelvic ultrasound in Jan '25 for low abdominal pain, fibroids noted and R ovary measuring 27cm.   The following portions of the patient's history were reviewed and updated as appropriate: allergies, current medications, past family history, past medical history, past social history, past surgical history, and problem list.  Review of Systems Pertinent items are noted in HPI.   Objective:   Blood pressure (!) 142/76, pulse 95, weight 246 lb 8 oz (111.8 kg), last menstrual period 12/18/2023. Body mass index is 43.67 kg/m.  General appearance: alert and cooperative Abdomen: soft, non-tender; bowel sounds normal; no masses,  no organomegaly Pelvic: cervix normal in appearance, external genitalia normal, no adnexal masses or tenderness, no  cervical motion tenderness, rectovaginal septum normal, uterus normal size, shape, and consistency, and vagina normal without discharge Extremities: extremities normal, atraumatic, no cyanosis or edema Neurologic: Grossly normal   12/11/22 PATHOLOGY from [facility] w/D&C Specimen Submitted:  A. Endometrial curettings   Clinical History: Endometrial polyp   DIAGNOSIS:  A. ENDOMETRIUM; CURETTAGE:  - FRAGMENTS OF BENIGN ENDOMETRIAL POLYP WITH ASSOCIATED  NON-ATYPICAL/SIMPLE HYPERPLASIA.  - SUPERFICIAL PORTIONS OF BENIGN ENDO AND ECTOCERVICAL TISSUE.  - NEGATIVE FOR ATYPICAL HYPERPLASIA/EIN AND MALIGNANCY.   10/05/23 US  PELVIC EXAM: TRANSABDOMINAL ULTRASOUND OF PELVIS   TECHNIQUE: Transabdominal ultrasound examination of the pelvis was performed including evaluation of the uterus, ovaries, adnexal regions, and pelvic cul-de-sac.   COMPARISON:  August 07, 2022   FINDINGS: Uterus Measurements: 10.3 x 5.2 x 6.5 cm = volume: 182.8 mL. Three uterine fibroids are noted, measuring 2.8 x 2.9 x 2.9 cm, 2.3 x 2.5 x 2.1 cm, 1 x 1 x 1 cm. The largest uterine fibroid is in the posterior right myometrium.   Endometrium Thickness: 10 mm.  No focal abnormality visualized.   Right ovary Measurements: 5.4 x 3.5 x 2.9 cm = volume: 29.3 mL. Normal appearance/no adnexal mass.   Left ovary Not visualized.   Other findings:  No abnormal free fluid.   IMPRESSION: 1. No acute abnormality identified. 2. Uterine fibroids.   Recent Results (from the past 2160 hours)  POCT Urinalysis Dipstick     Status: Abnormal   Collection Time: 04/10/24 10:54 AM  Result Value Ref Range   Color, UA     Clarity, UA  Glucose, UA Negative Negative   Bilirubin, UA Negative    Ketones, UA Negative    Spec Grav, UA 1.015 1.010 - 1.025   Blood, UA Negative    pH, UA 5.0 5.0 - 8.0   Protein, UA Positive (A) Negative   Urobilinogen, UA 0.2 0.2 or 1.0 E.U./dL   Nitrite, UA Negative    Leukocytes, UA Negative  Negative   Appearance     Odor    Urine Culture     Status: Abnormal   Collection Time: 04/10/24 11:44 AM   Specimen: Urine   UR  Result Value Ref Range   Urine Culture, Routine Final report (A)    Organism ID, Bacteria Escherichia coli (A)     Comment: Cefazolin with an MIC <=16 predicts susceptibility to the oral agents cefaclor, cefdinir, cefpodoxime, cefprozil, cefuroxime, cephalexin , and loracarbef when used for therapy of uncomplicated urinary tract infections due to E. coli, Klebsiella pneumoniae, and Proteus mirabilis. Greater than 100,000 colony forming units per mL    Antimicrobial Susceptibility Comment     Comment:       ** S = Susceptible; I = Intermediate; R = Resistant **                    P = Positive; N = Negative             MICS are expressed in micrograms per mL    Antibiotic                 RSLT#1    RSLT#2    RSLT#3    RSLT#4 Amoxicillin /Clavulanic Acid    I Ampicillin                      R Cefazolin                      S Cefepime                       S Cefoxitin                      S Cefpodoxime                    S Ceftriaxone                    S Ciprofloxacin                   S Ertapenem                      S Gentamicin                     S Levofloxacin                   I Meropenem                      S Nitrofurantoin                  S Piperacillin/Tazobactam        S Tetracycline                   S Tobramycin                     S Trimethoprim /Sulfa   S   Cytology - PAP     Status: None   Collection Time: 05/15/24 10:57 AM  Result Value Ref Range   High risk HPV Negative    Adequacy      Satisfactory for evaluation; transformation zone component PRESENT.   Diagnosis      - Negative for intraepithelial lesion or malignancy (NILM)   Comment Normal Reference Range HPV - Negative   Anti mullerian hormone     Status: None   Collection Time: 05/15/24 11:13 AM  Result Value Ref Range   ANTI-MULLERIAN HORMONE (AMH) 0.301 ng/mL     Comment: For assays employing antibodies, the possibility exists for interference by heterophile antibodies in the samples.1  1.Kricka L.  Interferences in Immunoassays - still a threat.  Clin. Chem. 2000; 46: 8962-8961.  This test was developed and its performance characteristics determined by [facility]. It has not been cleared or approved by the Food and Drug Administration. Reference Range: Females 36 - 40y: 0.42 - 8.34 Median  1.69 AMH concentrations of >= 1.06 ng/mL is correlated with a better response to ovarian stimulation, produced more retrievable oocytes and higher odds of live birth according to Gleicher et al.  Luanna and Sterility. 2010: 05:7175-7172.  The current AMH test method correlates with the study method with a slope of 0.94. Females at risk of ovarian hyperstimulation syndrome or polycystic ovarian syndrome (PCOS) may exhibit elevated serum AMH concentrations.   AMH levels from PCOS patients may be 2 to 5 fold higher than age-appropriate reference in terval values. Granulosa cell tumors of the ovary may secrete AMH along with other tumor markers.  Elevated AMH is not specific for malignancy, and the assay should not be used exclusively to diagnose or exclude an AMH-secreting ovarian tumor.   DHEA-sulfate     Status: None   Collection Time: 05/15/24 11:13 AM  Result Value Ref Range   DHEA-SO4 102.0 57.3 - 279.2 ug/dL  FSH/LH     Status: None   Collection Time: 05/15/24 11:13 AM  Result Value Ref Range   LH 8.4 mIU/mL    Comment:                      Adult Female              Range                       Follicular phase      2.4 -  12.6                       Ovulation phase      14.0 -  95.6                       Luteal phase          1.0 -  11.4                       Postmenopausal        7.7 -  58.5    FSH 2.8 mIU/mL    Comment:                      Adult Female             Range  Follicular phase      3.5 -  12.5                        Ovulation phase       4.7 -  21.5                       Luteal phase          1.7 -   7.7                       Postmenopausal       25.8 - 134.8   Insulin , random     Status: None   Collection Time: 05/15/24 11:13 AM  Result Value Ref Range   INSULIN  17.5 2.6 - 24.9 uIU/mL  Prolactin     Status: None   Collection Time: 05/15/24 11:13 AM  Result Value Ref Range   Prolactin 15.1 4.8 - 33.4 ng/mL  Testosterone , Free, Total, SHBG     Status: None   Collection Time: 05/15/24 11:13 AM  Result Value Ref Range   Testosterone  12 8 - 60 ng/dL   Testosterone , Free 0.2 0.0 - 4.2 pg/mL   Sex Hormone Binding 31.1 24.6 - 122.0 nmol/L  TSH     Status: None   Collection Time: 05/15/24 11:13 AM  Result Value Ref Range   TSH 2.690 0.450 - 4.500 uIU/mL    Assessment/Plan:   1. Oligomenorrhea, unspecified type   2. Uterine leiomyoma, unspecified location   3. Endometrial hyperplasia, simple     38 y.o. G1P0010 with PMH of AUB due to fibroids, endometrial polyp, and benign EH, s/p [facility] w/D&C and Megace , then COCs, now amenorrheic since LMP of 12/18/23. Also hx of pituitary adenoma, labs checked 05/15/24 and WNL. Pt desiring future pregnancy.  -Long discussion re: pt's fertility and dx of fibroids, benign EH, and currently with 5 mos of no menses working against her. She also does not have a partner, aware will need sperm donor. Dempsey discussion about her egg quality declining and overall health (BMI) is not optimized. She can start working on lifestyle modifications now and will order referral to [facility] so she can have a discussion re: IVF with her circumstances. Time is of the essence and encouraged her to stop waiting, may consider egg donor or gestational carrier if possible.  -For her missed periods, suspect possible PCOS scenario due to R ovary measuring > 10cm on US  Jan '25. EH may have returned and we discussed potential need for another EMB. Will obtain new US  and schedule follow up 1-2 weeks  after imaging complete to review results together. In the meantime, will do a 10-day course of Provera  to induce menses, SE and dosing reviewed.   Total time was 36 minutes. That includes chart review before the visit, the actual patient visit, and time spent on documentation after the visit. Time excludes procedures, if any.    Follow up 1-2 wks after US  completed, sooner prn.    Estil Mangle, DO  [facility]

## 2024-06-10 ENCOUNTER — Encounter: Payer: Self-pay | Admitting: Obstetrics

## 2024-06-10 ENCOUNTER — Ambulatory Visit (INDEPENDENT_AMBULATORY_CARE_PROVIDER_SITE_OTHER): Admitting: Obstetrics

## 2024-06-10 VITALS — BP 142/76 | HR 95 | Wt 246.5 lb

## 2024-06-10 DIAGNOSIS — D259 Leiomyoma of uterus, unspecified: Secondary | ICD-10-CM

## 2024-06-10 DIAGNOSIS — N8501 Benign endometrial hyperplasia: Secondary | ICD-10-CM | POA: Insufficient documentation

## 2024-06-10 DIAGNOSIS — N915 Oligomenorrhea, unspecified: Secondary | ICD-10-CM | POA: Insufficient documentation

## 2024-06-10 MED ORDER — MEDROXYPROGESTERONE ACETATE 10 MG PO TABS: 10.0000 mg | ORAL_TABLET | Freq: Every day | ORAL | 0 refills | Status: DC

## 2024-06-16 ENCOUNTER — Ambulatory Visit
Admission: RE | Admit: 2024-06-16 | Discharge: 2024-06-16 | Disposition: A | Discharge status: Home / Self Care | Source: Ambulatory Visit | Attending: Obstetrics | Admitting: Obstetrics

## 2024-06-16 DIAGNOSIS — N915 Oligomenorrhea, unspecified: Secondary | ICD-10-CM | POA: Diagnosis present

## 2024-06-17 ENCOUNTER — Ambulatory Visit: Payer: Self-pay | Admitting: Obstetrics

## 2024-06-19 NOTE — Patient Instructions (Signed)
 Endometrial Biopsy  An endometrial biopsy is a procedure where a tissue sample is removed from the lining of the uterus. This lining is called the endometrium. The tissue sample is then sent to a lab for testing. You may have this type of biopsy to check for: Cancer. Infection. Growths called polyps. Uterine bleeding that can't be explained. Tell a health care provider about: Any allergies you have. All medicines you're taking including vitamins, herbs, eye drops, creams, and over-the-counter medicines. Any problems you or family members have had with anesthesia. Any bleeding problems you have. Any surgeries you have had. Any medical problems you have. Whether you're pregnant or may be pregnant. What are the risks? Your health care provider will talk with you about risks. These may include: Bleeding. Infection. Allergic reactions to medicines. Damage to the wall of the uterus. This is rare. What happens before the procedure? Keep track of your period. You may need to have this biopsy when you're not having your period. Ask your provider about: Changing or stopping your regular medicines. These include any diabetes medicines or blood thinners you take. Taking medicines such as aspirin and ibuprofen . These medicines can thin your blood. Do not take them unless your provider tells you to. Taking over-the-counter medicines, vitamins, herbs, and supplements. Bring a pad with you. You may need to wear one after the biopsy. Plan to have a responsible adult take you home from the hospital or clinic. You won't be allowed to drive. What happens during the procedure? A tool will be put into your vagina to hold it open. This helps your provider see the cervix. The cervix is the lowest part of the uterus. Your cervix will be cleaned with a solution that kills germs. You will be given anesthesia. This keeps you from feeling pain. It will numb your cervix. A tool called forceps will be used to  hold your cervix steady. A thin tool called a uterine sound will be put through your cervix. It will be used to: Find the length of your uterus. Find where to take the sample from. A soft tube called a catheter will be put into your uterus. The catheter will remove a tissue sample. The tube and tools will be removed. The sample will be sent to a lab for testing. The procedure may vary among providers and hospitals. What happens after the procedure? Your blood pressure, heart rate, breathing rate, and blood oxygen level will be monitored until you leave the hospital or clinic. It's up to you to get the results of your procedure. Ask your provider, or the department that is doing the procedure, when your results will be ready. This information is not intended to replace advice given to you by your health care provider. Make sure you discuss any questions you have with your health care provider. Document Revised: 11/14/2022 Document Reviewed: 11/14/2022 Elsevier Patient Education  2024 Else[employer].Fibroids in the Uterus: What to Know  Fibroids are growths (tumors) in the uterus. Fibroids are not cancer. Most people with this condition do not need treatment. Sometimes, fibroids can make it hard to get pregnant. If this happens, you may need surgery to take out the fibroids. What are the causes? The cause of this condition is not known. What increases the risk? You're in your 30s or 40s and have not stopped having a period. You have family members who have had fibroids. You're of African American descent. You started your period at age 45 or younger. You've not given  birth. You're overweight or you're obese. You eat a diet low in fruits, vegetables, and vitamin D . What are the signs or symptoms? Heavy bleeding during your period. Bleeding between periods. Pain in the area between your hips (pelvis). Pain during sex. Needing to pee right away or peeing more often than usual. Not being  able to have children. Not being able to stay pregnant (miscarriage). Many people do not have symptoms. How is this treated? Treatment may include: Medicines to help with pain, such as aspirin or ibuprofen . Hormone treatment. This may be given as a pill, in a shot, or with a type of birth control device called an IUD. Surgery. This may be done to: Take out the fibroids. This may be done if you want to become pregnant. Take out the uterus. Stop the blood flow to the fibroids. Procedures to shrink the fibroids. This may be done with: Heat and radio energy. Ultrasound waves. Follow these instructions at home: Medicines Take your medicines only as told. You can lose a lot of iron because of heavy bleeding from your period. Ask your doctor if you should: Take iron pills. Eat more foods that have iron in them, such as dark green, leafy vegetables. Managing pain  Put heat on your back or belly as told. Use the heat source that your doctor recommends, such as a moist heat pack or a heating pad. Do this as often as told. Put a towel between your skin and the heat source. Leave the heat on for 20-30 minutes. If your skin turns red, take off the heat right away to prevent burns. The risk of burns is higher if you can't feel pain, heat, or cold. General instructions Tell your doctor about any changes in your period, such as: Heavy bleeding that needs a change of tampons or pads more than normal. A change in how many days your period lasts. A change in symptoms that come with your period. This might be cramps in your belly or pain in your back. Keep all follow-up visits. Your doctor needs to check your fibroids for any changes. Contact a doctor if: You have pain that does not get better with medicine or heat. This may include pain or cramps in: The area between your hip bones. Your back. Your belly. You have new bleeding between your periods. You have more bleeding during or between your  periods. You feel very tired or weak. You feel dizzy. Get help right away if: You faint. You have pain in the area between your hip bones that gets worse. You have bleeding that soaks a tampon or pad in 30 minutes or less. This information is not intended to replace advice given to you by your health care provider. Make sure you discuss any questions you have with your health care provider. Document Revised: 03/06/2023 Document Reviewed: 03/06/2023 Elsevier Patient Education  2024 [employer].

## 2024-06-19 NOTE — Progress Notes (Signed)
 GYNECOLOGY PROGRESS NOTE  Subjective:  PCP: Wallace Joesph LABOR, PA  Patient ID: Shannon Foley Meals, female    DOB: [birth year], 38 y.o.   MRN: [id number]  HPI  Patient is a 38 y.o. G1P0010 with PMH of AUB due to fibroids/endometrial polyps, and benign EH s/p D&C (11/2022) and Megace , then COCs, who presents for follow-up on amenorrhea since April 2025. We had obtained a new pelvic US  showing her fibroids and microcystic changes in the endometrium, today she is prepared for an EMB. At last visit, we prescribed Provera  to induce a period, started on 06/11/24 and started bleeding on 06/16/24, is not heavy, is still currently lightly bleeding.   Pt also desiring pregnancy, we had previously discussed the many barriers against her, and will need IVF, is without a partner. She has upcoming visit with REI on 07/21/24.    The following portions of the patient's history were reviewed and updated as appropriate: allergies, current medications, past family history, past medical history, past social history, past surgical history, and problem list.  Review of Systems Pertinent items are noted in HPI.   Objective:   Blood pressure 124/82, pulse 87, height 5' 3 (1.6 m), weight 243 lb (110.2 kg), last menstrual period 06/16/2024. Body mass index is 43.05 kg/m.  General appearance: alert and cooperative Abdomen: soft, non-tender; bowel sounds normal; no masses,  no organomegaly Pelvic: cervix normal in appearance, external genitalia normal, no adnexal masses or tenderness, no cervical motion tenderness, rectovaginal septum normal, uterus normal size, shape, and consistency, and vagina normal without discharge Extremities: extremities normal, atraumatic, no cyanosis or edema Neurologic: Grossly normal  Ultrasound 06/16/24 FINDINGS: Uterus   Measurements: 8.1 x 5.7 x 5.7 cm = volume: 136.9 mL. Uterus is anteverted. Three distinct fibroids are again seen as follows;   1. 3.6 x 3.0 x 3.5 cm intramural  to submucosal fibroid present at the posterior uterine body. 2. 1.1 x 1.0 x 1.1 cm subserosal/exophytic fibroid extends from the uterine fundus. 3. 2.6 x 2.8 x 3.3 cm subserosal fibroid at the anterior uterine body.   Endometrium   Thickness: 10 mm. Endometrial stripe demonstrates internal microcystic change. Endometrial stripe itself is partially deformed by the adjacent submucosal fibroid. Well defined endometrial-myometrial interface. No visible focal abnormality or abnormal vascularity.   Right ovary   Measurements: 2.5 x 1.1 x 2.0 cm = volume: 2.8 mL. Normal appearance/no adnexal mass.   Left ovary   Measurements: 2.7 x 1.4 x 2.1 cm = volume: 4.1 mL. Normal appearance/no adnexal mass.   Other findings   No abnormal free fluid.   IMPRESSION: 1. Three distinct uterine fibroids measuring up to 3.6 cm as detailed above. 2. Microcystic changes within the endometrial complex, nonspecific, but can be seen the setting of endometrial hyperplasia. No visible endometrial polyp or other focal abnormality. Appearance is similar as compared to previous studies. 3. Normal sonographic appearance of the ovaries. No adnexal mass or free fluid.   Endometrial Biopsy Procedure Note  The patient was positioned on the exam table in the dorsal lithotomy position. Bimanual exam confirmed uterine position and size. A Graves speculum was placed into the vagina. A single toothed tenaculum was placed onto the anterior lip of the cervix. The pipette was placed into the endocervical canal and advanced to the uterine fundus. Using a piston like technique, with vacuum created by withdrawing the stylus, the endometrial specimen was obtained and transferred to the biopsy container. Minimal bleeding encountered. The procedure was well tolerated.  Uterine Position: mid   Uterine Length: 7cm   Uterine Specimen: Average   Assessment/Plan:   1. Oligomenorrhea, unspecified type   2. Fibroids   3.  Abnormal uterine bleeding (AUB)   4. BMI 40.0-44.9, adult (HCC)     38 y.o. G1P0010, PMH of AUB due to fibroids, endometrial polyp and benign EH, s/p HSC w/D&C (11/2022) with subsequent Megace , then COCs, with amenorrhea from Apr '25 until Sep '25, after period induction with Provera . TVUS shows pt's fibroids as above, and microcystic changes in the endometrium that could be indicative of EH. EMB performed today. Will re-start Provera  while awaiting EMB results. Pt to see REI 07/21/24 to discuss if she's an IVF candidate, prepared her to receive contrary news, as her uterus is currently not healthy as a womb, and her overall physical health is poor. Follow up 1 week after REI consultation.    Total time was 35 minutes. That includes chart review before the visit, the actual patient visit, and time spent on documentation after the visit. Time excludes procedures, if any.    Estil Mangle, DO [facility]

## 2024-06-24 ENCOUNTER — Encounter: Payer: Self-pay | Admitting: Internal Medicine

## 2024-06-24 ENCOUNTER — Ambulatory Visit (INDEPENDENT_AMBULATORY_CARE_PROVIDER_SITE_OTHER): Admitting: Obstetrics

## 2024-06-24 ENCOUNTER — Inpatient Hospital Stay: Attending: Internal Medicine

## 2024-06-24 ENCOUNTER — Encounter: Payer: Self-pay | Admitting: Obstetrics

## 2024-06-24 ENCOUNTER — Inpatient Hospital Stay (HOSPITAL_BASED_OUTPATIENT_CLINIC_OR_DEPARTMENT_OTHER): Admitting: Internal Medicine

## 2024-06-24 ENCOUNTER — Other Ambulatory Visit (HOSPITAL_COMMUNITY)
Admission: RE | Admit: 2024-06-24 | Discharge: 2024-06-24 | Disposition: A | Discharge status: Home / Self Care | Source: Ambulatory Visit

## 2024-06-24 VITALS — BP 124/82 | HR 87 | Ht 63.0 in | Wt 243.0 lb

## 2024-06-24 VITALS — BP 134/87 | HR 71 | Temp 97.4°F | Resp 16 | Ht 63.0 in | Wt 242.3 lb

## 2024-06-24 DIAGNOSIS — E559 Vitamin D deficiency, unspecified: Secondary | ICD-10-CM | POA: Insufficient documentation

## 2024-06-24 DIAGNOSIS — D75839 Thrombocytosis, unspecified: Secondary | ICD-10-CM | POA: Insufficient documentation

## 2024-06-24 DIAGNOSIS — N8501 Benign endometrial hyperplasia: Secondary | ICD-10-CM | POA: Insufficient documentation

## 2024-06-24 DIAGNOSIS — N939 Abnormal uterine and vaginal bleeding, unspecified: Secondary | ICD-10-CM | POA: Diagnosis not present

## 2024-06-24 DIAGNOSIS — K760 Fatty (change of) liver, not elsewhere classified: Secondary | ICD-10-CM | POA: Diagnosis not present

## 2024-06-24 DIAGNOSIS — D259 Leiomyoma of uterus, unspecified: Secondary | ICD-10-CM

## 2024-06-24 DIAGNOSIS — D219 Benign neoplasm of connective and other soft tissue, unspecified: Secondary | ICD-10-CM

## 2024-06-24 DIAGNOSIS — N840 Polyp of corpus uteri: Secondary | ICD-10-CM

## 2024-06-24 DIAGNOSIS — N915 Oligomenorrhea, unspecified: Secondary | ICD-10-CM

## 2024-06-24 DIAGNOSIS — Z6841 Body Mass Index (BMI) 40.0 and over, adult: Secondary | ICD-10-CM

## 2024-06-24 LAB — CMP (CANCER CENTER ONLY)
ALT: 21 U/L (ref 0–44)
AST: 29 U/L (ref 15–41)
Albumin: 3.9 g/dL (ref 3.5–5.0)
Alkaline Phosphatase: 82 U/L (ref 38–126)
Anion gap: 7 (ref 5–15)
BUN: 9 mg/dL (ref 6–20)
CO2: 22 mmol/L (ref 22–32)
Calcium: 8.6 mg/dL — ABNORMAL LOW (ref 8.9–10.3)
Chloride: 106 mmol/L (ref 98–111)
Creatinine: 0.85 mg/dL (ref 0.44–1.00)
GFR, Estimated: >60 mL/min (ref >60)
Glucose, Bld: 92 mg/dL (ref 70–99)
Potassium: 3.8 mmol/L (ref 3.5–5.1)
Sodium: 135 mmol/L (ref 135–145)
Total Bilirubin: 0.7 mg/dL (ref 0.0–1.2)
Total Protein: 7.6 g/dL (ref 6.5–8.1)

## 2024-06-24 LAB — CBC WITH DIFFERENTIAL (CANCER CENTER ONLY)
Abs Immature Granulocytes: 0.03 K/uL (ref 0.00–0.07)
Basophils Absolute: 0 K/uL (ref 0.0–0.1)
Basophils Relative: 1 %
Eosinophils Absolute: 0.2 K/uL (ref 0.0–0.5)
Eosinophils Relative: 2 %
HCT: 40 % (ref 36.0–46.0)
Hemoglobin: 13.7 g/dL (ref 12.0–15.0)
Immature Granulocytes: 0 %
Lymphocytes Relative: 32 %
Lymphs Abs: 2.9 K/uL (ref 0.7–4.0)
MCH: 30.3 pg (ref 26.0–34.0)
MCHC: 34.3 g/dL (ref 30.0–36.0)
MCV: 88.5 fL (ref 80.0–100.0)
Monocytes Absolute: 0.8 K/uL (ref 0.1–1.0)
Monocytes Relative: 9 %
Neutro Abs: 5 K/uL (ref 1.7–7.7)
Neutrophils Relative %: 56 %
Platelet Count: 421 K/uL — ABNORMAL HIGH (ref 150–400)
RBC: 4.52 MIL/uL (ref 3.87–5.11)
RDW: 13.2 % (ref 11.5–15.5)
WBC Count: 8.9 K/uL (ref 4.0–10.5)
nRBC: 0 % (ref 0.0–0.2)

## 2024-06-24 LAB — IRON AND TIBC
Iron: 52 ug/dL (ref 28–170)
Saturation Ratios: 19 % (ref 10.4–31.8)
TIBC: 279 ug/dL (ref 250–450)
UIBC: 227 ug/dL

## 2024-06-24 LAB — FERRITIN: Ferritin: 67 ng/mL (ref 11–307)

## 2024-06-24 MED ORDER — MEDROXYPROGESTERONE ACETATE 10 MG PO TABS: 10.0000 mg | ORAL_TABLET | Freq: Every day | ORAL | 0 refills | Status: DC

## 2024-06-24 NOTE — Assessment & Plan Note (Addendum)
 Mild intermittent thrombocytosis [470- 500]-suggestive of reactive/iron deficiency rather than essential thrombocytosis.  Patient is asymptomatic.  Clinically suggestive for REACTIVE rather than malignancy.  Jak 2 BCR ABL; MPL; CALR mutation- NEGATIVE.  HOLD off bone marrow  # ? Iron def-[? mentruation] ferritin- 26- continue gentle iron [iron biglycinate; 28 mg ] 1 pill a day.   HOLD off venofer for now.   # Vitamin D  deficiency on vitamin D  50,000 units [PCP]  # DISPOSITION: # follow up in 12 months- MD; labs- cbc/cmp; iron studies; ferritin-Dr.B

## 2024-06-24 NOTE — Progress Notes (Signed)
 Fatigue: YES Headaches: YES Joint pain: BACK Bleeding from gums/nose:  NO

## 2024-06-24 NOTE — Progress Notes (Signed)
 [facility] CONSULT NOTE  Patient Care Team: Wallace Joesph LABOR, PA as PCP - General (Family Medicine) Rennie Cindy SAUNDERS, MD as Consulting Physician (Oncology) Janit Alm Agent, MD (Inactive) as Consulting Physician (Obstetrics and Gynecology)  CHIEF COMPLAINTS/PURPOSE OF CONSULTATION: Thrombocytosis.   HEMATOLOGY HISTORY  # THROMBOCYTOSIS [platelets- ; Hb; white count]; CT/US -   HISTORY OF PRESENTING ILLNESS: Patient ambulating-independently. Accompanied by mother.   Shannon Foley 38 y.o.  female pleasant patient is here for follow-up reviewed the blood work ordered for thrombocytosis.  Appetite is good . No weight loss or night sweats no recurrent fevers. No cough or shortness of breath or chest pain.   US  abdomen: 2023-fatty liver no splenomegaly.    Review of Systems  Constitutional:  Negative for chills, diaphoresis, fever, malaise/fatigue and weight loss.  HENT:  Negative for nosebleeds and sore throat.   Eyes:  Negative for double vision.  Respiratory:  Negative for cough, hemoptysis, sputum production, shortness of breath and wheezing.   Cardiovascular:  Negative for chest pain, palpitations, orthopnea and leg swelling.  Gastrointestinal:  Negative for abdominal pain, blood in stool, constipation, diarrhea, heartburn, melena, nausea and vomiting.  Genitourinary:  Negative for dysuria, frequency and urgency.  Musculoskeletal:  Negative for back pain and joint pain.  Skin: Negative.  Negative for itching and rash.  Neurological:  Negative for dizziness, tingling, focal weakness, weakness and headaches.  Endo/Heme/Allergies:  Does not bruise/bleed easily.  Psychiatric/Behavioral:  Negative for depression. The patient is not nervous/anxious and does not have insomnia.      MEDICAL HISTORY:  Past Medical History:  Diagnosis Date   Back pain    mid and lower back   Chronic abdominal pain    Chronic constipation    CIN I (cervical intraepithelial  neoplasia I) 02/13/2019   On 02/2017 colposcopy     Endometrial polyp    Fibroids    one submucosal and one intramural (both  < 1 inch in size)   Headache    caused by pituitary cyst   IBS (irritable bowel syndrome)    Low grade squamous intraepithelial lesion (LGSIL) on cervical Pap smear 05/27/2018   With positive HRHPV     Missed abortion    Morbid obesity with BMI of 40.0-44.9, adult (HCC)    Motion sickness cars   Pituitary cyst    Rathke's pouch cyst   Vitamin B12 deficiency    Vitamin D  deficiency     SURGICAL HISTORY: Past Surgical History:  Procedure Laterality Date   BREAST BIOPSY Right 06/29/2023   US  RT BREAST BX W LOC DEV 1ST LESION IMG BX SPEC US  GUIDE 06/29/2023 [facility]   COLONOSCOPY     CYSTOSCOPY  05/10/2016   WNL   DILATION AND EVACUATION N/A 04/13/2020   Procedure: SUCTION DILATION AND CURETTAGE;  Surgeon: Lake Read, MD;  Location: [facility];  Service: Gynecology;  Laterality: N/A;   ESOPHAGOGASTRODUODENOSCOPY (EGD) WITH PROPOFOL  N/A 02/18/2016   Procedure: ESOPHAGOGASTRODUODENOSCOPY (EGD) WITH PROPOFOL ;  Surgeon: Rogelia Copping, MD;  Location: [facility];  Service: Endoscopy;  Laterality: N/A;   HYSTEROSCOPY WITH D & C N/A 12/11/2022   Procedure: HYSTEROSCOPY D&C;  Surgeon: Janit Alm Agent, MD;  Location: [facility];  Service: Gynecology;  Laterality: N/A;   TONSILLECTOMY  2015    SOCIAL HISTORY: Social History   Socioeconomic History   Marital status: Single    Spouse name: Not on file   Number of children: 0   Years of education:  14   Highest education level: Not on file  Occupational History    Comment: [employer]  Tobacco Use   Smoking status: Never    Passive exposure: Never   Smokeless tobacco: Never  Vaping Use   Vaping status: Never Used  Substance and Sexual Activity   Alcohol use: No   Drug use: No   Sexual activity: Not Currently    Birth control/protection: None  Other Topics Concern   Not on file   Social History Narrative   Lives with mother   Social Drivers of Health   Financial Resource Strain: Not on file  Food Insecurity: No Food Insecurity (10/19/2023)   Hunger Vital Sign    Worried About Running Out of Food in the Last Year: Never true    Ran Out of Food in the Last Year: Never true  Transportation Needs: No Transportation Needs (10/19/2023)   PRAPARE - Administrator, Civil Service (Medical): No    Lack of Transportation (Non-Medical): No  Physical Activity: Not on file  Stress: Not on file  Social Connections: Not on file  Intimate Partner Violence: Not At Risk (10/19/2023)   Humiliation, Afraid, Rape, and Kick questionnaire    Fear of Current or Ex-Partner: No    Emotionally Abused: No    Physically Abused: No    Sexually Abused: No    FAMILY HISTORY: Family History  Problem Relation Age of Onset   Diabetes Mother    Hypertension Mother    Congestive Heart Failure Mother    CVA Father        Had meningioma   Diabetes Father    Hypertension Father    Bladder Cancer Paternal Aunt    Colon cancer Neg Hx    Esophageal cancer Neg Hx    Stomach cancer Neg Hx    Rectal cancer Neg Hx     ALLERGIES:  has no known allergies.  MEDICATIONS:  Current Outpatient Medications  Medication Sig Dispense Refill   fluticasone  (FLONASE ) 50 MCG/ACT nasal spray Place 2 sprays into both nostrils daily. 18.2 mL 1   Vitamin D , Ergocalciferol , (DRISDOL ) 1.25 MG (50000 UNIT) CAPS capsule TAKE 1 CAPSULE BY MOUTH EVERY 7 DAYS 12 capsule 0   No current facility-administered medications for this visit.     PHYSICAL EXAMINATION:   Vitals:   06/24/24 1002  BP: 134/87  Pulse: 71  Resp: 16  Temp: (!) 97.4 F (36.3 C)  SpO2: 98%   Filed Weights   06/24/24 1002  Weight: 242 lb 4.8 oz (109.9 kg)    Physical Exam Vitals and nursing note reviewed.  HENT:     Head: Normocephalic and atraumatic.     Mouth/Throat:     Pharynx: Oropharynx is clear.  Eyes:      Extraocular Movements: Extraocular movements intact.     Pupils: Pupils are equal, round, and reactive to light.  Cardiovascular:     Rate and Rhythm: Normal rate and regular rhythm.  Pulmonary:     Comments: Decreased breath sounds bilaterally.  Abdominal:     Palpations: Abdomen is soft.  Musculoskeletal:        General: Normal range of motion.     Cervical back: Normal range of motion.  Skin:    General: Skin is warm.  Neurological:     General: No focal deficit present.     Mental Status: She is alert and oriented to person, place, and time.  Psychiatric:  Behavior: Behavior normal.        Judgment: Judgment normal.      LABORATORY DATA:  I have reviewed the data as listed Lab Results  Component Value Date   WBC 8.9 06/24/2024   HGB 13.7 06/24/2024   HCT 40.0 06/24/2024   MCV 88.5 06/24/2024   PLT 421 (H) 06/24/2024   Recent Labs    09/28/23 0911 06/24/24 1002  NA 139 135  K 4.7 3.8  CL 103 106  CO2 22 22  GLUCOSE 84 92  BUN 12 9  CREATININE 0.75 0.85  CALCIUM 9.2 8.6*  GFRNONAA  --  >60  PROT 7.4 7.6  ALBUMIN 4.2 3.9  AST 14 29  ALT 8 21  ALKPHOS 103 82  BILITOT 0.3 0.7     US  PELVIC COMPLETE WITH TRANSVAGINAL Result Date: 06/17/2024 CLINICAL DATA:  Initial evaluation for oligomenorrhea. EXAM: TRANSABDOMINAL AND TRANSVAGINAL ULTRASOUND OF PELVIS TECHNIQUE: Both transabdominal and transvaginal ultrasound examinations of the pelvis were performed. Transabdominal technique was performed for global imaging of the pelvis including uterus, ovaries, adnexal regions, and pelvic cul-de-sac. It was necessary to proceed with endovaginal exam following the transabdominal exam to visualize the endometrium. COMPARISON:  Ultrasound from 10/05/2023 as well as earlier studies. FINDINGS: Uterus Measurements: 8.1 x 5.7 x 5.7 cm = volume: 136.9 mL. Uterus is anteverted. Three distinct fibroids are again seen as follows; 1. 3.6 x 3.0 x 3.5 cm intramural to submucosal  fibroid present at the posterior uterine body. 2. 1.1 x 1.0 x 1.1 cm subserosal/exophytic fibroid extends from the uterine fundus. 3. 2.6 x 2.8 x 3.3 cm subserosal fibroid at the anterior uterine body. Endometrium Thickness: 10 mm. Endometrial stripe demonstrates internal microcystic change. Endometrial stripe itself is partially deformed by the adjacent submucosal fibroid. Well defined endometrial-myometrial interface. No visible focal abnormality or abnormal vascularity. Right ovary Measurements: 2.5 x 1.1 x 2.0 cm = volume: 2.8 mL. Normal appearance/no adnexal mass. Left ovary Measurements: 2.7 x 1.4 x 2.1 cm = volume: 4.1 mL. Normal appearance/no adnexal mass. Other findings No abnormal free fluid. IMPRESSION: 1. Three distinct uterine fibroids measuring up to 3.6 cm as detailed above. 2. Microcystic changes within the endometrial complex, nonspecific, but can be seen the setting of endometrial hyperplasia. No visible endometrial polyp or other focal abnormality. Appearance is similar as compared to previous studies. 3. Normal sonographic appearance of the ovaries. No adnexal mass or free fluid. Electronically Signed   By: Morene Hoard M.D.   On: 06/17/2024 04:27    ASSESSMENT & PLAN:   Thrombocytosis Mild intermittent thrombocytosis [470- 500]-suggestive of reactive/iron deficiency rather than essential thrombocytosis.  Patient is asymptomatic.  Clinically suggestive for REACTIVE rather than malignancy.  Jak 2 BCR ABL; MPL; CALR mutation- NEGATIVE.  HOLD off bone marrow  # ? Iron def-[? mentruation] ferritin- 26- continue gentle iron [iron biglycinate; 28 mg ] 1 pill a day.   HOLD off venofer for now.   # Vitamin D  deficiency on vitamin D  50,000 units [PCP]  # DISPOSITION: # follow up in 12 months- MD; labs- cbc/cmp; iron studies; ferritin-Dr.B       Cindy JONELLE Joe, MD 06/24/2024 11:18 AM

## 2024-06-26 LAB — SURGICAL PATHOLOGY

## 2024-07-23 ENCOUNTER — Other Ambulatory Visit: Payer: Self-pay | Admitting: Obstetrics

## 2024-07-23 DIAGNOSIS — N939 Abnormal uterine and vaginal bleeding, unspecified: Secondary | ICD-10-CM

## 2024-07-24 NOTE — Progress Notes (Deleted)
    GYNECOLOGY PROGRESS NOTE  Subjective:  PCP: Wallace Joesph LABOR, PA  Patient ID: Cleda LITTIE Meals, female    DOB: [birth year], 38 y.o.   MRN: [id number]  HPI  Patient is a 38 y.o. G53P0010 female who presents for endometrial biopsy results.  Biopsy performed 06/24/24 due to oligomenorrhea, endometrial hyperplasia and uterine leiomyoma.  {Common ambulatory SmartLinks:19316}  Review of Systems {ros; complete:30496}   Objective:   There were no vitals taken for this visit. There is no height or weight on file to calculate BMI.  General appearance: {general exam:16600} Abdomen: {abdominal exam:16834} Pelvic: {pelvic exam:16852::cervix normal in appearance,external genitalia normal,no adnexal masses or tenderness,no cervical motion tenderness,rectovaginal septum normal,uterus normal size, shape, and consistency,vagina normal without discharge} Extremities: {extremity exam:5109} Neurologic: {neuro exam:17854}  06/24/24 Endometrial Biopsy:  FINAL MICROSCOPIC DIAGNOSIS:   A. ENDOMETRIUM, BIOPSY:  - Fragments suggestive of benign endometrial polyp.   Assessment/Plan:   No diagnosis found.   There are no diagnoses linked to this encounter.     Estil Mangle, DO [facility]

## 2024-07-29 ENCOUNTER — Ambulatory Visit: Admitting: Obstetrics

## 2024-08-13 NOTE — Progress Notes (Deleted)
    GYNECOLOGY PROGRESS NOTE  Subjective:  PCP: Wallace Joesph LABOR, PA  Patient ID: Shannon Foley, female    DOB: Jan 15, 1986, 38 y.o.   MRN: [id number]  HPI Patient is a 38 y.o. G1P0010 with PMH of AUB due to fibroids/endometrial polyps, and benign EH s/p D&C (11/2022) and Megace , then COCs, who presents for EMB results obtained 06/24/24.  TVUS 06/16/24 showed fibroids and microcystic changes in the endometrium.  She had been amenorrheic since April 2025, but had a successful withdrawal bleed with Provera  in September.  {Common ambulatory SmartLinks:19316}  Review of Systems {ros; complete:30496}   Objective:   There were no vitals taken for this visit. There is no height or weight on file to calculate BMI.  General appearance: {general exam:16600} Abdomen: {abdominal exam:16834} Pelvic: {pelvic exam:16852::cervix normal in appearance,external genitalia normal,no adnexal masses or tenderness,no cervical motion tenderness,rectovaginal septum normal,uterus normal size, shape, and consistency,vagina normal without discharge} Extremities: {extremity exam:5109} Neurologic: {neuro exam:17854}  06/24/24 Endometrial Biopsy:  FINAL MICROSCOPIC DIAGNOSIS:   A. ENDOMETRIUM, BIOPSY:  - Fragments suggestive of benign endometrial polyp.   Assessment/Plan:   No diagnosis found.   There are no diagnoses linked to this encounter.     Estil Mangle, DO [facility]

## 2024-08-27 ENCOUNTER — Ambulatory Visit: Admitting: Obstetrics

## 2024-09-23 NOTE — Progress Notes (Signed)
" ° ° °  GYNECOLOGY PROGRESS NOTE  Subjective:  PCP: Wallace Joesph LABOR, PA  Patient ID: Shannon Foley, female    DOB: [birth year], 39 y.o.   MRN: [id number]  HPI Patient is a 40 y.o. G1P0010 with PMH of AUB due to fibroids/endometrial polyps, and benign endometrial hyperplasia s/p D&C (11/2022) then Megace , then COCs, who presents for EMB results obtained 06/24/24.  TVUS 06/16/24 showed fibroids and microcystic changes in the endometrium.  She had been amenorrheic since April 2025, but had a successful withdrawal bleed with Provera  in September '25.  She says her last period was 06/26/24-07/27/24, but she hasn't bled since. Is not taking Provera  any more. Pt still endorses desire for pregnancy, does not have a partner and has not scheduled with REI as previously recommended.   She also has been experiencing lower abdominal tenderness to touch.  She thinks it is a UTI.  Denies dysuria but does report low back pain for a few days.  She would like to be checked for a UTI.  The following portions of the patient's history were reviewed and updated as appropriate: allergies, current medications, past family history, past medical history, past social history, past surgical history, and problem list.  Review of Systems Pertinent items are noted in HPI.   Objective:   Blood pressure (!) 141/93, pulse 93, weight 248 lb (112.5 kg), last menstrual period 06/26/2024. Body mass index is 43.93 kg/m.  General appearance: alert and cooperative Abdomen: soft, non-tender; bowel sounds normal; no masses,  no organomegaly Pelvic: cervix normal in appearance, external genitalia normal, no adnexal masses or tenderness, no cervical motion tenderness, rectovaginal septum normal, uterus normal size, shape, and consistency, and vagina normal without discharge Extremities: extremities normal, atraumatic, no cyanosis or edema Neurologic: Grossly normal  06/24/24 Endometrial Biopsy:  FINAL MICROSCOPIC DIAGNOSIS:   A.  ENDOMETRIUM, BIOPSY:  - Fragments suggestive of benign endometrial polyp.   Assessment/Plan:   1. Oligomenorrhea, unspecified type   2. Primary hypertension   3. Fibroids   4. Abnormal uterine bleeding (AUB)   5. BMI 40.0-44.9, adult (HCC)    39 y.o. G1P0010, PMH of AUB-L, endometrial polyp and benign EH, s/p HSC w/D&C (11/2022) with subsequent Megace , then COCs, with amenorrhea from Apr '25 until Sep '25, after period induction with Provera . Natural period Oct-Nov '25 and TVUS shows pt's fibroids as above, and microcystic changes in the endometrium that could be indicative of EH, EMB benign. Pt restarted on Provera  and with a natural period Oct-Nov '25, but not taking Provera  as directed. Reiterated necessity to take Provera  every 3 mos to induce a period and she agrees to this. Can also take monthly if easier to remember. Rx renewed. Also encouraged her to follow through with REI, and reiterated likelihood of contrary news, as her uterus is currently not healthy as a womb and her overall physical health is poor.   HTN: BP elevated on 2nd occasion with me, now meets criteria for HTN. Pt agrees to discuss with her PCP. UA normal, no UTI today  F/u 6 mos for periods, sooner prn   I personally spent a total of 37 minutes in the care of the patient today including preparing to see the patient, getting/reviewing separately obtained history, performing a medically appropriate exam/evaluation, counseling and educating, referring and communicating with other health care professionals, documenting clinical information in the EHR, and communicating results.   Estil Mangle, DO [facility] "

## 2024-09-30 ENCOUNTER — Encounter: Payer: Self-pay | Admitting: Obstetrics

## 2024-09-30 ENCOUNTER — Ambulatory Visit: Payer: Self-pay | Admitting: Obstetrics

## 2024-09-30 VITALS — BP 141/93 | HR 93 | Wt 248.0 lb

## 2024-09-30 DIAGNOSIS — N915 Oligomenorrhea, unspecified: Secondary | ICD-10-CM

## 2024-09-30 DIAGNOSIS — I10 Essential (primary) hypertension: Secondary | ICD-10-CM | POA: Diagnosis not present

## 2024-09-30 DIAGNOSIS — N939 Abnormal uterine and vaginal bleeding, unspecified: Secondary | ICD-10-CM

## 2024-09-30 DIAGNOSIS — D259 Leiomyoma of uterus, unspecified: Secondary | ICD-10-CM | POA: Diagnosis not present

## 2024-09-30 DIAGNOSIS — Z6841 Body Mass Index (BMI) 40.0 and over, adult: Secondary | ICD-10-CM

## 2024-09-30 DIAGNOSIS — D219 Benign neoplasm of connective and other soft tissue, unspecified: Secondary | ICD-10-CM

## 2024-10-03 ENCOUNTER — Encounter: Payer: Medicaid Other | Admitting: Family Medicine

## 2024-10-16 ENCOUNTER — Encounter: Payer: Self-pay | Admitting: Obstetrics

## 2025-06-23 ENCOUNTER — Other Ambulatory Visit

## 2025-06-23 ENCOUNTER — Ambulatory Visit: Admitting: Internal Medicine
